# Patient Record
Sex: Female | Born: 1975 | Race: Black or African American | Hispanic: No | Smoking: Never smoker
Health system: Southern US, Community
[De-identification: ages and names within clinical notes are randomized; demographics above are authoritative.]

## PROBLEM LIST (undated history)

## (undated) DIAGNOSIS — R739 Hyperglycemia, unspecified: Secondary | ICD-10-CM

## (undated) DIAGNOSIS — E785 Hyperlipidemia, unspecified: Secondary | ICD-10-CM

## (undated) DIAGNOSIS — Z992 Dependence on renal dialysis: Secondary | ICD-10-CM

## (undated) DIAGNOSIS — R5383 Other fatigue: Secondary | ICD-10-CM

## (undated) DIAGNOSIS — E119 Type 2 diabetes mellitus without complications: Secondary | ICD-10-CM

## (undated) DIAGNOSIS — R609 Edema, unspecified: Secondary | ICD-10-CM

## (undated) DIAGNOSIS — I5032 Chronic diastolic (congestive) heart failure: Secondary | ICD-10-CM

## (undated) DIAGNOSIS — N186 End stage renal disease: Secondary | ICD-10-CM

## (undated) DIAGNOSIS — I10 Essential (primary) hypertension: Secondary | ICD-10-CM

## (undated) HISTORY — DX: Essential (primary) hypertension: I10

## (undated) HISTORY — DX: Hyperglycemia, unspecified: R73.9

## (undated) HISTORY — DX: Hyperlipidemia, unspecified: E78.5

## (undated) HISTORY — PX: INSERTION OF DIALYSIS CATHETER: SHX1324

## (undated) HISTORY — DX: Edema, unspecified: R60.9

## (undated) HISTORY — PX: OTHER SURGICAL HISTORY: SHX169

## (undated) HISTORY — DX: Other fatigue: R53.83

---

## 2004-07-05 ENCOUNTER — Ambulatory Visit (HOSPITAL_COMMUNITY): Admission: RE | Admit: 2004-07-05 | Discharge: 2004-07-05 | Payer: Self-pay | Admitting: Gynecology

## 2004-07-31 ENCOUNTER — Ambulatory Visit (HOSPITAL_COMMUNITY): Admission: RE | Admit: 2004-07-31 | Discharge: 2004-07-31 | Payer: Self-pay | Admitting: Gynecology

## 2004-10-03 ENCOUNTER — Ambulatory Visit (HOSPITAL_COMMUNITY): Admission: RE | Admit: 2004-10-03 | Discharge: 2004-10-03 | Payer: Self-pay | Admitting: Gynecology

## 2004-12-03 ENCOUNTER — Ambulatory Visit (HOSPITAL_COMMUNITY): Admission: RE | Admit: 2004-12-03 | Discharge: 2004-12-03 | Payer: Self-pay | Admitting: Gynecology

## 2005-01-20 ENCOUNTER — Ambulatory Visit (HOSPITAL_COMMUNITY): Admission: RE | Admit: 2005-01-20 | Discharge: 2005-01-20 | Payer: Self-pay | Admitting: Gynecology

## 2005-01-31 ENCOUNTER — Inpatient Hospital Stay (HOSPITAL_COMMUNITY): Admission: RE | Admit: 2005-01-31 | Discharge: 2005-02-03 | Payer: Self-pay | Admitting: Gynecology

## 2007-10-03 ENCOUNTER — Emergency Department: Payer: Self-pay | Admitting: Emergency Medicine

## 2008-12-16 ENCOUNTER — Inpatient Hospital Stay: Payer: Self-pay | Admitting: Internal Medicine

## 2010-12-28 ENCOUNTER — Encounter: Payer: Self-pay | Admitting: Gynecology

## 2011-10-17 ENCOUNTER — Ambulatory Visit: Payer: Self-pay | Admitting: Ophthalmology

## 2013-07-10 ENCOUNTER — Emergency Department: Payer: Self-pay | Admitting: Emergency Medicine

## 2013-07-10 LAB — URINALYSIS, COMPLETE
Bilirubin,UR: NEGATIVE
Glucose,UR: >=500 mg/dL (ref 0–75)
Leukocyte Esterase: NEGATIVE
Nitrite: NEGATIVE
Ph: 5 (ref 4.5–8.0)
Protein: 100
RBC,UR: 1 /HPF (ref 0–5)
Specific Gravity: 1.02 (ref 1.003–1.030)
Squamous Epithelial: 1
WBC UR: 2 /HPF (ref 0–5)

## 2013-07-10 LAB — COMPREHENSIVE METABOLIC PANEL
Albumin: 3.1 g/dL — ABNORMAL LOW (ref 3.4–5.0)
Alkaline Phosphatase: 92 U/L (ref 50–136)
Anion Gap: 6 — ABNORMAL LOW (ref 7–16)
BUN: 17 mg/dL (ref 7–18)
Bilirubin,Total: 0.4 mg/dL (ref 0.2–1.0)
Calcium, Total: 9.3 mg/dL (ref 8.5–10.1)
Chloride: 101 mmol/L (ref 98–107)
Co2: 27 mmol/L (ref 21–32)
Creatinine: 1.01 mg/dL (ref 0.60–1.30)
EGFR (African American): >60
EGFR (Non-African Amer.): >60
Glucose: 387 mg/dL — ABNORMAL HIGH (ref 65–99)
Osmolality: 286 (ref 275–301)
Potassium: 3.9 mmol/L (ref 3.5–5.1)
SGOT(AST): 26 U/L (ref 15–37)
SGPT (ALT): 28 U/L (ref 12–78)
Sodium: 134 mmol/L — ABNORMAL LOW (ref 136–145)
Total Protein: 7.3 g/dL (ref 6.4–8.2)

## 2013-07-10 LAB — CBC
HCT: 32.7 % — ABNORMAL LOW (ref 35.0–47.0)
HGB: 11.5 g/dL — ABNORMAL LOW (ref 12.0–16.0)
MCH: 28.9 pg (ref 26.0–34.0)
MCHC: 35.2 g/dL (ref 32.0–36.0)
MCV: 82 fL (ref 80–100)
Platelet: 253 10*3/uL (ref 150–440)
RBC: 3.98 10*6/uL (ref 3.80–5.20)
RDW: 13 % (ref 11.5–14.5)
WBC: 6.8 10*3/uL (ref 3.6–11.0)

## 2014-08-01 ENCOUNTER — Emergency Department: Payer: Self-pay | Admitting: Emergency Medicine

## 2014-08-10 ENCOUNTER — Ambulatory Visit: Payer: Self-pay | Admitting: Chiropractor

## 2014-08-13 ENCOUNTER — Emergency Department: Payer: Self-pay | Admitting: Emergency Medicine

## 2014-08-13 LAB — CBC WITH DIFFERENTIAL/PLATELET
Basophil #: 0.1 10*3/uL (ref 0.0–0.1)
Basophil %: 0.9 %
Eosinophil #: 0.1 10*3/uL (ref 0.0–0.7)
Eosinophil %: 1.1 %
HCT: 36.3 % (ref 35.0–47.0)
HGB: 11.8 g/dL — ABNORMAL LOW (ref 12.0–16.0)
Lymphocyte #: 2.3 10*3/uL (ref 1.0–3.6)
Lymphocyte %: 24.2 %
MCH: 27.2 pg (ref 26.0–34.0)
MCHC: 32.5 g/dL (ref 32.0–36.0)
MCV: 84 fL (ref 80–100)
Monocyte #: 0.6 x10 3/mm (ref 0.2–0.9)
Monocyte %: 6.3 %
Neutrophil #: 6.5 10*3/uL (ref 1.4–6.5)
Neutrophil %: 67.5 %
Platelet: 402 10*3/uL (ref 150–440)
RBC: 4.34 10*6/uL (ref 3.80–5.20)
RDW: 12.9 % (ref 11.5–14.5)
WBC: 9.6 10*3/uL (ref 3.6–11.0)

## 2014-08-13 LAB — BASIC METABOLIC PANEL
Anion Gap: 8 (ref 7–16)
BUN: 20 mg/dL — ABNORMAL HIGH (ref 7–18)
Calcium, Total: 9.3 mg/dL (ref 8.5–10.1)
Chloride: 100 mmol/L (ref 98–107)
Co2: 25 mmol/L (ref 21–32)
Creatinine: 1.22 mg/dL (ref 0.60–1.30)
EGFR (African American): >60
EGFR (Non-African Amer.): 56 — ABNORMAL LOW
Glucose: 365 mg/dL — ABNORMAL HIGH (ref 65–99)
Osmolality: 284 (ref 275–301)
Potassium: 3.7 mmol/L (ref 3.5–5.1)
Sodium: 133 mmol/L — ABNORMAL LOW (ref 136–145)

## 2014-08-18 LAB — CULTURE, BLOOD (SINGLE)

## 2014-08-24 ENCOUNTER — Encounter: Payer: Self-pay | Admitting: Surgery

## 2014-09-07 ENCOUNTER — Encounter: Payer: Self-pay | Admitting: Surgery

## 2014-09-08 ENCOUNTER — Encounter (HOSPITAL_BASED_OUTPATIENT_CLINIC_OR_DEPARTMENT_OTHER): Payer: Self-pay | Attending: General Surgery

## 2015-02-12 ENCOUNTER — Other Ambulatory Visit: Payer: Self-pay

## 2015-02-13 ENCOUNTER — Other Ambulatory Visit (INDEPENDENT_AMBULATORY_CARE_PROVIDER_SITE_OTHER): Payer: Medicaid Other

## 2015-02-13 ENCOUNTER — Other Ambulatory Visit: Payer: Self-pay

## 2015-02-13 ENCOUNTER — Other Ambulatory Visit: Payer: Self-pay | Admitting: Radiology

## 2015-02-13 DIAGNOSIS — R0602 Shortness of breath: Secondary | ICD-10-CM

## 2015-02-13 DIAGNOSIS — R609 Edema, unspecified: Secondary | ICD-10-CM

## 2015-03-08 ENCOUNTER — Ambulatory Visit (INDEPENDENT_AMBULATORY_CARE_PROVIDER_SITE_OTHER): Payer: Medicaid Other | Admitting: Cardiovascular Disease

## 2015-03-08 ENCOUNTER — Encounter: Payer: Self-pay | Admitting: *Deleted

## 2015-03-08 ENCOUNTER — Encounter: Payer: Self-pay | Admitting: Cardiovascular Disease

## 2015-03-08 VITALS — BP 184/98 | HR 100 | Ht 63.5 in | Wt 180.2 lb

## 2015-03-08 DIAGNOSIS — R9439 Abnormal result of other cardiovascular function study: Secondary | ICD-10-CM

## 2015-03-08 DIAGNOSIS — I10 Essential (primary) hypertension: Secondary | ICD-10-CM

## 2015-03-08 DIAGNOSIS — I5032 Chronic diastolic (congestive) heart failure: Secondary | ICD-10-CM | POA: Diagnosis not present

## 2015-03-08 MED ORDER — CARVEDILOL 6.25 MG PO TABS: 6.2500 mg | ORAL_TABLET | Freq: Two times a day (BID) | ORAL | Status: DC

## 2015-03-08 NOTE — Patient Instructions (Signed)
Your physician has recommended you make the following change in your medication: Start Coreg ( 6.25 mg ) twice a day.   Your physician has requested that you have a renal artery duplex. During this test, an ultrasound is used to evaluate blood flow to the kidneys. Allow one hour for this exam. Do not eat after midnight the day before and avoid carbonated beverages. Take your medications as you usually do.   Your physician recommends that you schedule a follow-up appointment in: 1 month after completion of test.

## 2015-03-12 DIAGNOSIS — I5032 Chronic diastolic (congestive) heart failure: Secondary | ICD-10-CM | POA: Insufficient documentation

## 2015-03-12 DIAGNOSIS — I10 Essential (primary) hypertension: Secondary | ICD-10-CM | POA: Insufficient documentation

## 2015-03-12 NOTE — Assessment & Plan Note (Signed)
Blood pressure is significantly elevated. I added carvedilol as outlined above. I requested renal artery duplex to evaluate for possible renal artery stenosis.

## 2015-03-12 NOTE — Assessment & Plan Note (Signed)
The patient has significant exertional dyspnea. I suspect that this is likely multifactorial with some element of chronic diastolic heart failure. However, there might be some other etiologies for this especially kidney disease given her abnormal creatinine and low albumin. Echocardiogram did show trivial pericardial effusion and bilateral pleural effusions. There is a possibility of significant proteinuria. She is going to see nephrology. Coronary artery disease has not been fully excluded as a reason for her exertional dyspnea given her abnormal EKG and suboptimal stress test. She has multiple risk factors for coronary artery disease. She might require cardiac catheterization in the near future. However, I would like for her blood pressure to be more controlled and also to have her evaluated by nephrology. I added carvedilol 6.25 mg twice daily to her medications.

## 2015-03-12 NOTE — Progress Notes (Signed)
Primary care physician: Dr. Dario Guardian  HPI  This is a 39 year old female who was referred for evaluation of exertional dyspnea and leg edema. She has known history of hypertension, diabetes since she was 39 years old, chronic kidney disease, hypothyroidism and hyper-lipidemia. She has family history of coronary artery disease. Her father died at the age of 36 of myocardial infarction. She is not a smoker and does not drink alcohol. She was noted to have a creatinine of 1.5 and she is going to see nephrology. She was also anemic with a hemoglobin of 10.7. Total protein was low at 5.8 with an albumin of 2.6. She had significant worsening of exertional dyspnea with leg edema. She underwent a treadmill nuclear stress test. It showed no clear evidence of ischemia with ejection fraction of 57%. However, the test was suboptimal due to low level of exercise, hypertensive response and underlying left bundle branch block. She had an echocardiogram last month in our office which showed an ejection fraction of 50-55%, grade 1 diastolic dysfunction, mildly dilated left atrium, trivial pericardial effusion with bilateral pleural effusions.  No Known Allergies   No current outpatient prescriptions on file prior to visit.   No current facility-administered medications on file prior to visit.     Past Medical History  Diagnosis Date  . Edema   . Hyperlipidemia   . Hyperglycemia   . Fatigue   . Irregular heart beat   . Hypertension      History reviewed. No pertinent past surgical history.   Family History  Problem Relation Age of Onset  . Hypertension Mother   . Diabetes Mother   . Hypertension Father   . CVA Father      History   Social History  . Marital Status: Divorced    Spouse Name: N/A  . Number of Children: N/A  . Years of Education: N/A   Occupational History  . Not on file.   Social History Main Topics  . Smoking status: Never Smoker   . Smokeless tobacco: Not on file  .  Alcohol Use: No  . Drug Use: No  . Sexual Activity: Not on file   Other Topics Concern  . Not on file   Social History Narrative     ROS A 10 point review of system was performed. It is negative other than that mentioned in the history of present illness.   PHYSICAL EXAM   BP 184/98 mmHg  Pulse 100  Ht 5' 3.5" (1.613 m)  Wt 180 lb 4 oz (81.761 kg)  BMI 31.43 kg/m2 Constitutional: She is oriented to person, place, and time. She appears well-developed and well-nourished. No distress.  HENT: No nasal discharge.  Head: Normocephalic and atraumatic.  Eyes: Pupils are equal and round. No discharge.  Neck: Normal range of motion. Neck supple. No JVD present. No thyromegaly present.  Cardiovascular: Normal rate, regular rhythm, normal heart sounds. Exam reveals no gallop and no friction rub. No murmur heard.  Pulmonary/Chest: Effort normal and breath sounds normal. No stridor. No respiratory distress. She has no wheezes. She has no rales. She exhibits no tenderness.  Abdominal: Soft. Bowel sounds are normal. She exhibits no distension. There is no tenderness. There is no rebound and no guarding.  Musculoskeletal: Normal range of motion. She exhibits +1 edema and no tenderness.  Neurological: She is alert and oriented to person, place, and time. Coordination normal.  Skin: Skin is warm and dry. No rash noted. She is not diaphoretic. No erythema. No  pallor.  Psychiatric: She has a normal mood and affect. Her behavior is normal. Judgment and thought content normal.     ZOX:WRUEAEKG:Sinus  Rhythm  Diffuse low voltage.   -Intraventricular conduction defect and left axis -possible anterior fascicular block   consider ventricular hypertrophy.   -Poor R-wave progression -may be secondary to pulmonary disease   consider old anterior infarct.    ASSESSMENT AND PLAN

## 2015-04-12 ENCOUNTER — Encounter: Payer: Self-pay | Admitting: *Deleted

## 2015-04-13 ENCOUNTER — Telehealth: Payer: Self-pay | Admitting: Cardiovascular Disease

## 2015-04-16 ENCOUNTER — Encounter: Payer: Self-pay | Admitting: *Deleted

## 2015-04-16 ENCOUNTER — Ambulatory Visit: Payer: Medicaid Other | Admitting: Cardiovascular Disease

## 2015-10-10 ENCOUNTER — Inpatient Hospital Stay
Admission: EM | Admit: 2015-10-10 | Discharge: 2015-11-02 | DRG: 291 | Disposition: A | Discharge status: Home / Self Care | Payer: Medicaid Other | Attending: Internal Medicine | Admitting: Internal Medicine

## 2015-10-10 ENCOUNTER — Emergency Department: Payer: Medicaid Other

## 2015-10-10 ENCOUNTER — Encounter: Payer: Self-pay | Admitting: Emergency Medicine

## 2015-10-10 ENCOUNTER — Inpatient Hospital Stay: Payer: Medicaid Other

## 2015-10-10 DIAGNOSIS — R188 Other ascites: Secondary | ICD-10-CM | POA: Diagnosis present

## 2015-10-10 DIAGNOSIS — Z823 Family history of stroke: Secondary | ICD-10-CM

## 2015-10-10 DIAGNOSIS — N179 Acute kidney failure, unspecified: Secondary | ICD-10-CM

## 2015-10-10 DIAGNOSIS — I132 Hypertensive heart and chronic kidney disease with heart failure and with stage 5 chronic kidney disease, or end stage renal disease: Principal | ICD-10-CM | POA: Diagnosis present

## 2015-10-10 DIAGNOSIS — E1059 Type 1 diabetes mellitus with other circulatory complications: Secondary | ICD-10-CM | POA: Diagnosis not present

## 2015-10-10 DIAGNOSIS — R531 Weakness: Secondary | ICD-10-CM | POA: Diagnosis present

## 2015-10-10 DIAGNOSIS — E1022 Type 1 diabetes mellitus with diabetic chronic kidney disease: Secondary | ICD-10-CM | POA: Diagnosis present

## 2015-10-10 DIAGNOSIS — J40 Bronchitis, not specified as acute or chronic: Secondary | ICD-10-CM | POA: Diagnosis present

## 2015-10-10 DIAGNOSIS — E1021 Type 1 diabetes mellitus with diabetic nephropathy: Secondary | ICD-10-CM | POA: Diagnosis present

## 2015-10-10 DIAGNOSIS — I472 Ventricular tachycardia: Secondary | ICD-10-CM | POA: Diagnosis present

## 2015-10-10 DIAGNOSIS — Z8249 Family history of ischemic heart disease and other diseases of the circulatory system: Secondary | ICD-10-CM | POA: Diagnosis not present

## 2015-10-10 DIAGNOSIS — I5023 Acute on chronic systolic (congestive) heart failure: Secondary | ICD-10-CM | POA: Diagnosis not present

## 2015-10-10 DIAGNOSIS — L89510 Pressure ulcer of right ankle, unstageable: Secondary | ICD-10-CM | POA: Diagnosis present

## 2015-10-10 DIAGNOSIS — J384 Edema of larynx: Secondary | ICD-10-CM | POA: Diagnosis present

## 2015-10-10 DIAGNOSIS — I340 Nonrheumatic mitral (valve) insufficiency: Secondary | ICD-10-CM | POA: Diagnosis not present

## 2015-10-10 DIAGNOSIS — E10622 Type 1 diabetes mellitus with other skin ulcer: Secondary | ICD-10-CM | POA: Diagnosis present

## 2015-10-10 DIAGNOSIS — R32 Unspecified urinary incontinence: Secondary | ICD-10-CM | POA: Diagnosis present

## 2015-10-10 DIAGNOSIS — Z833 Family history of diabetes mellitus: Secondary | ICD-10-CM

## 2015-10-10 DIAGNOSIS — I872 Venous insufficiency (chronic) (peripheral): Secondary | ICD-10-CM | POA: Diagnosis present

## 2015-10-10 DIAGNOSIS — E039 Hypothyroidism, unspecified: Secondary | ICD-10-CM | POA: Diagnosis present

## 2015-10-10 DIAGNOSIS — I469 Cardiac arrest, cause unspecified: Secondary | ICD-10-CM | POA: Diagnosis not present

## 2015-10-10 DIAGNOSIS — R601 Generalized edema: Secondary | ICD-10-CM | POA: Insufficient documentation

## 2015-10-10 DIAGNOSIS — D509 Iron deficiency anemia, unspecified: Secondary | ICD-10-CM | POA: Diagnosis present

## 2015-10-10 DIAGNOSIS — D631 Anemia in chronic kidney disease: Secondary | ICD-10-CM | POA: Diagnosis present

## 2015-10-10 DIAGNOSIS — L89329 Pressure ulcer of left buttock, unspecified stage: Secondary | ICD-10-CM | POA: Diagnosis present

## 2015-10-10 DIAGNOSIS — E1065 Type 1 diabetes mellitus with hyperglycemia: Secondary | ICD-10-CM | POA: Diagnosis present

## 2015-10-10 DIAGNOSIS — N186 End stage renal disease: Secondary | ICD-10-CM | POA: Diagnosis present

## 2015-10-10 DIAGNOSIS — I5031 Acute diastolic (congestive) heart failure: Secondary | ICD-10-CM | POA: Diagnosis not present

## 2015-10-10 DIAGNOSIS — R404 Transient alteration of awareness: Secondary | ICD-10-CM | POA: Diagnosis not present

## 2015-10-10 DIAGNOSIS — I5033 Acute on chronic diastolic (congestive) heart failure: Secondary | ICD-10-CM | POA: Insufficient documentation

## 2015-10-10 DIAGNOSIS — R4182 Altered mental status, unspecified: Secondary | ICD-10-CM

## 2015-10-10 DIAGNOSIS — E10649 Type 1 diabetes mellitus with hypoglycemia without coma: Secondary | ICD-10-CM | POA: Diagnosis present

## 2015-10-10 DIAGNOSIS — J9621 Acute and chronic respiratory failure with hypoxia: Secondary | ICD-10-CM | POA: Diagnosis present

## 2015-10-10 DIAGNOSIS — I4901 Ventricular fibrillation: Secondary | ICD-10-CM | POA: Diagnosis present

## 2015-10-10 DIAGNOSIS — I509 Heart failure, unspecified: Secondary | ICD-10-CM | POA: Diagnosis not present

## 2015-10-10 DIAGNOSIS — Z978 Presence of other specified devices: Secondary | ICD-10-CM

## 2015-10-10 DIAGNOSIS — Z794 Long term (current) use of insulin: Secondary | ICD-10-CM

## 2015-10-10 DIAGNOSIS — E785 Hyperlipidemia, unspecified: Secondary | ICD-10-CM | POA: Diagnosis present

## 2015-10-10 DIAGNOSIS — I5043 Acute on chronic combined systolic (congestive) and diastolic (congestive) heart failure: Secondary | ICD-10-CM

## 2015-10-10 DIAGNOSIS — N049 Nephrotic syndrome with unspecified morphologic changes: Secondary | ICD-10-CM | POA: Diagnosis present

## 2015-10-10 DIAGNOSIS — J9601 Acute respiratory failure with hypoxia: Secondary | ICD-10-CM

## 2015-10-10 DIAGNOSIS — I10 Essential (primary) hypertension: Secondary | ICD-10-CM | POA: Insufficient documentation

## 2015-10-10 DIAGNOSIS — J90 Pleural effusion, not elsewhere classified: Secondary | ICD-10-CM

## 2015-10-10 DIAGNOSIS — J9690 Respiratory failure, unspecified, unspecified whether with hypoxia or hypercapnia: Secondary | ICD-10-CM

## 2015-10-10 HISTORY — DX: Type 2 diabetes mellitus without complications: E11.9

## 2015-10-10 LAB — CREATININE, SERUM
Creatinine, Ser: 3.11 mg/dL — ABNORMAL HIGH (ref 0.44–1.00)
GFR calc non Af Amer: 17 mL/min — ABNORMAL LOW (ref >60)
GFR, EST AFRICAN AMERICAN: 20 mL/min — AB (ref >60)

## 2015-10-10 LAB — CBC
HEMATOCRIT: 31.1 % — AB (ref 35.0–47.0)
HEMOGLOBIN: 9.8 g/dL — AB (ref 12.0–16.0)
MCH: 27.9 pg (ref 26.0–34.0)
MCHC: 31.6 g/dL — ABNORMAL LOW (ref 32.0–36.0)
MCV: 88.1 fL (ref 80.0–100.0)
Platelets: 425 10*3/uL (ref 150–440)
RBC: 3.52 MIL/uL — AB (ref 3.80–5.20)
RDW: 16.6 % — ABNORMAL HIGH (ref 11.5–14.5)
WBC: 6.5 10*3/uL (ref 3.6–11.0)

## 2015-10-10 LAB — CBC WITH DIFFERENTIAL/PLATELET
Basophils Absolute: 0.1 10*3/uL (ref 0–0.1)
Basophils Relative: 1 %
Eosinophils Absolute: 0.1 10*3/uL (ref 0–0.7)
Eosinophils Relative: 2 %
HCT: 31 % — ABNORMAL LOW (ref 35.0–47.0)
Hemoglobin: 9.8 g/dL — ABNORMAL LOW (ref 12.0–16.0)
Lymphocytes Relative: 15 %
Lymphs Abs: 1 10*3/uL (ref 1.0–3.6)
MCH: 27.9 pg (ref 26.0–34.0)
MCHC: 31.7 g/dL — ABNORMAL LOW (ref 32.0–36.0)
MCV: 88.1 fL (ref 80.0–100.0)
Monocytes Absolute: 0.3 10*3/uL (ref 0.2–0.9)
Monocytes Relative: 5 %
Neutro Abs: 4.9 10*3/uL (ref 1.4–6.5)
Neutrophils Relative %: 77 %
Platelets: 410 10*3/uL (ref 150–440)
RBC: 3.52 MIL/uL — ABNORMAL LOW (ref 3.80–5.20)
RDW: 16.3 % — ABNORMAL HIGH (ref 11.5–14.5)
WBC: 6.4 10*3/uL (ref 3.6–11.0)

## 2015-10-10 LAB — HEPATIC FUNCTION PANEL
ALBUMIN: 2.2 g/dL — AB (ref 3.5–5.0)
ALK PHOS: 117 U/L (ref 38–126)
ALT: 37 U/L (ref 14–54)
AST: 30 U/L (ref 15–41)
Bilirubin, Direct: <0.1 mg/dL — ABNORMAL LOW (ref 0.1–0.5)
TOTAL PROTEIN: 6.2 g/dL — AB (ref 6.5–8.1)
Total Bilirubin: 0.5 mg/dL (ref 0.3–1.2)

## 2015-10-10 LAB — TSH: TSH: 4.743 u[IU]/mL — ABNORMAL HIGH (ref 0.350–4.500)

## 2015-10-10 LAB — BASIC METABOLIC PANEL
Anion gap: 8 (ref 5–15)
BUN: 36 mg/dL — ABNORMAL HIGH (ref 6–20)
CO2: 19 mmol/L — ABNORMAL LOW (ref 22–32)
Calcium: 8.6 mg/dL — ABNORMAL LOW (ref 8.9–10.3)
Chloride: 111 mmol/L (ref 101–111)
Creatinine, Ser: 3.16 mg/dL — ABNORMAL HIGH (ref 0.44–1.00)
GFR calc Af Amer: 20 mL/min — ABNORMAL LOW (ref >60)
GFR calc non Af Amer: 17 mL/min — ABNORMAL LOW (ref >60)
Glucose, Bld: 194 mg/dL — ABNORMAL HIGH (ref 65–99)
Potassium: 4.7 mmol/L (ref 3.5–5.1)
Sodium: 138 mmol/L (ref 135–145)

## 2015-10-10 LAB — TROPONIN I
Troponin I: <0.03 ng/mL (ref <0.031)
Troponin I: 0.03 ng/mL (ref <0.031)

## 2015-10-10 LAB — MAGNESIUM: Magnesium: 2.1 mg/dL (ref 1.7–2.4)

## 2015-10-10 LAB — CALCIUM: Calcium: 8.7 mg/dL — ABNORMAL LOW (ref 8.9–10.3)

## 2015-10-10 MED ORDER — ONDANSETRON HCL 4 MG PO TABS: 4.0000 mg | ORAL_TABLET | Freq: Four times a day (QID) | ORAL | Status: DC | PRN

## 2015-10-10 MED ORDER — HYDRALAZINE HCL 20 MG/ML IJ SOLN: 10.0000 mg | Freq: Four times a day (QID) | INTRAMUSCULAR | Status: DC | PRN

## 2015-10-10 MED ORDER — INSULIN GLARGINE 100 UNIT/ML ~~LOC~~ SOLN
12.0000 [IU] | Freq: Every day | SUBCUTANEOUS | Status: DC
  Administered 2015-10-11 – 2015-10-16 (×4): 12 [IU] via SUBCUTANEOUS
  Filled 2015-10-10 (×10): qty 0.12

## 2015-10-10 MED ORDER — SODIUM CHLORIDE 0.9 % IV SOLN: 250.0000 mL | INTRAVENOUS | Status: DC | PRN

## 2015-10-10 MED ORDER — ACETAMINOPHEN 325 MG PO TABS
650.0000 mg | ORAL_TABLET | Freq: Four times a day (QID) | ORAL | Status: DC | PRN
  Administered 2015-10-19: 650 mg via ORAL
  Filled 2015-10-10: qty 2

## 2015-10-10 MED ORDER — HEPARIN SODIUM (PORCINE) 5000 UNIT/ML IJ SOLN
5000.0000 [IU] | Freq: Three times a day (TID) | INTRAMUSCULAR | Status: DC
  Administered 2015-10-10 – 2015-10-11 (×2): 5000 [IU] via SUBCUTANEOUS
  Filled 2015-10-10 (×2): qty 1

## 2015-10-10 MED ORDER — SODIUM CHLORIDE 0.9 % IJ SOLN
3.0000 mL | Freq: Two times a day (BID) | INTRAMUSCULAR | Status: DC
  Administered 2015-10-10 – 2015-10-11 (×2): 3 mL via INTRAVENOUS

## 2015-10-10 MED ORDER — FUROSEMIDE 10 MG/ML IJ SOLN
40.0000 mg | Freq: Two times a day (BID) | INTRAMUSCULAR | Status: DC
  Administered 2015-10-11: 40 mg via INTRAVENOUS
  Filled 2015-10-10: qty 4

## 2015-10-10 MED ORDER — ACETAMINOPHEN 650 MG RE SUPP: 650.0000 mg | Freq: Four times a day (QID) | RECTAL | Status: DC | PRN

## 2015-10-10 MED ORDER — ONDANSETRON HCL 4 MG/2ML IJ SOLN: 4.0000 mg | Freq: Four times a day (QID) | INTRAMUSCULAR | Status: DC | PRN

## 2015-10-10 MED ORDER — NITROGLYCERIN 2 % TD OINT: 1.0000 [in_us] | TOPICAL_OINTMENT | Freq: Four times a day (QID) | TRANSDERMAL | Status: DC

## 2015-10-10 MED ORDER — CARVEDILOL 6.25 MG PO TABS
12.5000 mg | ORAL_TABLET | Freq: Two times a day (BID) | ORAL | Status: DC
  Administered 2015-10-11 – 2015-10-19 (×16): 12.5 mg via ORAL
  Filled 2015-10-10: qty 1
  Filled 2015-10-10: qty 2
  Filled 2015-10-10: qty 1
  Filled 2015-10-10: qty 2
  Filled 2015-10-10: qty 1
  Filled 2015-10-10: qty 2
  Filled 2015-10-10 (×7): qty 1
  Filled 2015-10-10: qty 2
  Filled 2015-10-10: qty 1
  Filled 2015-10-10 (×2): qty 2

## 2015-10-10 MED ORDER — FUROSEMIDE 10 MG/ML IJ SOLN
60.0000 mg | Freq: Once | INTRAMUSCULAR | Status: AC
  Administered 2015-10-10: 60 mg via INTRAVENOUS
  Filled 2015-10-10: qty 8

## 2015-10-10 MED ORDER — SODIUM CHLORIDE 0.9 % IJ SOLN
3.0000 mL | INTRAMUSCULAR | Status: DC | PRN
  Administered 2015-10-11 – 2015-10-31 (×2): 3 mL via INTRAVENOUS
  Filled 2015-10-10 (×2): qty 10

## 2015-10-10 MED ORDER — INSULIN ASPART 100 UNIT/ML ~~LOC~~ SOLN
0.0000 [IU] | Freq: Three times a day (TID) | SUBCUTANEOUS | Status: DC
Start: 2015-10-11 — End: 2015-10-17
  Administered 2015-10-11 – 2015-10-12 (×3): 1 [IU] via SUBCUTANEOUS
  Administered 2015-10-12 – 2015-10-13 (×3): 2 [IU] via SUBCUTANEOUS
  Administered 2015-10-14: 1 [IU] via SUBCUTANEOUS
  Administered 2015-10-14: 2 [IU] via SUBCUTANEOUS
  Administered 2015-10-14: 1 [IU] via SUBCUTANEOUS
  Administered 2015-10-15: 3 [IU] via SUBCUTANEOUS
  Administered 2015-10-15: 2 [IU] via SUBCUTANEOUS
  Administered 2015-10-16 (×2): 1 [IU] via SUBCUTANEOUS
  Filled 2015-10-10: qty 1
  Filled 2015-10-10: qty 5
  Filled 2015-10-10: qty 2
  Filled 2015-10-10: qty 1
  Filled 2015-10-10 (×2): qty 2
  Filled 2015-10-10: qty 1
  Filled 2015-10-10 (×2): qty 2
  Filled 2015-10-10 (×3): qty 1
  Filled 2015-10-10: qty 3

## 2015-10-10 MED ORDER — NITROGLYCERIN 2 % TD OINT
1.0000 [in_us] | TOPICAL_OINTMENT | Freq: Four times a day (QID) | TRANSDERMAL | Status: DC
  Administered 2015-10-10 – 2015-10-12 (×7): 1 [in_us] via TOPICAL
  Filled 2015-10-10 (×7): qty 1

## 2015-10-10 MED ORDER — SODIUM CHLORIDE 0.9 % IJ SOLN
3.0000 mL | Freq: Two times a day (BID) | INTRAMUSCULAR | Status: DC
Start: 2015-10-10 — End: 2015-10-14

## 2015-10-10 NOTE — ED Notes (Signed)
MD at bedside. 

## 2015-10-10 NOTE — ED Notes (Signed)
Resting at present. Friend at bedside

## 2015-10-10 NOTE — H&P (Addendum)
[facility] [place] [facility]   PATIENT NAME: Karen Rich    MR#:  [id number]  DATE OF BIRTH:  10-01-1976  DATE OF ADMISSION:  10/10/2015  PRIMARY CARE PHYSICIAN: Sherrie Mustache, MD   REQUESTING/REFERRING PHYSICIAN:   CHIEF COMPLAINT:   Chief Complaint  Patient presents with  . Leg Swelling  . Ascites    HISTORY OF PRESENT ILLNESS: Cydne Grahn  is a 39 y.o. female with a known history of  diabetes type 2, hyperlipidemia, hypertension, chronic kidney disease stage III who presents to the ED with complaint of swelling of her body diffusely. She reports that she stop taking all her medication including her insulin a few months ago. She also reports that she's been having shortness of breath ongoing for the past few months and is unable to lay flat and has to sleep in a chair. She denies any chest pain fevers or chills. Denies any nausea vomiting or diarrhea        PAST MEDICAL HISTORY:   Past Medical History  Diagnosis Date  . Edema   . Hyperlipidemia   . Hyperglycemia   . Fatigue   . Irregular heart beat   . Hypertension   . Diabetes mellitus without complication (HCC)   . Renal disorder   . Renal failure     stage 3    PAST SURGICAL HISTORY:  Past Surgical History  Procedure Laterality Date  . None      SOCIAL HISTORY:  Social History  Substance Use Topics  . Smoking status: Never Smoker   . Smokeless tobacco: Not on file  . Alcohol Use: No    FAMILY HISTORY:  Family History  Problem Relation Age of Onset  . Hypertension Mother   . Diabetes Mother   . Hypertension Father   . CVA Father     DRUG ALLERGIES: No Known Allergies  REVIEW OF SYSTEMS:   CONSTITUTIONAL: No fever, positive fatigue and weakness.  EYES: No blurred or double vision.  EARS, NOSE, AND THROAT: No tinnitus or ear pain.  RESPIRATORY: No cough, shortness of breath, wheezing or hemoptysis.  CARDIOVASCULAR: No chest pain, positive orthopnea and  edema.  GASTROINTESTINAL: No nausea, vomiting, diarrhea or abdominal pain.  GENITOURINARY: No dysuria, hematuria.  ENDOCRINE: Positive polyuria and nocturia,  HEMATOLOGY: No anemia, easy bruising or bleeding SKIN: No rash or lesion. MUSCULOSKELETAL: No joint pain or arthritis.   NEUROLOGIC: No tingling, numbness, weakness.  PSYCHIATRY: No anxiety or depression.   MEDICATIONS AT HOME: Patient not taking any medications for the past few months Prior to Admission medications   Not on File      PHYSICAL EXAMINATION:   VITAL SIGNS: Blood pressure 155/110, pulse 95, temperature 97.5 F (36.4 C), temperature source Oral, resp. rate 18, last menstrual period 09/08/2015, SpO2 96 %.  GENERAL:  39 y.o.-year-old patient lying in the bed with no acute distress.  EYES: Pupils equal, round, reactive to light and accommodation. No scleral icterus. Extraocular muscles intact.  HEENT: Head atraumatic, normocephalic. Oropharynx and nasopharynx clear.  NECK:  Supple, no jugular venous distention. No thyroid enlargement, no tenderness.  LUNGS: Diminished breath sounds without any rales rhonchi wheezing  CARDIOVASCULAR: S1, S2 normal. No murmurs, rubs, or gallops.  ABDOMEN: Soft, nontender, nondistended. Bowel sounds present. No organomegaly or mass.  EXTREMITIES: 2+ pedal edema, cyanosis, or clubbing.  NEUROLOGIC: Cranial nerves II through XII are intact. Muscle strength 5/5 in all extremities. Sensation intact. Gait not checked.  PSYCHIATRIC: The  patient is alert and oriented x 3.  SKIN: No obvious rash, lesion, or ulcer.   LABORATORY PANEL:   CBC  Recent Labs Lab 10/10/15 1446  WBC 6.4  HGB 9.8*  HCT 31.0*  PLT 410  MCV 88.1  MCH 27.9  MCHC 31.7*  RDW 16.3*  LYMPHSABS 1.0  MONOABS 0.3  EOSABS 0.1  BASOSABS 0.1   ------------------------------------------------------------------------------------------------------------------  Chemistries   Recent Labs Lab 10/10/15 1446  NA  138  K 4.7  CL 111  CO2 19*  GLUCOSE 194*  BUN 36*  CREATININE 3.16*  CALCIUM 8.6*   ------------------------------------------------------------------------------------------------------------------ CrCl cannot be calculated (Unknown ideal weight.). ------------------------------------------------------------------------------------------------------------------ No results for input(s): TSH, T4TOTAL, T3FREE, THYROIDAB in the last 72 hours.  Invalid input(s): FREET3   Coagulation profile No results for input(s): INR, PROTIME in the last 168 hours. ------------------------------------------------------------------------------------------------------------------- No results for input(s): DDIMER in the last 72 hours. -------------------------------------------------------------------------------------------------------------------  Cardiac Enzymes  Recent Labs Lab 10/10/15 1446  TROPONINI <0.03   ------------------------------------------------------------------------------------------------------------------ Invalid input(s): POCBNP  ---------------------------------------------------------------------------------------------------------------  Urinalysis    Component Value Date/Time   COLORURINE Red 07/10/2013 1057   APPEARANCEUR Hazy 07/10/2013 1057   LABSPEC 1.020 07/10/2013 1057   PHURINE 5.0 07/10/2013 1057   GLUCOSEU >=500 07/10/2013 1057   HGBUR 1+ 07/10/2013 1057   BILIRUBINUR Negative 07/10/2013 1057   KETONESUR 1+ 07/10/2013 1057   PROTEINUR 100 mg/dL 69/62/952808/02/2013 41321057   NITRITE Negative 07/10/2013 1057   LEUKOCYTESUR Negative 07/10/2013 1057     RADIOLOGY: Dg Chest 2 View  10/10/2015  CLINICAL DATA:  Short of breath and edema EXAM: CHEST  2 VIEW COMPARISON:  07/10/2013 FINDINGS: Lungs are severely under aerated. Small pleural effusions are noted on the lateral view. There is increased opacity towards the lung bases favored represent volume loss. Developing  airspace disease is not excluded. Pulmonary vasculature is within normal limits. IMPRESSION: Low lung volumes. Small bilateral pleural effusions Bibasilar volume loss. Electronically Signed   By: Jolaine ClickArthur  Hoss M.D.   On: 10/10/2015 15:46    EKG: Orders placed or performed during the hospital encounter of 10/10/15  . ED EKG  . ED EKG    IMPRESSION AND PLAN: Patient is a 39 year old African-American female with diabetes type 2 chronic kidney disease presents with anasarca 1. Generalized anasarca could be related to severe hypoalbuminemia albumin level is currently pending, also could be due to acute CHF type unknown, at this time will treat her with IV Lasix. Nephrology evaluation echocardiogram of the heart.  2. Diabetes type 2 not on any medications: Start on sliding scale insulin low-dose Lantus check a hemoglobin A1c  3. Accelerated hypertension: Start patient on Coreg, hydralazine when necessary Nitropatch.  4. Acute renal failure on chronic kidney disease stage III: We'll obtain a renal ultrasound, suspect due to poorly controlled diabetes and progressive renal disease nephrology consult  5. Hypothyroidism check a TSH  6. Lipidemia check a fasting lipid panel in the a.m.  7. Miscellaneous heparin per DVT prophylaxis      All the records are reviewed and case discussed with ED provider. Management plans discussed with the patient, family and they are in agreement.  CODE STATUS: Code full    TOTAL TIME TAKING CARE OF THIS PATIENT: 55 minutes.    Auburn BilberryPATEL, Tannon Peerson M.D on 10/10/2015 at 6:04 PM  Between 7am to 6pm - Pager - [phone]  After 6pm go to [email or web address] - password [email or web address]  [facility]  Office  [phone]  CC: Primary care physician; Sherrie MustacheFayegh Jadali,  MD     

## 2015-10-10 NOTE — ED Provider Notes (Signed)
[facility] Emergency Department Provider Note   ____________________________________________  Time seen: 2:15 PM I have reviewed the triage vital signs and the triage nursing note.  HISTORY  Chief Complaint Leg Swelling and Ascites   Historian Patient  HPI Karen Rich is a 39 y.o. female who reports a history of problems with her kidneys, and last saw her primary care physician in March or April, who is here with a complaint of shortness of breath and lower sure he swelling which seems to have moved up into her abdomen. Patient states she used to be on how to chlorothiazide, however is not on that currently. Over the past 3 weeks she's had increased lower extremity edema and now abdominal edema. Denies chest pain. No cough. Symptoms not made worse or better by anything.    Past Medical History  Diagnosis Date  . Edema   . Hyperlipidemia   . Hyperglycemia   . Fatigue   . Irregular heart beat   . Hypertension   . Diabetes mellitus without complication (HCC)   . Renal disorder   . Renal failure     stage 3    Patient Active Problem List   Diagnosis Date Noted  . Essential hypertension 03/12/2015  . Chronic diastolic heart failure (HCC) 03/12/2015    History reviewed. No pertinent past surgical history.  No current outpatient prescriptions on file.  Allergies Review of patient's allergies indicates no known allergies.  Family History  Problem Relation Age of Onset  . Hypertension Mother   . Diabetes Mother   . Hypertension Father   . CVA Father     Social History Social History  Substance Use Topics  . Smoking status: Never Smoker   . Smokeless tobacco: None  . Alcohol Use: No    Review of Systems  Constitutional: Negative for fever. Eyes: Negative for visual changes. ENT: Negative for sore throat. Cardiovascular: Negative for chest pain. Respiratory: Positive for shortness of breath. Gastrointestinal: Negative for  abdominal pain, vomiting and diarrhea. Genitourinary: Negative for dysuria. Musculoskeletal: Negative for back pain. Skin: Negative for rash. Neurological: Negative for headache. 10 point Review of Systems otherwise negative ____________________________________________   PHYSICAL EXAM:  VITAL SIGNS: ED Triage Vitals  Enc Vitals Group     BP 10/10/15 1411 194/144 mmHg     Pulse Rate 10/10/15 1411 98     Resp --      Temp 10/10/15 1411 97.5 F (36.4 C)     Temp Source 10/10/15 1411 Oral     SpO2 10/10/15 1411 95 %     Weight --      Height --      Head Cir --      Peak Flow --      Pain Score --      Pain Loc --      Pain Edu? --      Excl. in GC? --      Constitutional: Alert and oriented. Well appearing and in no distress. Eyes: Conjunctivae are normal. PERRL. Normal extraocular movements. ENT   Head: Normocephalic and atraumatic.   Nose: No congestion/rhinnorhea.   Mouth/Throat: Mucous membranes are moist.   Neck: No stridor. Cardiovascular/Chest: Normal rate, regular rhythm.  No murmurs, rubs, or gallops. Respiratory: Tachypnea, no wheeze, ronchi.  Moderate decreased air movement throughout all fields Gastrointestinal: Soft. Somewhat distended. Nontender abdomen with palpation.  No guarding, no rebound.  Genitourinary/rectal:Deferred Musculoskeletal: Nontender with normal range of motion in all extremities. No joint  effusions.  No lower extremity tender 4+ lower extremity pitting edema bilateral lower extremities although it up and to the abdomen. Neuro:  Normal speech and language. No gross or focal neurologic deficits are appreciated. Skin:  Skin is warm, dry and intact. No rash noted. Psychiatric: Mood and affect are normal. Speech and behavior are normal. Patient exhibits appropriate insight and judgment.  ____________________________________________   EKG I, Governor Rooksebecca Angeliah Wisdom, MD, the attending physician have personally viewed and interpreted all  ECGs.  96 bpm. Sinus rhythm. Left bundle branch block. ____________________________________________  LABS (pertinent positives/negatives)  Basic metabolic panel significant for BUN 36 and creatinine 3.16 neck sign troponin less than 0.03 CBC significant for hemoglobin 9.8  ____________________________________________  RADIOLOGY All Xrays were viewed by me. Imaging interpreted by Radiologist.   Chest x-ray two-view: IMPRESSION: Low lung volumes.  Small bilateral pleural effusions  Bibasilar volume loss. __________________________________________  PROCEDURES  Procedure(s) performed: None  Critical Care performed: None  ____________________________________________   ED COURSE / ASSESSMENT AND PLAN  CONSULTATIONS: Face to face with hospitalist for admission  Pertinent labs & imaging results that were available during my care of the patient were reviewed by me and considered in my medical decision making (see chart for details).   This patient clinically looks like she is in volume overload consistent with congestive heart failure, including lower shotty edema as well as elevated blood pressure. Patient was given nitroglycerin paste as well as Lasix based on her clinical impression.  Her laboratory studies show she is in acute renal failure. Patient will be admitted for treatment of acute renal failure as well as volume overload state.  Patient / Family / Caregiver informed of clinical course, medical decision-making process, and agree with plan.   ___________________________________________   FINAL CLINICAL IMPRESSION(S) / ED DIAGNOSES   Final diagnoses:  Acute on chronic congestive heart failure, unspecified congestive heart failure type (HCC)  Acute renal failure, unspecified acute renal failure type (HCC)       [name], MD 10/10/15 1730

## 2015-10-10 NOTE — ED Notes (Signed)
Pt arrived by EMS with C/O edema in her legs and stomach. Pt states she is not compliant with her medications other than occasional use of her metformin. She has hx of HTN, Diabetes and stage 3 renal failure for which she was diagnosed in April.

## 2015-10-11 ENCOUNTER — Inpatient Hospital Stay (HOSPITAL_COMMUNITY)
Admit: 2015-10-11 | Discharge: 2015-10-11 | Disposition: A | Discharge status: Home / Self Care | Payer: Medicaid Other | Attending: Internal Medicine | Admitting: Internal Medicine

## 2015-10-11 DIAGNOSIS — I340 Nonrheumatic mitral (valve) insufficiency: Secondary | ICD-10-CM

## 2015-10-11 LAB — GLUCOSE, CAPILLARY
GLUCOSE-CAPILLARY: 144 mg/dL — AB (ref 65–99)
Glucose-Capillary: 145 mg/dL — ABNORMAL HIGH (ref 65–99)
Glucose-Capillary: 125 mg/dL — ABNORMAL HIGH (ref 65–99)

## 2015-10-11 LAB — PROTEIN / CREATININE RATIO, URINE
Creatinine, Urine: 84 mg/dL
Protein Creatinine Ratio: 4.96 mg/mg{Cre} — ABNORMAL HIGH (ref 0.00–0.15)
Total Protein, Urine: 417 mg/dL

## 2015-10-11 LAB — HEMOGLOBIN A1C: Hgb A1c MFr Bld: 6.6 % — ABNORMAL HIGH (ref 4.0–6.0)

## 2015-10-11 LAB — C DIFFICILE QUICK SCREEN W PCR REFLEX
C DIFFICILE (CDIFF) INTERP: NEGATIVE
C DIFFICILE (CDIFF) TOXIN: NEGATIVE
C DIFFICLE (CDIFF) ANTIGEN: NEGATIVE

## 2015-10-11 LAB — T4, FREE: FREE T4: 1.03 ng/dL (ref 0.61–1.12)

## 2015-10-11 LAB — TROPONIN I: Troponin I: <0.03 ng/mL (ref <0.031)

## 2015-10-11 MED ORDER — FUROSEMIDE 10 MG/ML IJ SOLN
10.0000 mg/h | INTRAVENOUS | Status: DC
  Administered 2015-10-11 – 2015-10-14 (×3): 10 mg/h via INTRAVENOUS
  Filled 2015-10-11 (×4): qty 25

## 2015-10-11 MED ORDER — INSULIN ASPART 100 UNIT/ML ~~LOC~~ SOLN
0.0000 [IU] | Freq: Every day | SUBCUTANEOUS | Status: DC
  Administered 2015-10-15: 2 [IU] via SUBCUTANEOUS
  Filled 2015-10-11 (×2): qty 1

## 2015-10-11 MED ORDER — INSULIN ASPART 100 UNIT/ML ~~LOC~~ SOLN: 0.0000 [IU] | Freq: Three times a day (TID) | SUBCUTANEOUS | Status: DC

## 2015-10-11 MED ORDER — COLLAGENASE 250 UNIT/GM EX OINT
TOPICAL_OINTMENT | Freq: Every day | CUTANEOUS | Status: DC
Start: 2015-10-11 — End: 2015-10-31
  Administered 2015-10-11 – 2015-10-31 (×18): via TOPICAL
  Filled 2015-10-11 (×3): qty 30

## 2015-10-11 MED ORDER — ENOXAPARIN SODIUM 30 MG/0.3ML ~~LOC~~ SOLN
30.0000 mg | SUBCUTANEOUS | Status: DC
  Administered 2015-10-11 – 2015-10-15 (×5): 30 mg via SUBCUTANEOUS
  Filled 2015-10-11 (×5): qty 0.3

## 2015-10-11 NOTE — Progress Notes (Signed)
Pt is alert and oriented x 4, pt denies pain, discomfort experienced with movement due to anasarca, continues on 2 L oxygen, vital signs stable, incontinent of urine at times, loose throughout shift, denies chest pain, echo performed throughout shift, IV lasix infusing, wound care consulted with patient, wound care provided, creatinine level remains elevated, [facility] by assist, uneventful shift.

## 2015-10-11 NOTE — Progress Notes (Signed)
*  PRELIMINARY RESULTS* Echocardiogram 2D Echocardiogram has been performed.  Karen Rich 10/11/2015, 4:39 PM

## 2015-10-11 NOTE — Progress Notes (Signed)
[facility] [place] [facility]   PATIENT NAME: Karen SailsCamille Rich    MR#:  [id number]  DATE OF BIRTH:  November 19, 1976  SUBJECTIVE:  Patient reports over 30 pound weight gain  REVIEW OF SYSTEMS:    Review of Systems  Constitutional: Negative for fever, chills and malaise/fatigue.  HENT: Negative for sore throat.   Eyes: Negative for blurred vision.  Respiratory: Positive for shortness of breath. Negative for cough, hemoptysis and wheezing.   Cardiovascular: Positive for leg swelling and PND. Negative for chest pain and palpitations.  Gastrointestinal: Negative for nausea, vomiting, abdominal pain, diarrhea and blood in stool.  Genitourinary: Negative for dysuria.  Musculoskeletal: Negative for back pain.  Neurological: Negative for dizziness, tremors and headaches.  Endo/Heme/Allergies: Does not bruise/bleed easily.    Tolerating Diet:yes      DRUG ALLERGIES:  No Known Allergies  VITALS:  Blood pressure 143/87, pulse 95, temperature 98.5 F (36.9 C), temperature source Oral, resp. rate 22, height 5' 3.5" (1.613 m), weight 112.492 kg (248 lb), last menstrual period 09/08/2015, SpO2 100 %.  PHYSICAL EXAMINATION:   Physical Exam    LABORATORY PANEL:   CBC  Recent Labs Lab 10/10/15 2016  WBC 6.5  HGB 9.8*  HCT 31.1*  PLT 425   ------------------------------------------------------------------------------------------------------------------  Chemistries   Recent Labs Lab 10/10/15 1446 10/10/15 2016  NA 138  --   K 4.7  --   CL 111  --   CO2 19*  --   GLUCOSE 194*  --   BUN 36*  --   CREATININE 3.16* 3.11*  CALCIUM 8.6* 8.7*  MG  --  2.1  AST  --  30  ALT  --  37  ALKPHOS  --  117  BILITOT  --  0.5   ------------------------------------------------------------------------------------------------------------------  Cardiac Enzymes  Recent Labs Lab 10/10/15 2016 10/11/15 0150 10/11/15 0725  TROPONINI 0.03 <0.03 <0.03    ------------------------------------------------------------------------------------------------------------------  RADIOLOGY:  Dg Chest 2 View  10/10/2015  CLINICAL DATA:  Short of breath and edema EXAM: CHEST  2 VIEW COMPARISON:  07/10/2013 FINDINGS: Lungs are severely under aerated. Small pleural effusions are noted on the lateral view. There is increased opacity towards the lung bases favored represent volume loss. Developing airspace disease is not excluded. Pulmonary vasculature is within normal limits. IMPRESSION: Low lung volumes. Small bilateral pleural effusions Bibasilar volume loss. Electronically Signed   By: Jolaine ClickArthur  Hoss M.D.   On: 10/10/2015 15:46   [place] Renal  10/10/2015  CLINICAL DATA:  Acute renal failure. EXAM: RENAL / URINARY TRACT ULTRASOUND COMPLETE COMPARISON:  07/10/2013 chest CT. FINDINGS: Right Kidney: Length: 9.3 cm. Echogenic right kidney with no right hydronephrosis and no right renal mass. No right renal cortical atrophy. Left Kidney: Length: 10.7 cm. Echogenic left kidney with no left hydronephrosis and no left renal mass. No left renal cortical atrophy. Incidentally noted is a small left pleural effusion. Bladder: Appears normal for degree of bladder distention. IMPRESSION: 1. No hydronephrosis. 2. Echogenic normal size kidneys, indicating nonspecific renal parenchymal disease of uncertain chronicity. 3. Incidental small left pleural effusion. 4. Normal bladder. Electronically Signed   By: Delbert PhenixJason A Poff M.D.   On: 10/10/2015 18:59     ASSESSMENT AND PLAN:   39 year old female with a history of type 2 diabetes, hyperlipidemia and chronic kidney disease stage III presents to the ER with ascites.   1. Generalized anasarca: This is likely secondary to renal disease. Patient will be started on Lasix drip and input  and output will be monitored. Appreciate nephrology consultation. Echocardiogram is pending to evaluate for possible congestive heart failure. Patient did have  an echocardiogram not too long ago and apparently this was normal.  2. Type 2 diabetes: Continue sliding scale insulin and check hemoglobin A1c. Continue Lantus 12 units at bedtime.  3. Abnormal TSH: Check T3 and T4.  4. Essential hypertension Blood pressure still elevated on Coreg. I will try Lasix gtt and see if this helps to decrease her blood pressure. If not I will  Need to add hydralazine.   Management plans discussed with the patient and she is in agreement.  CODE STATUS: FULL  TOTAL TIME TAKING CARE OF THIS PATIENT: 30 minutes.     POSSIBLE D/C 304 days, DEPENDING ON CLINICAL CONDITION.   Cyler Kappes M.D on 10/11/2015 at 12:45 PM  Between 7am to 6pm - Pager - [phone] After 6pm go to [email or web address] - password [email or web address]  [facility]  Office  [phone]  CC: Primary care physician; Sherrie Mustache, MD  Note: This dictation was prepared with Dragon dictation along with smaller phrase technology. Any transcriptional errors that result from this process are unintentional.

## 2015-10-11 NOTE — Consult Note (Signed)
Date: 10/11/2015                  Patient Name:  Karen Rich  MRN: [id number]  DOB: 04-Oct-1976  Age / Sex: 39 y.o., female         PCP: Sherrie Mustache, MD                 Service Requesting Consult: Internal medicine                 Reason for Consult: Anasarca, acute renal failure            History of Present Illness: Patient is a 39 y.o. female with medical problems of Diabetes type II (since  Age 35, poorly controlled) with severe complications including retinopathy and kidney disease, hypertension, obesity who was admitted to [facility] on 10/10/2015 for evaluation of acute renal failure and anasarca. Patient states that she started to have generalized swelling earlier this year.  She was prescribed diuretics but she decided to discontinue them on her own.  She has known chronic kidney disease which is likely diabetes related.  Patient admits that her diabetes has been poorly controlled over the years. Her baseline creatinine appears to be 1.2, GFR of >60 from September 2015.  No records are available in between. Presenting creatinine is 3.16, today's creatinine is 3.11 corresponding to GFR of 20 Her albumin is 2.2 She has gained almost close to 30 pounds over the year   Medications: Outpatient medications: No prescriptions prior to admission    Current medications: Current Facility-Administered Medications  Medication Dose Route Frequency Provider Last Rate Last Dose  . 0.9 %  sodium chloride infusion  250 mL Intravenous PRN Auburn Bilberry, MD      . acetaminophen (TYLENOL) tablet 650 mg  650 mg Oral Q6H PRN Auburn Bilberry, MD       Or  . acetaminophen (TYLENOL) suppository 650 mg  650 mg Rectal Q6H PRN Auburn Bilberry, MD      . carvedilol (COREG) tablet 12.5 mg  12.5 mg Oral BID WC Auburn Bilberry, MD   12.5 mg at 10/11/15 0908  . collagenase (SANTYL) ointment   Topical Daily Sital Mody, MD      . enoxaparin (LOVENOX) injection 30 mg  30 mg Subcutaneous Q24H Sital Mody, MD       . furosemide (LASIX) 250 mg in dextrose 5 % 250 mL (1 mg/mL) infusion  10 mg/hr Intravenous Continuous Adrian Saran, MD 10 mL/hr at 10/11/15 1333 10 mg/hr at 10/11/15 1333  . hydrALAZINE (APRESOLINE) injection 10 mg  10 mg Intravenous Q6H PRN Auburn Bilberry, MD      . insulin aspart (novoLOG) injection 0-20 Units  0-20 Units Subcutaneous TID WC Sital Mody, MD      . insulin aspart (novoLOG) injection 0-5 Units  0-5 Units Subcutaneous QHS Sital Mody, MD      . insulin aspart (novoLOG) injection 0-9 Units  0-9 Units Subcutaneous TID WC Auburn Bilberry, MD   1 Units at 10/11/15 1152  . insulin glargine (LANTUS) injection 12 Units  12 Units Subcutaneous Daily Auburn Bilberry, MD   12 Units at 10/11/15 0929  . nitroGLYCERIN (NITROGLYN) 2 % ointment 1 inch  1 inch Topical 4 times per day Governor Rooks, MD   1 inch at 10/11/15 1152  . ondansetron (ZOFRAN) tablet 4 mg  4 mg Oral Q6H PRN Auburn Bilberry, MD       Or  . ondansetron [facility]) injection 4  mg  4 mg Intravenous Q6H PRN Auburn BilberryShreyang Patel, MD      . sodium chloride 0.9 % injection 3 mL  3 mL Intravenous Q12H Auburn BilberryShreyang Patel, MD   3 mL at 10/11/15 0011  . sodium chloride 0.9 % injection 3 mL  3 mL Intravenous Q12H Auburn BilberryShreyang Patel, MD   3 mL at 10/11/15 0909  . sodium chloride 0.9 % injection 3 mL  3 mL Intravenous PRN Auburn BilberryShreyang Patel, MD   3 mL at 10/11/15 [id number]      Allergies: No Known Allergies    Past Medical History: Past Medical History  Diagnosis Date  . Edema   . Hyperlipidemia   . Hyperglycemia   . Fatigue   . Irregular heart beat   . Hypertension   . Diabetes mellitus without complication (HCC)   . Renal disorder   . Renal failure     stage 3     Past Surgical History: Past Surgical History  Procedure Laterality Date  . None       Family History: Family History  Problem Relation Age of Onset  . Hypertension Mother   . Diabetes Mother   . Hypertension Father   . CVA Father      Social History: Social History    Social History  . Marital Status: Divorced    Spouse Name: N/A  . Number of Children: N/A  . Years of Education: N/A   Occupational History  . Not on file.   Social History Main Topics  . Smoking status: Never Smoker   . Smokeless tobacco: Not on file  . Alcohol Use: No  . Drug Use: No  . Sexual Activity: Not on file   Other Topics Concern  . Not on file   Social History Narrative     Review of Systems: Gen: no fevers, chills, significant weight gain HEENT: diabetic retinopathy CV: no chest pain has significant, lower extremity edema Resp: reports dyspnea on exertion, no cough or sputum XB:[id number]:reports loose stools, no blood in the urine GU : no problems with voiding MS: no recent reports of joint pains or swelling Derm:  Patient reports multiple excoriations over arms and legs Psych: no complaints Heme: no complaints Neuro: generalized weakness Endocrine.  Diabetes poorly controlled  Vital Signs: Blood pressure 143/86, pulse 89, temperature 98.5 F (36.9 C), temperature source Oral, resp. rate 22, height 5' 3.5" (1.613 m), weight 112.492 kg (248 lb), last menstrual period 09/08/2015, SpO2 98 %.   Intake/Output Summary (Last 24 hours) at 10/11/15 1536 Last data filed at 10/11/15 0830  Gross per 24 hour  Intake      0 ml  Output      0 ml  Net      0 ml    Weight trends: [employer]ights   10/10/15 1939  Weight: 112.492 kg (248 lb)    Physical Exam: General:  alert, oriented, obese  HEENT Moist oral mucous membranes, anicteric sclera  Neck:  supple, no masses  Lungs: Mild rhonchi otherwise clear to auscultation  Heart::  Animas gallops, soft systolic murmur, no rub  Abdomen: Soft, distended, nontender, ascites  Extremities:  3+ generalized edema arms and legs  Neurologic: Alert, oriented  Skin: Excoriations over her arms and legs, previous weeping ulcers healed  Access:   Foley:        Lab results: Basic Metabolic Panel:  Recent Labs Lab  10/10/15 1446 10/10/15 2016  NA 138  --   K 4.7  --  CL 111  --   CO2 19*  --   GLUCOSE 194*  --   BUN 36*  --   CREATININE 3.16* 3.11*  CALCIUM 8.6* 8.7*  MG  --  2.1    Liver Function Tests:  Recent Labs Lab 10/10/15 2016  AST 30  ALT 37  ALKPHOS 117  BILITOT 0.5  PROT 6.2*  ALBUMIN 2.2*   No results for input(s): LIPASE, AMYLASE in the last 168 hours. No results for input(s): AMMONIA in the last 168 hours.  CBC:  Recent Labs Lab 10/10/15 1446 10/10/15 2016  WBC 6.4 6.5  NEUTROABS 4.9  --   HGB 9.8* 9.8*  HCT 31.0* 31.1*  MCV 88.1 88.1  PLT 410 425    Cardiac Enzymes:  Recent Labs Lab 10/11/15 0725  TROPONINI <0.03    BNP: Invalid input(s): POCBNP  CBG:  Recent Labs Lab 10/11/15 1122  GLUCAP 145*    Microbiology: Recent Results (from the past 720 hour(s))  C difficile quick scan w PCR reflex     Status: None   Collection Time: 10/10/15 10:21 PM  Result Value Ref Range Status   C Diff antigen NEGATIVE NEGATIVE Final   C Diff toxin NEGATIVE NEGATIVE Final   C Diff interpretation Negative for C. difficile  Final     Coagulation Studies: No results for input(s): LABPROT, INR in the last 72 hours.  Urinalysis: No results for input(s): COLORURINE, LABSPEC, PHURINE, GLUCOSEU, HGBUR, BILIRUBINUR, KETONESUR, PROTEINUR, UROBILINOGEN, NITRITE, LEUKOCYTESUR in the last 72 hours.  Invalid input(s): APPERANCEUR    Imaging: Dg Chest 2 View  10/10/2015  CLINICAL DATA:  Short of breath and edema EXAM: CHEST  2 VIEW COMPARISON:  07/10/2013 FINDINGS: Lungs are severely under aerated. Small pleural effusions are noted on the lateral view. There is increased opacity towards the lung bases favored represent volume loss. Developing airspace disease is not excluded. Pulmonary vasculature is within normal limits. IMPRESSION: Low lung volumes. Small bilateral pleural effusions Bibasilar volume loss. Electronically Signed   By: Jolaine Click M.D.   On:  10/10/2015 15:46   US Renal  10/10/2015  CLINICAL DATA:  Acute renal failure. EXAM: RENAL / URINARY TRACT ULTRASOUND COMPLETE COMPARISON:  07/10/2013 chest CT. FINDINGS: Right Kidney: Length: 9.3 cm. Echogenic right kidney with no right hydronephrosis and no right renal mass. No right renal cortical atrophy. Left Kidney: Length: 10.7 cm. Echogenic left kidney with no left hydronephrosis and no left renal mass. No left renal cortical atrophy. Incidentally noted is a small left pleural effusion. Bladder: Appears normal for degree of bladder distention. IMPRESSION: 1. No hydronephrosis. 2. Echogenic normal size kidneys, indicating nonspecific renal parenchymal disease of uncertain chronicity. 3. Incidental small left pleural effusion. 4. Normal bladder. Electronically Signed   By: Delbert Phenix M.D.   On: 10/10/2015 18:59      Assessment & Plan: Pt is a 39 y.o. yo female with medical problems of Diabetes type II (since  Age 36, poorly controlled) with severe complications including retinopathy and kidney disease, hypertension, obesity who was admitted to [facility] on 10/10/2015 for evaluation of acute renal failure and anasarca.  1.  Acute renal failure.  Unclear cause. 2.  Chronic kidney disease stage II/III.  Baseline creatinine 1.2/GFR greater than 60 September 2015 3.  Generalized edema and anasarca 4.  Diabetes type 2 with chronic kidney disease  Plan: IV Lasix infusion with albumin support for diuresis Continue to monitor BMP for electrolytes and renal function Hemoglobin A1c pending Avoid  ACE inhibitor at this time

## 2015-10-11 NOTE — Progress Notes (Addendum)
Patient admitted to unit with generalized edema and shortness of breath, denies any pain or discomfort, VSS, [facility], pt on IV lasix, high fall risk. Skin assessment completed with Helmut MusterAlicia, RN.

## 2015-10-11 NOTE — Progress Notes (Signed)
fsbs 138 per NA Boneta LucksJenny

## 2015-10-11 NOTE — Consult Note (Signed)
WOC wound consult note Reason for Consult: Multiple areas of nonintact skin.  Moisture Associated Skin Damage (MASD) to buttocks and perineal area.  Full thickness skin loss to right lateral calf and right lateral malleolus.  Generalized edema. CHF exacerbation, acute.  Wound type:MASD, venous insufficiency to lower extremities. Trauma wound to right malleolus.  Patient states this area opened after walking in a shoe.  Pressure Ulcer POA: Yes right malleolus, unstageable pressure injury Measurement:Right lateral malleolus 1.2 cm x 1 cm unable to visualize wound bed due to slough Right lateral calf 4 cm x 3.2 cm x 0.1 cm wound bed 25% slough MASD to right buttocks 1 cm x 1 cm x 0.1 cm scattered nonintact lesions to perineal area.  Patient reports she has been having loose stools and incontinence.  Hard, nonpitting edema to upper thighs and abdomen present. No skin breakdown under abdominal pannus or under breasts.    Drainage (amount, consistency, odor) Minimal serosanguinous. No odor from wounds.  Periwound:Edema, generalized Dressing procedure/placement/frequency:For now, would like to begin debridement with Santyl ointment.  Once diuretics have removed some excess fluid, will consider compression to lower extremities.  Suggested wound care center consult to patient and she is considering this. Financial and transportation are concerns for her.  Cleanse wounds to right calf and right ankle with NS and pat gently dry.  Apply Santyl to wound bed, 1/8 inch thickness (opaque).  COver with NS moist gauze.  COver with 4x4 gauze, kerlix and tape.  Change daily. Once edema to abdomen has improved, would like to consider Unnas boots.  WOC team will follow Maple HudsonKaren Shmuel Girgis RN BSN [facility] Pager [phone]

## 2015-10-11 NOTE — Care Management (Signed)
Patient presents to ED from home with complaint of shortness of breath.  She has history of diabetes, hypertension, and chronic kidney issues.  She admits to "just stopping all of her medication."  Reason she gives is- she was being referred out to 'so many" different doctors and everyone was changing her medicines that had just been ordered by another doctor.  Says it became too much to deal with and she stopped them all.  Verbalizes understanding that this was not a good decision.  She is currently on 02 and this is acute.  Will need to assess for need of home 02 but hopefully she can be weaned.  She is very agreeable to having a home health nurse come and visit to help her with med administration and deal with MD visit schedule.  [place] and confirms her address and contact information.  Is current with her PCP- Dr Dario GuardianJadali.  She denies having problems accessing medical care or obtaining her medications

## 2015-10-12 LAB — BASIC METABOLIC PANEL
ANION GAP: 5 (ref 5–15)
BUN: 38 mg/dL — ABNORMAL HIGH (ref 6–20)
CHLORIDE: 113 mmol/L — AB (ref 101–111)
CO2: 20 mmol/L — AB (ref 22–32)
CREATININE: 3.44 mg/dL — AB (ref 0.44–1.00)
Calcium: 8.4 mg/dL — ABNORMAL LOW (ref 8.9–10.3)
GFR calc non Af Amer: 15 mL/min — ABNORMAL LOW (ref >60)
GFR, EST AFRICAN AMERICAN: 18 mL/min — AB (ref >60)
GLUCOSE: 107 mg/dL — AB (ref 65–99)
Potassium: 4.4 mmol/L (ref 3.5–5.1)
Sodium: 138 mmol/L (ref 135–145)

## 2015-10-12 LAB — GLUCOSE, CAPILLARY
GLUCOSE-CAPILLARY: 89 mg/dL (ref 65–99)
Glucose-Capillary: 126 mg/dL — ABNORMAL HIGH (ref 65–99)
Glucose-Capillary: 186 mg/dL — ABNORMAL HIGH (ref 65–99)
Glucose-Capillary: 193 mg/dL — ABNORMAL HIGH (ref 65–99)

## 2015-10-12 LAB — C DIFFICILE QUICK SCREEN W PCR REFLEX
C DIFFICILE (CDIFF) INTERP: NEGATIVE
C DIFFICILE (CDIFF) TOXIN: NEGATIVE
C DIFFICLE (CDIFF) ANTIGEN: NEGATIVE

## 2015-10-12 LAB — T3: T3, Total: 88 ng/dL (ref 71–180)

## 2015-10-12 NOTE — Progress Notes (Signed)
Initial Nutrition Assessment    INTERVENTION:   Meals and Snacks: Cater to patient preferences Education: provided brief verbal education on diet, specifically low sodium portion of diet. Questions answered, receptive to education. Declined written material at this time. Will provide further education as needed.   NUTRITION DIAGNOSIS:   Food and nutrition related knowledge deficit related to chronic illness as evidenced by  (pt with questions regarding diet).  GOAL:   Patient will meet greater than or equal to 90% of their needs  MONITOR:    (Energy Intake, Anthropometrics, Electrolyte/Renal Profile)  REASON FOR ASSESSMENT:   Diagnosis    ASSESSMENT:    Pt admitted with generalized anasarca with acute CHF, acute on CKD; ECHO with EF 40-45%; on IV lasix drip. Pt with multiple wounds/ulcerations  Past Medical History  Diagnosis Date  . Edema   . Hyperlipidemia   . Hyperglycemia   . Fatigue   . Irregular heart beat   . Hypertension   . Diabetes mellitus without complication (HCC)   . Renal disorder   . Renal failure     stage 3    Diet Order:  Diet heart healthy/carb modified Room service appropriate?: Yes; Fluid consistency:: Thin   Energy Intake: recorded po intake 100% of meals  Food and Nutrition Related History: pt reports good appetite prior to admission  Electrolyte and Renal Profile:  Recent Labs Lab 10/10/15 1446 10/10/15 2016 10/12/15 0602  BUN 36*  --  38*  CREATININE 3.16* 3.11* 3.44*  NA 138  --  138  K 4.7  --  4.4  MG  --  2.1  --    Glucose Profile:   Recent Labs  10/11/15 2109 10/12/15 0729 10/12/15 1103  GLUCAP 144* 89 126*   Meds: lasix drip, ss novolog, lantus  Height:   Ht Readings from Last 1 Encounters:  10/10/15 5' 3.5" (1.613 m)    Weight: noted wt gain  Wt Readings from Last 1 Encounters:  10/10/15 248 lb (112.492 kg)    Wt Readings from Last 10 Encounters:  10/10/15 248 lb (112.492 kg)  03/08/15 180 lb 4 oz  (81.761 kg)    BMI:  Body mass index is 43.24 kg/(m^2).  EDUCATION NEEDS:   Education needs addressed  LOW Care Level  Romelle StarcherCate Fountain Derusha MS, [place]RD, LDN [phone] Pager

## 2015-10-12 NOTE — Care Management (Addendum)
Patient now on lasix drip.  Discussed need to assess for need of home 02 during progression.  Patient continues to be in agreement with home health nursing.  No agency preference.  Patient will need to be seen within 24 hours of discharge.  She is at [facility]

## 2015-10-12 NOTE — Plan of Care (Signed)
Problem: Safety: Goal: Ability to remain free from injury will improve Outcome: Completed/Met Date Met:  10/12/15 Fall risk level assessed. Pt determined to be high fall risk. Educated about the importance of calling instead of falling. Pt signed fall contract. Bed alarm on. Room free of clutter. Walkways are clear.

## 2015-10-12 NOTE — Progress Notes (Signed)
Subjective:  Patient was started on Lasix drip yesterday  She appears to have had good response She feels like her abdomen is less swollen Appetite is good Serum creatinine slightly worseat 3.44  Objective:  Vital signs in last 24 hours:  Temp:  [97.4 F (36.3 C)-98 F (36.7 C)] 98 F (36.7 C) (11/04 1224) Pulse Rate:  [76-89] 76 (11/04 1224) Resp:  [21-23] 21 (11/04 1224) BP: (139-151)/(76-91) 148/91 mmHg (11/04 1224) SpO2:  [98 %-100 %] 98 % (11/04 1224)  Weight change:  Filed Weights   10/10/15 1939  Weight: 112.492 kg (248 lb)    Intake/Output:    Intake/Output Summary (Last 24 hours) at 10/12/15 1235 Last data filed at 10/12/15 1215  Gross per 24 hour  Intake  893.5 ml  Output   2175 ml  Net -1281.5 ml     Physical Exam: General: Sitting up, no acute distress, anasarca  HEENT Moist mucous membranes, anicteric  Neck supple  Pulm/lungs Bilateral crackles  CVS/Heart Regular rhythm, no rub or gallop  Abdomen:  Soft, nontender, distended  Extremities: 2-3+ pitting edema  Neurologic: Alert, oriented, ambulatory  Skin: excoriations  Access:        Basic Metabolic Panel:   Recent Labs Lab 10/10/15 1446 10/10/15 2016 10/12/15 0602  NA 138  --  138  K 4.7  --  4.4  CL 111  --  113*  CO2 19*  --  20*  GLUCOSE 194*  --  107*  BUN 36*  --  38*  CREATININE 3.16* 3.11* 3.44*  CALCIUM 8.6* 8.7* 8.4*  MG  --  2.1  --      CBC:  Recent Labs Lab 10/10/15 1446 10/10/15 2016  WBC 6.4 6.5  NEUTROABS 4.9  --   HGB 9.8* 9.8*  HCT 31.0* 31.1*  MCV 88.1 88.1  PLT 410 425      Microbiology:  Recent Results (from the past 720 hour(s))  C difficile quick scan w PCR reflex     Status: None   Collection Time: 10/10/15 10:21 PM  Result Value Ref Range Status   C Diff antigen NEGATIVE NEGATIVE Final   C Diff toxin NEGATIVE NEGATIVE Final   C Diff interpretation Negative for C. difficile  Final  C difficile quick scan w PCR reflex     Status: None    Collection Time: 10/12/15 10:35 AM  Result Value Ref Range Status   C Diff antigen NEGATIVE NEGATIVE Final   C Diff toxin NEGATIVE NEGATIVE Final   C Diff interpretation Negative for C. difficile  Final    Coagulation Studies: No results for input(s): LABPROT, INR in the last 72 hours.  Urinalysis: No results for input(s): COLORURINE, LABSPEC, PHURINE, GLUCOSEU, HGBUR, BILIRUBINUR, KETONESUR, PROTEINUR, UROBILINOGEN, NITRITE, LEUKOCYTESUR in the last 72 hours.  Invalid input(s): APPERANCEUR    Imaging: Dg Chest 2 View  10/10/2015  CLINICAL DATA:  Short of breath and edema EXAM: CHEST  2 VIEW COMPARISON:  07/10/2013 FINDINGS: Lungs are severely under aerated. Small pleural effusions are noted on the lateral view. There is increased opacity towards the lung bases favored represent volume loss. Developing airspace disease is not excluded. Pulmonary vasculature is within normal limits. IMPRESSION: Low lung volumes. Small bilateral pleural effusions Bibasilar volume loss. Electronically Signed   By: Jolaine ClickArthur  Hoss M.D.   On: 10/10/2015 15:46   [place] Renal  10/10/2015  CLINICAL DATA:  Acute renal failure. EXAM: RENAL / URINARY TRACT ULTRASOUND COMPLETE COMPARISON:  07/10/2013 chest CT.  FINDINGS: Right Kidney: Length: 9.3 cm. Echogenic right kidney with no right hydronephrosis and no right renal mass. No right renal cortical atrophy. Left Kidney: Length: 10.7 cm. Echogenic left kidney with no left hydronephrosis and no left renal mass. No left renal cortical atrophy. Incidentally noted is a small left pleural effusion. Bladder: Appears normal for degree of bladder distention. IMPRESSION: 1. No hydronephrosis. 2. Echogenic normal size kidneys, indicating nonspecific renal parenchymal disease of uncertain chronicity. 3. Incidental small left pleural effusion. 4. Normal bladder. Electronically Signed   By: Delbert Phenix M.D.   On: 10/10/2015 18:59     Medications:   . furosemide (LASIX) infusion 10  mg/hr (10/11/15 1333)   . carvedilol  12.5 mg Oral BID WC  . collagenase   Topical Daily  . enoxaparin (LOVENOX) injection  30 mg Subcutaneous Q24H  . insulin aspart  0-5 Units Subcutaneous QHS  . insulin aspart  0-9 Units Subcutaneous TID WC  . insulin glargine  12 Units Subcutaneous Daily  . sodium chloride  3 mL Intravenous Q12H  . sodium chloride  3 mL Intravenous Q12H   sodium chloride, acetaminophen **OR** acetaminophen, hydrALAZINE, ondansetron **OR** ondansetron (ZOFRAN) IV, sodium chloride  Assessment/ Plan:  39 y.o. female with medical problems of Diabetes type II (since Age 62, poorly controlled) with severe complications including retinopathy and kidney disease, hypertension, obesity who was admitted to [facility] on 10/10/2015 for evaluation of acute renal failure and anasarca.  1. Acute renal failure. Unclear cause. Differential includes diabetic nephropathy, hypertensive nephrosclerosis, FSGS 2. Chronic kidney disease stage II/III. Baseline creatinine 1.2/GFR greater than 60 September 2015 3. Generalized edema and anasarca 4. Diabetes type 2 with chronic kidney disease 5. Proteinuria. Urine protein/creatinine ratio 5 g  Plan: IV Lasix infusion with albumin support for diuresis Continue to monitor BMP for electrolytes and renal function Hemoglobin A1c 6.6% Avoid ACE inhibitor at this time Will order screening ANA   LOS: 2 Nelson Noone 11/4/201612:35 PM

## 2015-10-12 NOTE — Progress Notes (Signed)
[facility] [place] at [facility]   PATIENT NAME: Karen Rich    MR#:  [id number]  DATE OF BIRTH:  December 13, 1975  SUBJECTIVE:  Patient reports that IV Lasix is helping with diuresis.  REVIEW OF SYSTEMS:    Review of Systems  Constitutional: Negative for fever, chills and malaise/fatigue.  HENT: Negative for sore throat.   Eyes: Negative for blurred vision.  Respiratory: Positive for shortness of breath. Negative for cough, hemoptysis and wheezing.   Cardiovascular: Positive for leg swelling. Negative for chest pain, palpitations and PND.  Gastrointestinal: Negative for nausea, vomiting, abdominal pain, diarrhea and blood in stool.  Genitourinary: Negative for dysuria.  Musculoskeletal: Negative for back pain.  Neurological: Negative for dizziness, tremors and headaches.  Endo/Heme/Allergies: Does not bruise/bleed easily.    Tolerating Diet:yes      DRUG ALLERGIES:  No Known Allergies  VITALS:  Blood pressure 148/91, pulse 76, temperature 98 F (36.7 C), temperature source Oral, resp. rate 21, height 5' 3.5" (1.613 m), weight 112.492 kg (248 lb), last menstrual period 09/08/2015, SpO2 98 %.  PHYSICAL EXAMINATION:   Physical Exam  Constitutional: She is oriented to person, place, and time and well-developed, well-nourished, and in no distress. No distress.  HENT:  Head: Normocephalic.  Eyes: No scleral icterus.  Neck: Normal range of motion. Neck supple. No JVD present. No tracheal deviation present.  Cardiovascular: Normal rate, regular rhythm and normal heart sounds.  Exam reveals no gallop and no friction rub.   No murmur heard. Pulmonary/Chest: Effort normal and breath sounds normal. No respiratory distress. She has no wheezes. She has no rales. She exhibits no tenderness.  Abdominal: Soft. Bowel sounds are normal. She exhibits no distension and no mass. There is no tenderness. There is no rebound and no guarding.  Musculoskeletal: Normal  range of motion. She exhibits edema.  Neurological: She is alert and oriented to person, place, and time.  Skin: Skin is warm. No rash noted. No erythema.  Psychiatric: Affect and judgment normal.      LABORATORY PANEL:   CBC  Recent Labs Lab 10/10/15 2016  WBC 6.5  HGB 9.8*  HCT 31.1*  PLT 425   ------------------------------------------------------------------------------------------------------------------  Chemistries   Recent Labs Lab 10/10/15 2016 10/12/15 0602  NA  --  138  K  --  4.4  CL  --  113*  CO2  --  20*  GLUCOSE  --  107*  BUN  --  38*  CREATININE 3.11* 3.44*  CALCIUM 8.7* 8.4*  MG 2.1  --   AST 30  --   ALT 37  --   ALKPHOS 117  --   BILITOT 0.5  --    ------------------------------------------------------------------------------------------------------------------  Cardiac Enzymes  Recent Labs Lab 10/10/15 2016 10/11/15 0150 10/11/15 0725  TROPONINI 0.03 <0.03 <0.03   ------------------------------------------------------------------------------------------------------------------  RADIOLOGY:  Dg Chest 2 View  10/10/2015  CLINICAL DATA:  Short of breath and edema EXAM: CHEST  2 VIEW COMPARISON:  07/10/2013 FINDINGS: Lungs are severely under aerated. Small pleural effusions are noted on the lateral view. There is increased opacity towards the lung bases favored represent volume loss. Developing airspace disease is not excluded. Pulmonary vasculature is within normal limits. IMPRESSION: Low lung volumes. Small bilateral pleural effusions Bibasilar volume loss. Electronically Signed   By: Jolaine Click M.D.   On: 10/10/2015 15:46   US Renal  10/10/2015  CLINICAL DATA:  Acute renal failure. EXAM: RENAL / URINARY TRACT ULTRASOUND COMPLETE COMPARISON:  07/10/2013  chest CT. FINDINGS: Right Kidney: Length: 9.3 cm. Echogenic right kidney with no right hydronephrosis and no right renal mass. No right renal cortical atrophy. Left Kidney: Length: 10.7  cm. Echogenic left kidney with no left hydronephrosis and no left renal mass. No left renal cortical atrophy. Incidentally noted is a small left pleural effusion. Bladder: Appears normal for degree of bladder distention. IMPRESSION: 1. No hydronephrosis. 2. Echogenic normal size kidneys, indicating nonspecific renal parenchymal disease of uncertain chronicity. 3. Incidental small left pleural effusion. 4. Normal bladder. Electronically Signed   By: Delbert PhenixJason A Poff M.D.   On: 10/10/2015 18:59     ASSESSMENT AND PLAN:   39 year old female with a history of type 2 diabetes, hyperlipidemia and chronic kidney disease stage III presents to the ER with ascites.   1. Generalized anasarca: This is likely secondary to renal disease and acute systolic heart failure. Patient will continue on Lasix drip and albumin infusion. Input and output will be monitored. Appreciate nephrology consultation. Echocardiogram shows EF 40-45%.  2. Type 2 diabetes: Continue sliding scale insulin. Continue Lantus 12 units at bedtime. A1c is 6.6  3. Abnormal TSH:  T3 and T4 are within normal limits.  4. Essential hypertension Blood pressure improved with Lasix drip. Continue Coreg  5. Pressure ulcer present on admission right malleolus with skin breakdown to buttocks: Continue wound care as per consultation. Cleanse wounds to right calf and right ankle with NS and pat gently dry. Apply Santyl to wound bed, 1/8 inch thickness (opaque). COver with NS moist gauze. COver with 4x4 gauze, kerlix and tape. Change daily. Once edema to abdomen has improved, would like to consider Unnas boots.   6. Acute renal failure on chronic kidney disease stage II/3: Face and cranial 1.2. Unclear etiology at this time. Continue Lasix drip with albumin for diuresis as per pathology consultation.   Management plans discussed with the patient and she is in agreement.  CODE STATUS: FULL  TOTAL TIME TAKING CARE OF THIS PATIENT: 25 minutes.      POSSIBLE D/C 3-4 days, DEPENDING ON CLINICAL CONDITION.   Vera Wishart M.D on 10/12/2015 at 12:57 PM  Between 7am to 6pm - Pager - [phone] After 6pm go to [email or web address] - password [email or web address]  [facility]  Office  [phone]  CC: Primary care physician; Sherrie MustacheFayegh Jadali, MD  Note: This dictation was prepared with Dragon dictation along with smaller phrase technology. Any transcriptional errors that result from this process are unintentional.

## 2015-10-12 NOTE — Progress Notes (Signed)
Initial appointment made at the [facility] on November 06, 2015 at 10:00am. Thank you.

## 2015-10-13 LAB — BASIC METABOLIC PANEL WITH GFR
Anion gap: 5 (ref 5–15)
BUN: 42 mg/dL — ABNORMAL HIGH (ref 6–20)
CO2: 20 mmol/L — ABNORMAL LOW (ref 22–32)
Calcium: 8.3 mg/dL — ABNORMAL LOW (ref 8.9–10.3)
Chloride: 112 mmol/L — ABNORMAL HIGH (ref 101–111)
Creatinine, Ser: 3.58 mg/dL — ABNORMAL HIGH (ref 0.44–1.00)
GFR calc Af Amer: 17 mL/min — ABNORMAL LOW
GFR calc non Af Amer: 15 mL/min — ABNORMAL LOW
Glucose, Bld: 235 mg/dL — ABNORMAL HIGH (ref 65–99)
Potassium: 4.7 mmol/L (ref 3.5–5.1)
Sodium: 137 mmol/L (ref 135–145)

## 2015-10-13 LAB — GLUCOSE, CAPILLARY
GLUCOSE-CAPILLARY: 177 mg/dL — AB (ref 65–99)
Glucose-Capillary: 174 mg/dL — ABNORMAL HIGH (ref 65–99)
Glucose-Capillary: 106 mg/dL — ABNORMAL HIGH (ref 65–99)
Glucose-Capillary: 98 mg/dL (ref 65–99)

## 2015-10-13 MED ORDER — HYDRALAZINE HCL 25 MG PO TABS
37.5000 mg | ORAL_TABLET | Freq: Three times a day (TID) | ORAL | Status: DC
  Administered 2015-10-13 – 2015-10-22 (×20): 37.5 mg via ORAL
  Filled 2015-10-13 (×11): qty 2
  Filled 2015-10-13: qty 1
  Filled 2015-10-13: qty 2
  Filled 2015-10-13: qty 1
  Filled 2015-10-13 (×8): qty 2

## 2015-10-13 MED ORDER — HYDRALAZINE HCL 25 MG PO TABS
25.0000 mg | ORAL_TABLET | Freq: Three times a day (TID) | ORAL | Status: DC
  Administered 2015-10-13: 25 mg via ORAL
  Filled 2015-10-13: qty 1

## 2015-10-13 MED ORDER — ISOSORBIDE DINITRATE 10 MG PO TABS
10.0000 mg | ORAL_TABLET | Freq: Three times a day (TID) | ORAL | Status: DC
  Administered 2015-10-13 – 2015-10-21 (×22): 10 mg via ORAL
  Filled 2015-10-13 (×8): qty 1
  Filled 2015-10-13: qty 2
  Filled 2015-10-13 (×8): qty 1
  Filled 2015-10-13: qty 2
  Filled 2015-10-13 (×2): qty 1
  Filled 2015-10-13: qty 2

## 2015-10-13 NOTE — Progress Notes (Signed)
Subjective:  Patient was started on Lasix drip 11/3  She appears to have a fair response She feels like her abdomen is less swollen Appetite is good Serum creatinine slightly worseat 3.44->3.58  Objective:  Vital signs in last 24 hours:  Temp:  [97.8 F (36.6 C)-98 F (36.7 C)] 97.8 F (36.6 C) (11/05 16100632) Pulse Rate:  [76-81] 78 (11/05 0800) Resp:  [18-21] 20 (11/05 0800) BP: (148-170)/(87-96) 150/92 mmHg (11/05 0800) SpO2:  [98 %-100 %] 100 % (11/05 0800)  Weight change:  Filed Weights   10/10/15 1939  Weight: 112.492 kg (248 lb)    Intake/Output:    Intake/Output Summary (Last 24 hours) at 10/13/15 0909 Last data filed at 10/13/15 [id number]  Gross per 24 hour  Intake 1307.17 ml  Output   2050 ml  Net -742.83 ml     Physical Exam: General: Sitting up, no acute distress, anasarca  HEENT Moist mucous membranes, anicteric  Neck supple  Pulm/lungs Bilateral crackles  CVS/Heart Regular rhythm, no rub or gallop  Abdomen:  Soft, nontender, distended  Extremities: 2-3+ pitting edema  Neurologic: Alert, oriented, ambulatory  Skin: excoriations  Access:        Basic Metabolic Panel:   Recent Labs Lab 10/10/15 1446 10/10/15 2016 10/12/15 0602 10/13/15 0325  NA 138  --  138 137  K 4.7  --  4.4 4.7  CL 111  --  113* 112*  CO2 19*  --  20* 20*  GLUCOSE 194*  --  107* 235*  BUN 36*  --  38* 42*  CREATININE 3.16* 3.11* 3.44* 3.58*  CALCIUM 8.6* 8.7* 8.4* 8.3*  MG  --  2.1  --   --      CBC:  Recent Labs Lab 10/10/15 1446 10/10/15 2016  WBC 6.4 6.5  NEUTROABS 4.9  --   HGB 9.8* 9.8*  HCT 31.0* 31.1*  MCV 88.1 88.1  PLT 410 425      Microbiology:  Recent Results (from the past 720 hour(s))  C difficile quick scan w PCR reflex     Status: None   Collection Time: 10/10/15 10:21 PM  Result Value Ref Range Status   C Diff antigen NEGATIVE NEGATIVE Final   C Diff toxin NEGATIVE NEGATIVE Final   C Diff interpretation Negative for C. difficile   Final  C difficile quick scan w PCR reflex     Status: None   Collection Time: 10/12/15 10:35 AM  Result Value Ref Range Status   C Diff antigen NEGATIVE NEGATIVE Final   C Diff toxin NEGATIVE NEGATIVE Final   C Diff interpretation Negative for C. difficile  Final    Coagulation Studies: No results for input(s): LABPROT, INR in the last 72 hours.  Urinalysis: No results for input(s): COLORURINE, LABSPEC, PHURINE, GLUCOSEU, HGBUR, BILIRUBINUR, KETONESUR, PROTEINUR, UROBILINOGEN, NITRITE, LEUKOCYTESUR in the last 72 hours.  Invalid input(s): APPERANCEUR    Imaging: No results found.   Medications:   . furosemide (LASIX) infusion 10 mg/hr (10/12/15 1435)   . carvedilol  12.5 mg Oral BID WC  . collagenase   Topical Daily  . enoxaparin (LOVENOX) injection  30 mg Subcutaneous Q24H  . hydrALAZINE  25 mg Oral 3 times per day  . insulin aspart  0-5 Units Subcutaneous QHS  . insulin aspart  0-9 Units Subcutaneous TID WC  . insulin glargine  12 Units Subcutaneous Daily  . sodium chloride  3 mL Intravenous Q12H  . sodium chloride  3 mL Intravenous Q12H  sodium chloride, acetaminophen **OR** acetaminophen, hydrALAZINE, ondansetron **OR** ondansetron (ZOFRAN) IV, sodium chloride  Assessment/ Plan:  39 y.o. female with medical problems of Diabetes type II (since Age 64, poorly controlled) with severe complications including retinopathy and kidney disease, hypertension, obesity who was admitted to [facility] on 10/10/2015 for evaluation of acute renal failure and anasarca.  1. Acute renal failure. Unclear cause. Differential includes diabetic nephropathy, hypertensive nephrosclerosis, FSGS 2. Chronic kidney disease stage II/III. Baseline creatinine 1.2/GFR greater than 60 September 2015 3. Generalized edema and anasarca 4. Diabetes type 2 with chronic kidney disease 5. Proteinuria. Urine protein/creatinine ratio 5 g  Plan: IV Lasix infusion with albumin support for diuresis Continue  to monitor BMP for electrolytes and renal function Hemoglobin A1c 6.6% Avoid ACE inhibitor at this time  screening ANA pending   LOS: 3 Danna Sewell 11/5/20169:09 AM

## 2015-10-13 NOTE — Consult Note (Signed)
CARDIOLOGY CONSULT NOTE  Patient ID: Karen Rich, MRN: [id number], DOB/AGE: 1976-03-03 39 y.o. Admit date: 10/10/2015 Date of Consult: 10/13/2015  Primary Physician: Sherrie MustacheFayegh Jadali, MD Primary Cardiologist: Dr Kirke CorinArida Referring Physician: Dr Juliene PinaMody  Chief Complaint: Shortness of breath, swelling  Reason for Consultation: LV dysfunction/CHF  HPI: This is a 39 year old woman who presented with swelling and shortness of breath. The patient has long-standing diabetes since age 39. She became frustrated with her medical care earlier this year as she felt she was receiving mixed messages from multiple providers. She stopped taking all of her medications about 4 months ago. Since that time she has had progressive weakness, shortness of breath, and swelling. She hasn't been out of her house in the past month. She's had to sleep in a chair because of orthopnea and PND. She was hospitalized with acute renal failure. An echocardiogram has been performed and this demonstrated mild global LV systolic dysfunction, new from a previous echo done earlier this year. We are asked to see her to evaluate for heart failure and cardiomyopathy.  The patient has not had chest pain. She does describe some pressure in the abdomen and relates this to fluid retention. She's had a cough, shortness of breath, leg, abdominal, and arm swelling. She states that fluid has been seeping out of her skin.  The patient has been started on an IV furosemide drip at 10 mg per hour. She reports modest clinical improvement since hospital admission.  Medical History:  Past Medical History  Diagnosis Date  . Edema   . Hyperlipidemia   . Hyperglycemia   . Fatigue   . Irregular heart beat   . Hypertension   . Diabetes mellitus without complication (HCC)   . Renal disorder   . Renal failure     stage 3      Surgical History:  Past Surgical History  Procedure Laterality Date  . None       Home Meds: Prior to Admission  medications   Not on File    Inpatient Medications:  . carvedilol  12.5 mg Oral BID WC  . collagenase   Topical Daily  . enoxaparin (LOVENOX) injection  30 mg Subcutaneous Q24H  . hydrALAZINE  25 mg Oral 3 times per day  . insulin aspart  0-5 Units Subcutaneous QHS  . insulin aspart  0-9 Units Subcutaneous TID WC  . insulin glargine  12 Units Subcutaneous Daily  . sodium chloride  3 mL Intravenous Q12H  . sodium chloride  3 mL Intravenous Q12H   . furosemide (LASIX) infusion 10 mg/hr (10/12/15 1435)    Allergies: No Known Allergies  Social History   Social History  . Marital Status: Divorced    Spouse Name: N/A  . Number of Children: N/A  . Years of Education: N/A   Occupational History  . Not on file.   Social History Main Topics  . Smoking status: Never Smoker   . Smokeless tobacco: Not on file  . Alcohol Use: No  . Drug Use: No  . Sexual Activity: Not on file   Other Topics Concern  . Not on file   Social History Narrative     Family History  Problem Relation Age of Onset  . Hypertension Mother   . Diabetes Mother   . Hypertension Father   . CVA Father      Review of Systems: General: negative for chills, fever, night sweats. Positive weight gain. ENT: negative for rhinorrhea or epistaxis Cardiovascular:  see HPI. No palpitations or syncope.  Dermatological: negative for rash Respiratory: Positive for cough and shortness of breath GI: negative for nausea, vomiting, diarrhea, bright red blood per rectum, melena, or hematemesis GU: no hematuria, urgency, or frequency Neurologic: Positive for vision problems, negative for syncope, headache, or dizziness Heme: no easy bruising or bleeding Endo: negative for excessive thirst, thyroid disorder, or flushing Musculoskeletal: negative for joint pain or swelling, negative for myalgias  All other systems reviewed and are otherwise negative except as noted above.  Physical Exam: Blood pressure 150/92, pulse  78, temperature 97.8 F (36.6 C), temperature source Oral, resp. rate 20, height 5' 3.5" (1.613 m), weight 245 lb 9.5 oz (111.4 kg), last menstrual period 09/08/2015, SpO2 100 %. Pt is alert and oriented, pleasant woman in mild respiratory distress with talking, but comfortable at rest HEENT: normal Neck: JVP elevated to the angle of the jaw. Carotid upstrokes normal without bruits. No thyromegaly. Lungs: Diminished in the bases, otherwise clear CV: Apex is nonpalpable, RRR with distant heart sounds without murmur or gallop Abd: There is tense edema present throughout the abdominal wall, no tenderness Back: no CVA tenderness Ext: There is diffuse 3+ edema of the legs up to the thighs also involving both arms Neuro: CNII-XII intact             Strength intact = bilaterally    Labs:  Recent Labs  10/10/15 1446 10/10/15 2016 10/11/15 0150 10/11/15 0725  TROPONINI <0.03 0.03 <0.03 <0.03   Lab Results  Component Value Date   WBC 6.5 10/10/2015   HGB 9.8* 10/10/2015   HCT 31.1* 10/10/2015   MCV 88.1 10/10/2015   PLT 425 10/10/2015    Recent Labs Lab 10/10/15 2016  10/13/15 0325  NA  --   < > 137  K  --   < > 4.7  CL  --   < > 112*  CO2  --   < > 20*  BUN  --   < > 42*  CREATININE 3.11*  < > 3.58*  CALCIUM 8.7*  < > 8.3*  PROT 6.2*  --   --   BILITOT 0.5  --   --   ALKPHOS 117  --   --   ALT 37  --   --   AST 30  --   --   GLUCOSE  --   < > 235*  < > = values in this interval not displayed. No results found for: CHOL, HDL, LDLCALC, TRIG No results found for: DDIMER  Radiology/Studies:  Dg Chest 2 View  10/10/2015  CLINICAL DATA:  Short of breath and edema EXAM: CHEST  2 VIEW COMPARISON:  07/10/2013 FINDINGS: Lungs are severely under aerated. Small pleural effusions are noted on the lateral view. There is increased opacity towards the lung bases favored represent volume loss. Developing airspace disease is not excluded. Pulmonary vasculature is within normal limits.  IMPRESSION: Low lung volumes. Small bilateral pleural effusions Bibasilar volume loss. Electronically Signed   By: Jolaine Click M.D.   On: 10/10/2015 15:46   US Renal  10/10/2015  CLINICAL DATA:  Acute renal failure. EXAM: RENAL / URINARY TRACT ULTRASOUND COMPLETE COMPARISON:  07/10/2013 chest CT. FINDINGS: Right Kidney: Length: 9.3 cm. Echogenic right kidney with no right hydronephrosis and no right renal mass. No right renal cortical atrophy. Left Kidney: Length: 10.7 cm. Echogenic left kidney with no left hydronephrosis and no left renal mass. No left renal cortical atrophy. Incidentally noted is  a small left pleural effusion. Bladder: Appears normal for degree of bladder distention. IMPRESSION: 1. No hydronephrosis. 2. Echogenic normal size kidneys, indicating nonspecific renal parenchymal disease of uncertain chronicity. 3. Incidental small left pleural effusion. 4. Normal bladder. Electronically Signed   By: Delbert Phenix M.D.   On: 10/10/2015 18:59    EKG: Poor quality tracing with baseline artifact. Unable to interpret  Cardiac Studies: 2D Echo: Study Conclusions  - Left ventricle: The cavity size was normal. There was moderate concentric hypertrophy. Systolic function was mildly to moderately reduced. The estimated ejection fraction was in the range of 40% to 45%. Diffuse hypokinesis. - Mitral valve: There was mild regurgitation. - Pulmonary arteries: Systolic pressure was mildly increased. PA peak pressure: 39 mm Hg (S). - Pericardium, extracardiac: A small pericardial effusion was identified circumferential to the heart. There was no evidence of hemodynamic compromise. There was a left pleural effusion.  Impressions:  - The EF is worse than seen on most recent echo.  ASSESSMENT AND PLAN:  1. Acute systolic and diastolic heart failure, New York Heart Association functional class IV 2. Acute on chronic renal failure 3. Newly diagnosed cardiomyopathy, LVEF estimated  at 45% 4. Type 2 diabetes, uncontrolled 5. Hypertension associated with renal failure and heart failure, uncontrolled  The patient's history, laboratory data, and radiographic data is carefully reviewed. She has had some clinical improvement and diuresis with IV furosemide drip. I think it is important to accurately measure her urine output and she has so much excess volume, I would estimate at least an extra 50-75 pounds of fluid on board. A Foley catheter will be placed. I discussed her case with Dr. Thedore Mins of nephrology. Will continue to monitor her output closely with further considerations of increasing the dosage of her Lasix drip and/or adding metolazone to her regimen. She is obviously at high risk of worsening renal function and need for hemodialysis. She remains hypertensive and will increase hydralazine slowly, also will add isosorbide to her regimen.  I do not think she is a candidate for evaluation of coronary artery disease at this point. She would be at exceedingly high risk of renal failure with contrast administration which would be required for cardiac catheterization. I suspect her LV dysfunction is related to nonischemic cardiomyopathy in the setting of uncontrolled hypertension over the last several months.   Will follow with you - thanks  Signed, Tonny Bollman MD, [facility] 10/13/2015, 2:13 PM

## 2015-10-13 NOTE — Progress Notes (Signed)
[facility] [place] at [facility]   PATIENT NAME: Karen SailsCamille Rich    MR#:  [id number]  DATE OF BIRTH:  September 26, 1976  SUBJECTIVE:  No acute events. Creatinine slightly elevated than yesterday. Patient reports decreased edema and shortness of breath. REVIEW OF SYSTEMS:    Review of Systems  Constitutional: Negative for fever, chills and malaise/fatigue.  HENT: Negative for sore throat.   Eyes: Negative for blurred vision.  Respiratory: Positive for shortness of breath. Negative for cough, hemoptysis and wheezing.   Cardiovascular: Positive for leg swelling. Negative for chest pain, palpitations and PND.  Gastrointestinal: Negative for nausea, vomiting, abdominal pain, diarrhea and blood in stool.  Genitourinary: Negative for dysuria.  Musculoskeletal: Negative for back pain.  Neurological: Negative for dizziness, tremors and headaches.  Endo/Heme/Allergies: Does not bruise/bleed easily.    Tolerating Diet:yes      DRUG ALLERGIES:  No Known Allergies  VITALS:  Blood pressure 150/92, pulse 78, temperature 97.8 F (36.6 C), temperature source Oral, resp. rate 20, height 5' 3.5" (1.613 m), weight 111.4 kg (245 lb 9.5 oz), last menstrual period 09/08/2015, SpO2 100 %.  PHYSICAL EXAMINATION:   Physical Exam  Constitutional: She is oriented to person, place, and time and well-developed, well-nourished, and in no distress. No distress.  HENT:  Head: Normocephalic.  Eyes: No scleral icterus.  Neck: Normal range of motion. No JVD present. No tracheal deviation present.  Cardiovascular: Exam reveals no gallop and no friction rub.   No murmur heard. Pulmonary/Chest: Effort normal and breath sounds normal.  Abdominal: There is no tenderness.  Musculoskeletal: Normal range of motion. She exhibits edema.  Neurological: She is alert and oriented to person, place, and time.  Skin: Skin is warm. No rash noted. No erythema.  Psychiatric: Affect and judgment  normal.  Nursing note and vitals reviewed.     LABORATORY PANEL:   CBC  Recent Labs Lab 10/10/15 2016  WBC 6.5  HGB 9.8*  HCT 31.1*  PLT 425   ------------------------------------------------------------------------------------------------------------------  Chemistries   Recent Labs Lab 10/10/15 2016  10/13/15 0325  NA  --   < > 137  K  --   < > 4.7  CL  --   < > 112*  CO2  --   < > 20*  GLUCOSE  --   < > 235*  BUN  --   < > 42*  CREATININE 3.11*  < > 3.58*  CALCIUM 8.7*  < > 8.3*  MG 2.1  --   --   AST 30  --   --   ALT 37  --   --   ALKPHOS 117  --   --   BILITOT 0.5  --   --   < > = values in this interval not displayed. ------------------------------------------------------------------------------------------------------------------  Cardiac Enzymes  Recent Labs Lab 10/10/15 2016 10/11/15 0150 10/11/15 0725  TROPONINI 0.03 <0.03 <0.03   ------------------------------------------------------------------------------------------------------------------  RADIOLOGY:  No results found.   ASSESSMENT AND PLAN:   39 year old female with a history of type 2 diabetes, hyperlipidemia and chronic kidney disease stage III presents to the ER with ascites.   1. Generalized anasarca: This is likely secondary to renal disease and acute systolic heart failure. Patient will continue on Lasix drip and albumin infusion. Input and output will be monitored. Appreciate nephrology consultation. Echocardiogram shows EF 40-45%.  2. Type 2 diabetes: Continue sliding scale insulin. Continue Lantus 12 units at bedtime. A1c is 6.6  3. Abnormal TSH:  T3 and T4 are within normal limits.  4. Essential hypertension Blood pressure improved with Lasix drip. Continue Coreg  5. Pressure ulcer present on admission right malleolus with skin breakdown to buttocks: Continue wound care as per consultation. Cleanse wounds to right calf and right ankle with NS and pat gently dry. Apply  Santyl to wound bed, 1/8 inch thickness (opaque). COver with NS moist gauze. COver with 4x4 gauze, kerlix and tape. Change daily. Once edema to abdomen has improved, would like to consider Unnas boots.   6. Acute renal failure on chronic kidney disease stage II/3: Baseline creatinine  1.2. Unclear etiology at this time. Continue Lasix drip with albumin for diuresis as per nephrology consultation.  7. Cardiomyopathy with EF 40-45%: Continue Coreg. No ACE inhibitor/ARB due to acute renal failure. Cardiology consultation will be obtained. Continue Lasix drip.   Management plans discussed with the patient and she is in agreement.  CODE STATUS: FULL  TOTAL TIME TAKING CARE OF THIS PATIENT: 20 minutes.     POSSIBLE D/C 3-4 days, DEPENDING ON CLINICAL CONDITION.   Zakaree Mcclenahan M.D on 10/13/2015 at 11:52 AM  Between 7am to 6pm - Pager - [phone] After 6pm go to [email or web address] - password [email or web address]  [facility]  Office  [phone]  CC: Primary care physician; Sherrie Mustache, MD  Note: This dictation was prepared with Dragon dictation along with smaller phrase technology. Any transcriptional errors that result from this process are unintentional.

## 2015-10-14 DIAGNOSIS — I5041 Acute combined systolic (congestive) and diastolic (congestive) heart failure: Secondary | ICD-10-CM

## 2015-10-14 LAB — CBC
HEMATOCRIT: 26.8 % — AB (ref 35.0–47.0)
Hemoglobin: 8.5 g/dL — ABNORMAL LOW (ref 12.0–16.0)
MCH: 28 pg (ref 26.0–34.0)
MCHC: 31.8 g/dL — AB (ref 32.0–36.0)
MCV: 88.2 fL (ref 80.0–100.0)
Platelets: 322 10*3/uL (ref 150–440)
RBC: 3.03 MIL/uL — ABNORMAL LOW (ref 3.80–5.20)
RDW: 16.7 % — AB (ref 11.5–14.5)
WBC: 5.8 10*3/uL (ref 3.6–11.0)

## 2015-10-14 LAB — BASIC METABOLIC PANEL
ANION GAP: 7 (ref 5–15)
BUN: 47 mg/dL — ABNORMAL HIGH (ref 6–20)
CHLORIDE: 112 mmol/L — AB (ref 101–111)
CO2: 20 mmol/L — AB (ref 22–32)
Calcium: 8.4 mg/dL — ABNORMAL LOW (ref 8.9–10.3)
Creatinine, Ser: 3.55 mg/dL — ABNORMAL HIGH (ref 0.44–1.00)
GFR calc Af Amer: 17 mL/min — ABNORMAL LOW (ref >60)
GFR, EST NON AFRICAN AMERICAN: 15 mL/min — AB (ref >60)
GLUCOSE: 151 mg/dL — AB (ref 65–99)
POTASSIUM: 4.5 mmol/L (ref 3.5–5.1)
Sodium: 139 mmol/L (ref 135–145)

## 2015-10-14 LAB — GLUCOSE, CAPILLARY
Glucose-Capillary: 123 mg/dL — ABNORMAL HIGH (ref 65–99)
Glucose-Capillary: 188 mg/dL — ABNORMAL HIGH (ref 65–99)
Glucose-Capillary: 129 mg/dL — ABNORMAL HIGH (ref 65–99)

## 2015-10-14 LAB — ANA W/REFLEX IF POSITIVE: Anti Nuclear Antibody(ANA): NEGATIVE

## 2015-10-14 MED ORDER — METOLAZONE 2.5 MG PO TABS
5.0000 mg | ORAL_TABLET | Freq: Every day | ORAL | Status: AC
  Administered 2015-10-14 – 2015-10-16 (×3): 5 mg via ORAL
  Filled 2015-10-14 (×3): qty 2

## 2015-10-14 MED ORDER — INFLUENZA VAC SPLIT QUAD 0.5 ML IM SUSY
0.5000 mL | PREFILLED_SYRINGE | INTRAMUSCULAR | Status: AC
  Administered 2015-10-15: 0.5 mL via INTRAMUSCULAR
  Filled 2015-10-14: qty 0.5

## 2015-10-14 NOTE — Progress Notes (Signed)
Subjective:  Patient was started on Lasix drip 11/3 . Foley placed 11/5 for accurate UOP measurement She appears to have a good response. Foley bag has 500 cc since this AM (close to 200 cc/hr) She feels like her abdomen and legs are less swollen Appetite is good Serum creatinine slightly about the same at 3.44->3.58->>3.55  Objective:  Vital signs in last 24 hours:  Temp:  [97.7 F (36.5 C)-98.3 F (36.8 C)] 97.7 F (36.5 C) (11/06 0359) Pulse Rate:  [78-80] 78 (11/06 0800) Resp:  [20] 20 (11/06 0800) BP: (147-165)/(76-99) 160/87 mmHg (11/06 0800) SpO2:  [99 %-100 %] 99 % (11/06 0800) Weight:  [110.814 kg (244 lb 4.8 oz)-111.4 kg (245 lb 9.5 oz)] 110.814 kg (244 lb 4.8 oz) (11/06 0359)  Weight change:  Filed Weights   10/10/15 1939 10/13/15 1038 10/14/15 0359  Weight: 112.492 kg (248 lb) 111.4 kg (245 lb 9.5 oz) 110.814 kg (244 lb 4.8 oz)    Intake/Output:    Intake/Output Summary (Last 24 hours) at 10/14/15 1024 Last data filed at 10/14/15 0549  Gross per 24 hour  Intake    600 ml  Output   1775 ml  Net  -1175 ml     Physical Exam: General: Sitting up, no acute distress, anasarca  HEENT Moist mucous membranes, anicteric  Neck supple  Pulm/lungs Bilateral crackles  CVS/Heart Regular rhythm, no rub or gallop  Abdomen:  Soft, nontender, distended  Extremities: 2-3+ pitting edema  Neurologic: Alert, oriented, ambulatory  Skin: excoriations  Access:        Basic Metabolic Panel:   Recent Labs Lab 10/10/15 1446 10/10/15 2016 10/12/15 0602 10/13/15 0325 10/14/15 0258  NA 138  --  138 137 139  K 4.7  --  4.4 4.7 4.5  CL 111  --  113* 112* 112*  CO2 19*  --  20* 20* 20*  GLUCOSE 194*  --  107* 235* 151*  BUN 36*  --  38* 42* 47*  CREATININE 3.16* 3.11* 3.44* 3.58* 3.55*  CALCIUM 8.6* 8.7* 8.4* 8.3* 8.4*  MG  --  2.1  --   --   --      CBC:  Recent Labs Lab 10/10/15 1446 10/10/15 2016 10/14/15 0258  WBC 6.4 6.5 5.8  NEUTROABS 4.9  --   --    HGB 9.8* 9.8* 8.5*  HCT 31.0* 31.1* 26.8*  MCV 88.1 88.1 88.2  PLT 410 425 322      Microbiology:  Recent Results (from the past 720 hour(s))  C difficile quick scan w PCR reflex     Status: None   Collection Time: 10/10/15 10:21 PM  Result Value Ref Range Status   C Diff antigen NEGATIVE NEGATIVE Final   C Diff toxin NEGATIVE NEGATIVE Final   C Diff interpretation Negative for C. difficile  Final  C difficile quick scan w PCR reflex     Status: None   Collection Time: 10/12/15 10:35 AM  Result Value Ref Range Status   C Diff antigen NEGATIVE NEGATIVE Final   C Diff toxin NEGATIVE NEGATIVE Final   C Diff interpretation Negative for C. difficile  Final    Coagulation Studies: No results for input(s): LABPROT, INR in the last 72 hours.  Urinalysis: No results for input(s): COLORURINE, LABSPEC, PHURINE, GLUCOSEU, HGBUR, BILIRUBINUR, KETONESUR, PROTEINUR, UROBILINOGEN, NITRITE, LEUKOCYTESUR in the last 72 hours.  Invalid input(s): APPERANCEUR    Imaging: No results found.   Medications:   . furosemide (LASIX) infusion  10 mg/hr (10/12/15 1435)   . carvedilol  12.5 mg Oral BID WC  . collagenase   Topical Daily  . enoxaparin (LOVENOX) injection  30 mg Subcutaneous Q24H  . hydrALAZINE  37.5 mg Oral 3 times per day  . insulin aspart  0-5 Units Subcutaneous QHS  . insulin aspart  0-9 Units Subcutaneous TID WC  . insulin glargine  12 Units Subcutaneous Daily  . isosorbide dinitrate  10 mg Oral TID  . metolazone  5 mg Oral Daily  . sodium chloride  3 mL Intravenous Q12H  . sodium chloride  3 mL Intravenous Q12H   sodium chloride, acetaminophen **OR** acetaminophen, hydrALAZINE, ondansetron **OR** ondansetron (ZOFRAN) IV, sodium chloride  Assessment/ Plan:  39 y.o. female with medical problems of Diabetes type II (since Age 51, poorly controlled) with severe complications including retinopathy and kidney disease, hypertension, obesity who was admitted to [facility] on  10/10/2015 for evaluation of acute renal failure and anasarca.  1. Acute renal failure.(Unclear cause) vs progression of diabetic nephropathy to stage 4. Differential includes diabetic nephropathy, hypertensive nephrosclerosis, FSGS 2. Chronic kidney disease stage II/III. Baseline creatinine 1.2/GFR greater than 60 in September 2015 3. Generalized edema and anasarca 4. Diabetes type 2 with chronic kidney disease 5.  Proteinuria. Urine protein/creatinine ratio 5 g  Plan: IV Lasix infusion with albumin support for diuresis Continue to monitor BMP for electrolytes and renal function Hemoglobin A1c 6.6% Avoid ACE inhibitor at this time  screening ANA pending Case discussed with Dr. Excell Seltzer of cardiology. Currently patient appears to have satisfactory response to IV Lasix therefore continue drip at current rate. She was given metolazone 5 mg by mouth 1 today Goal rate of diuresis is between 3-4 L per day. If not achieved, can consider increasing the dose of Lasix drip and/or add metolazone on a daily basis.   LOS: 4 Kyanne Rials 11/6/201610:24 AM

## 2015-10-14 NOTE — Progress Notes (Signed)
Pt on bedside commode. Family in room. Pt feeling ok. Reviewed notes of Dr Thedore MinsSingh and Dr Juliene PinaMody. Plans as outlined with continued IV lasix drip, albumin, metolazone given this am, and close follow-up of renal function. Not much to add from cardiac perspective at this point. Pt has a long way to go with fluid removal and hopefully will tolerate diuresis without requiring hemodialysis. Would continue current cardiac medications.   Karen Rich, Karen Rich 10/14/2015 1:23 PM

## 2015-10-14 NOTE — Progress Notes (Signed)
[facility] [place] [facility]   PATIENT NAME: Karen Rich    MR#:  [id number]  DATE OF BIRTH:  [birth year]  SUBJECTIVE:  Patient is doing fairly well. Patient had a Foley placed for accurate urine output measurement. Patient feels less edematous. REVIEW OF SYSTEMS:    Review of Systems  Constitutional: Negative for fever, chills and malaise/fatigue.  HENT: Negative for sore throat.   Eyes: Negative for blurred vision.  Respiratory: Positive for shortness of breath. Negative for cough, hemoptysis and wheezing.   Cardiovascular: Positive for leg swelling. Negative for chest pain, palpitations and PND.  Gastrointestinal: Negative for nausea, vomiting, abdominal pain, diarrhea and blood in stool.  Genitourinary: Negative for dysuria.  Musculoskeletal: Negative for back pain.  Neurological: Negative for dizziness, tremors and headaches.  Endo/Heme/Allergies: Does not bruise/bleed easily.    Tolerating Diet:yes      DRUG ALLERGIES:  No Known Allergies  VITALS:  Blood pressure 121/72, pulse 77, temperature 97.7 F (36.5 C), temperature source Oral, resp. rate 20, height 5' 3.5" (1.613 m), weight 110.814 kg (244 lb 4.8 oz), last menstrual period 09/08/2015, SpO2 100 %.  PHYSICAL EXAMINATION:   Physical Exam  Constitutional: She is oriented to person, place, and time and well-developed, well-nourished, and in no distress. No distress.  HENT:  Head: Normocephalic.  Eyes: No scleral icterus.  Neck: Normal range of motion. No JVD present. No tracheal deviation present.  Cardiovascular: Exam reveals no gallop and no friction rub.   No murmur heard. Pulmonary/Chest: Effort normal and breath sounds normal.  Abdominal: There is no tenderness.  Musculoskeletal: Normal range of motion. She exhibits edema.  Neurological: She is alert and oriented to person, place, and time.  Skin: Skin is warm. No rash noted. No erythema.  Psychiatric: Affect and  judgment normal.  Nursing note and vitals reviewed.     LABORATORY PANEL:   CBC  Recent Labs Lab 10/14/15 0258  WBC 5.8  HGB 8.5*  HCT 26.8*  PLT 322   ------------------------------------------------------------------------------------------------------------------  Chemistries   Recent Labs Lab 10/10/15 2016  10/14/15 0258  NA  --   < > 139  K  --   < > 4.5  CL  --   < > 112*  CO2  --   < > 20*  GLUCOSE  --   < > 151*  BUN  --   < > 47*  CREATININE 3.11*  < > 3.55*  CALCIUM 8.7*  < > 8.4*  MG 2.1  --   --   AST 30  --   --   ALT 37  --   --   ALKPHOS 117  --   --   BILITOT 0.5  --   --   < > = values in this interval not displayed. ------------------------------------------------------------------------------------------------------------------  Cardiac Enzymes  Recent Labs Lab 10/10/15 2016 10/11/15 0150 10/11/15 0725  TROPONINI 0.03 <0.03 <0.03   ------------------------------------------------------------------------------------------------------------------  RADIOLOGY:  No results found.   ASSESSMENT AND PLAN:   39 year old female with a history of type 2 diabetes, hyperlipidemia and chronic kidney disease stage III presents to the ER with ascites and acute on chronic renal failure.   1. 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 Patient will continue on Lasix drip and albumin infusion. She was given metolazone this morning. Goal rate of diuresis is between 3-4 L a day. As per nephrology if this is not achieved  we may have to increase to dose of Lasix drip and/or add metolazone on a daily basis. Input and output will be monitored. Appreciate nephrology consultation.   2. Type 2 diabetes: Continue sliding scale insulin. Continue Lantus 12 units at bedtime. A1c is 6.6  3. Abnormal TSH:  T3 and T4 are within normal limits.  4. Essential hypertension Blood pressure  improved with Lasix drip. Continue Coreg  5. Pressure ulcer present on admission right malleolus with skin breakdown to buttocks: Continue wound care as per consultation. Cleanse wounds to right calf and right ankle with NS and pat gently dry. Apply Santyl to wound bed, 1/8 inch thickness (opaque). COver with NS moist gauze. COver with 4x4 gauze, kerlix and tape. Change daily. Once edema to abdomen has improved, would like to consider Unnas boots.   6. Acute renal failure on chronic kidney disease stage II/3: Baseline creatinine  1.2. Unclear etiology at this time, but likely due to underlying blood pressure and diabetes.. Continue Lasix drip with albumin for diuresis as per nephrology consultation.  7. Cardiomyopathy with EF 40-45%: Appreciate cardiology consultation. Continue to monitor input and output. She is not a candidate for evaluation of coronary artery disease at this point due to her renal failure. It is suspected that her LV dysfunction is most likely nonischemic in nature. Continue Coreg and hydralazine. No ACE inhibitor/ARB due to acute renal failure.    Management plans discussed with the patient and she is in agreement.    CODE STATUS: FULL  TOTAL TIME TAKING CARE OF THIS PATIENT: 20 minutes.     POSSIBLE D/C 3-4 days, DEPENDING ON CLINICAL CONDITION.   Renate Danh M.D on 10/14/2015 at 11:48 AM  Between 7am to 6pm - Pager - [phone] After 6pm go to [email or web address] - password [email or web address]  [facility]  Office  [phone]  CC: Primary care physician; Sherrie Mustache, MD  Note: This dictation was prepared with Dragon dictation along with smaller phrase technology. Any transcriptional errors that result from this process are unintentional.

## 2015-10-15 ENCOUNTER — Inpatient Hospital Stay: Payer: Medicaid Other

## 2015-10-15 DIAGNOSIS — I5023 Acute on chronic systolic (congestive) heart failure: Secondary | ICD-10-CM

## 2015-10-15 LAB — BLOOD GAS, ARTERIAL
ALLENS TEST (PASS/FAIL): POSITIVE — AB
Acid-base deficit: 5.1 mmol/L — ABNORMAL HIGH (ref 0.0–2.0)
Bicarbonate: 21.2 mEq/L (ref 21.0–28.0)
FIO2: 0.4
MECHANICAL RATE: 14
O2 SAT: 95.3 %
PCO2 ART: 43 mmHg (ref 32.0–48.0)
PEEP/CPAP: 5 cmH2O
Patient temperature: 37
VT: 500 mL
pH, Arterial: 7.3 — ABNORMAL LOW (ref 7.350–7.450)
pO2, Arterial: 85 mmHg (ref 83.0–108.0)

## 2015-10-15 LAB — CBC
HCT: 25.4 % — ABNORMAL LOW (ref 35.0–47.0)
HEMOGLOBIN: 8.6 g/dL — AB (ref 12.0–16.0)
MCH: 29.8 pg (ref 26.0–34.0)
MCHC: 33.6 g/dL (ref 32.0–36.0)
MCV: 88.6 fL (ref 80.0–100.0)
PLATELETS: 287 10*3/uL (ref 150–440)
RBC: 2.87 MIL/uL — AB (ref 3.80–5.20)
RDW: 16.8 % — ABNORMAL HIGH (ref 11.5–14.5)
WBC: 7 10*3/uL (ref 3.6–11.0)

## 2015-10-15 LAB — GLUCOSE, CAPILLARY
GLUCOSE-CAPILLARY: 112 mg/dL — AB (ref 65–99)
GLUCOSE-CAPILLARY: 238 mg/dL — AB (ref 65–99)
GLUCOSE-CAPILLARY: 214 mg/dL — AB (ref 65–99)
GLUCOSE-CAPILLARY: 207 mg/dL — AB (ref 65–99)
Glucose-Capillary: 151 mg/dL — ABNORMAL HIGH (ref 65–99)

## 2015-10-15 LAB — BASIC METABOLIC PANEL
Anion gap: 4 — ABNORMAL LOW (ref 5–15)
BUN: 50 mg/dL — AB (ref 6–20)
CHLORIDE: 115 mmol/L — AB (ref 101–111)
CO2: 22 mmol/L (ref 22–32)
CREATININE: 3.76 mg/dL — AB (ref 0.44–1.00)
Calcium: 8.6 mg/dL — ABNORMAL LOW (ref 8.9–10.3)
GFR, EST AFRICAN AMERICAN: 16 mL/min — AB (ref >60)
GFR, EST NON AFRICAN AMERICAN: 14 mL/min — AB (ref >60)
Glucose, Bld: 98 mg/dL (ref 65–99)
POTASSIUM: 4.3 mmol/L (ref 3.5–5.1)
SODIUM: 141 mmol/L (ref 135–145)

## 2015-10-15 LAB — MAGNESIUM: Magnesium: 2.1 mg/dL (ref 1.7–2.4)

## 2015-10-15 LAB — COMPREHENSIVE METABOLIC PANEL
ALBUMIN: 2 g/dL — AB (ref 3.5–5.0)
ALK PHOS: 102 U/L (ref 38–126)
ALT: 31 U/L (ref 14–54)
AST: 31 U/L (ref 15–41)
Anion gap: 7 (ref 5–15)
BUN: 50 mg/dL — ABNORMAL HIGH (ref 6–20)
CALCIUM: 8.8 mg/dL — AB (ref 8.9–10.3)
CHLORIDE: 106 mmol/L (ref 101–111)
CO2: 22 mmol/L (ref 22–32)
CREATININE: 3.63 mg/dL — AB (ref 0.44–1.00)
GFR calc Af Amer: 17 mL/min — ABNORMAL LOW (ref >60)
GFR calc non Af Amer: 15 mL/min — ABNORMAL LOW (ref >60)
GLUCOSE: 248 mg/dL — AB (ref 65–99)
Potassium: 4.5 mmol/L (ref 3.5–5.1)
SODIUM: 135 mmol/L (ref 135–145)
Total Bilirubin: 0.4 mg/dL (ref 0.3–1.2)
Total Protein: 5.7 g/dL — ABNORMAL LOW (ref 6.5–8.1)

## 2015-10-15 LAB — TRIGLYCERIDES: Triglycerides: 896 mg/dL — ABNORMAL HIGH (ref <150)

## 2015-10-15 MED ORDER — SODIUM CHLORIDE 0.9 % IJ SOLN
10.0000 mL | Freq: Two times a day (BID) | INTRAMUSCULAR | Status: DC
  Administered 2015-10-15: 20 mL
  Administered 2015-10-15 – 2015-10-18 (×6): 10 mL
  Administered 2015-10-19: 20 mL
  Administered 2015-10-19 – 2015-10-20 (×3): 10 mL
  Administered 2015-10-21: 20 mL
  Administered 2015-10-21: 10 mL
  Administered 2015-10-22: 20 mL
  Administered 2015-10-22 – 2015-10-24 (×4): 10 mL
  Administered 2015-10-24: 20 mL
  Administered 2015-10-25 – 2015-10-27 (×6): 10 mL
  Administered 2015-10-28 – 2015-10-29 (×3): 20 mL
  Administered 2015-10-30: 30 mL
  Administered 2015-10-30 – 2015-11-01 (×4): 10 mL

## 2015-10-15 MED ORDER — FUROSEMIDE 10 MG/ML IJ SOLN
10.0000 mg/h | INTRAVENOUS | Status: AC
  Administered 2015-10-15: 10 mg/h via INTRAVENOUS
  Filled 2015-10-15: qty 25

## 2015-10-15 MED ORDER — PROPOFOL 1000 MG/100ML IV EMUL
5.0000 ug/kg/min | INTRAVENOUS | Status: DC
  Administered 2015-10-15: 5 ug/kg/min via INTRAVENOUS
  Filled 2015-10-15: qty 100

## 2015-10-15 MED ORDER — LOPERAMIDE HCL 2 MG PO CAPS
2.0000 mg | ORAL_CAPSULE | Freq: Four times a day (QID) | ORAL | Status: DC | PRN
  Administered 2015-10-15 – 2015-10-19 (×3): 2 mg via ORAL
  Filled 2015-10-15 (×3): qty 1

## 2015-10-15 MED ORDER — LOPERAMIDE HCL 2 MG PO CAPS: 2.0000 mg | ORAL_CAPSULE | ORAL | Status: DC | PRN

## 2015-10-15 MED ORDER — FENTANYL CITRATE (PF) 100 MCG/2ML IJ SOLN: 100.0000 ug | INTRAMUSCULAR | Status: DC | PRN

## 2015-10-15 MED ORDER — PROPOFOL 1000 MG/100ML IV EMUL
0.0000 ug/kg/min | INTRAVENOUS | Status: DC
  Administered 2015-10-15: 40 ug/kg/min via INTRAVENOUS
  Administered 2015-10-16: 34.956 ug/kg/min via INTRAVENOUS
  Administered 2015-10-16: 30 ug/kg/min via INTRAVENOUS
  Administered 2015-10-16: 35 ug/kg/min via INTRAVENOUS
  Administered 2015-10-17: 25 ug/kg/min via INTRAVENOUS
  Administered 2015-10-17: 24.969 ug/kg/min via INTRAVENOUS
  Administered 2015-10-18: 15 ug/kg/min via INTRAVENOUS
  Filled 2015-10-15 (×8): qty 100

## 2015-10-15 MED ORDER — SODIUM CHLORIDE 0.9 % IJ SOLN
10.0000 mL | INTRAMUSCULAR | Status: DC | PRN
  Administered 2015-10-21 – 2015-10-25 (×3): 10 mL
  Filled 2015-10-15 (×3): qty 40

## 2015-10-15 MED ORDER — FUROSEMIDE 10 MG/ML IJ SOLN
80.0000 mg | Freq: Three times a day (TID) | INTRAMUSCULAR | Status: DC
  Administered 2015-10-15: 80 mg via INTRAVENOUS
  Filled 2015-10-15: qty 8

## 2015-10-15 MED ORDER — FAMOTIDINE IN NACL 20-0.9 MG/50ML-% IV SOLN
20.0000 mg | Freq: Two times a day (BID) | INTRAVENOUS | Status: DC
  Administered 2015-10-15 – 2015-10-18 (×6): 20 mg via INTRAVENOUS
  Filled 2015-10-15 (×8): qty 50

## 2015-10-15 MED ORDER — ALBUTEROL SULFATE (2.5 MG/3ML) 0.083% IN NEBU: 2.5000 mg | INHALATION_SOLUTION | RESPIRATORY_TRACT | Status: DC | PRN

## 2015-10-15 NOTE — Progress Notes (Signed)
Spoke with dr. Juliene PinaMody to make aware patient no longer has an iv assess and unable to start iv. Also made md aware patient continue to have loose stools and has nothing ordered. Per md order picc line and imodium prn.

## 2015-10-15 NOTE — Progress Notes (Signed)
Subjective:   Patient lost her IV access this morning. Was on furosemide gtt at /hr. Good response: UOP of last 24 hours and net negative 4.3 litres.  Creatinine of 3.76 (3.55)  Objective:  Vital signs in last 24 hours:  Temp:  [98.1 F (36.7 C)-98.2 F (36.8 C)] 98.1 F (36.7 C) (11/07 0445) Pulse Rate:  [77-82] 80 (11/07 0445) Resp:  [20] 20 (11/07 0445) BP: (121-158)/(72-82) 149/74 mmHg (11/07 0445) SpO2:  [98 %-100 %] 100 % (11/07 0445) Weight:  [106.777 kg (235 lb 6.4 oz)] 106.777 kg (235 lb 6.4 oz) (11/07 0653)  Weight change: -4.623 kg (-10 lb 3.1 oz) Filed Weights   10/13/15 1038 10/14/15 0359 10/15/15 0653  Weight: 111.4 kg (245 lb 9.5 oz) 110.814 kg (244 lb 4.8 oz) 106.777 kg (235 lb 6.4 oz)    Intake/Output:    Intake/Output Summary (Last 24 hours) at 10/15/15 1005 Last data filed at 10/15/15 0915  Gross per 24 hour  Intake 1116.33 ml  Output   2325 ml  Net -1208.67 ml     Physical Exam: General: no acute distress, anasarca  HEENT Moist mucous membranes, anicteric  Neck supple  Pulm/lungs Bilateral crackles basilar  CVS/Heart Regular rhythm, no rub or gallop  Abdomen:  Nontender,+distended  Extremities: 3+ pitting edema all four extremities  Neurologic: Alert, oriented, ambulatory  Skin: excoriations          Basic Metabolic Panel:   Recent Labs Lab 10/10/15 1446 10/10/15 2016 10/12/15 0602 10/13/15 0325 10/14/15 0258 10/15/15 0315  NA 138  --  138 137 139 141  K 4.7  --  4.4 4.7 4.5 4.3  CL 111  --  113* 112* 112* 115*  CO2 19*  --  20* 20* 20* 22  GLUCOSE 194*  --  107* 235* 151* 98  BUN 36*  --  38* 42* 47* 50*  CREATININE 3.16* 3.11* 3.44* 3.58* 3.55* 3.76*  CALCIUM 8.6* 8.7* 8.4* 8.3* 8.4* 8.6*  MG  --  2.1  --   --   --   --      CBC:  Recent Labs Lab 10/10/15 1446 10/10/15 2016 10/14/15 0258  WBC 6.4 6.5 5.8  NEUTROABS 4.9  --   --   HGB 9.8* 9.8* 8.5*  HCT 31.0* 31.1* 26.8*  MCV 88.1 88.1 88.2  PLT 410  425 322      Microbiology:  Recent Results (from the past 720 hour(s))  C difficile quick scan w PCR reflex     Status: None   Collection Time: 10/10/15 10:21 PM  Result Value Ref Range Status   C Diff antigen NEGATIVE NEGATIVE Final   C Diff toxin NEGATIVE NEGATIVE Final   C Diff interpretation Negative for C. difficile  Final  C difficile quick scan w PCR reflex     Status: None   Collection Time: 10/12/15 10:35 AM  Result Value Ref Range Status   C Diff antigen NEGATIVE NEGATIVE Final   C Diff toxin NEGATIVE NEGATIVE Final   C Diff interpretation Negative for C. difficile  Final    Coagulation Studies: No results for input(s): LABPROT, INR in the last 72 hours.  Urinalysis: No results for input(s): COLORURINE, LABSPEC, PHURINE, GLUCOSEU, HGBUR, BILIRUBINUR, KETONESUR, PROTEINUR, UROBILINOGEN, NITRITE, LEUKOCYTESUR in the last 72 hours.  Invalid input(s): APPERANCEUR    Imaging: No results found.   Medications:     . carvedilol  12.5 mg Oral BID WC  . collagenase   Topical  Daily  . enoxaparin (LOVENOX) injection  30 mg Subcutaneous Q24H  . furosemide  80 mg Intravenous Q8H  . hydrALAZINE  37.5 mg Oral 3 times per day  . Influenza vac split quadrivalent PF  0.5 mL Intramuscular Tomorrow-1000  . insulin aspart  0-5 Units Subcutaneous QHS  . insulin aspart  0-9 Units Subcutaneous TID WC  . insulin glargine  12 Units Subcutaneous Daily  . isosorbide dinitrate  10 mg Oral TID  . metolazone  5 mg Oral Daily   sodium chloride, acetaminophen **OR** acetaminophen, hydrALAZINE, loperamide, ondansetron **OR** ondansetron (ZOFRAN) IV, sodium chloride  Assessment/ Plan:  39 y.o. female with medical problems of Diabetes type II (since Age 39, poorly controlled) with severe complications including retinopathy and kidney disease, hypertension, obesity who [facility] for evaluation of acute renal failure and anasarca.  1. 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 Acute renal failure from acute cardiorenal syndrome from systolic acute congestive heart failure. Echocardiogram from 11/3 with EF of 40-45% Chronic kidney disease with proteinuria (nephrotic range: 4.9 grams) secondary to diabetic nephropathy. 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 Baseline creatinine of 1.2 in 08/2014.  - monitor serum electrolytes. No acute indication for dialysis.  - Renally dose all medication - not a candidate for ACE-I/ARB at this time.  - Screening ANA pending.   2. Acute exacerbation of systolic congestive heart failure with anasarca: currently with more than 6 litres of fluid on.  Tolerating furosemide gtt well but now without IV access.  - Will have central line placed - change IV furosemide 80mg  q 8  - continue metolazone.  - monitor urine output, volume status  3. 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  - Continue insulin regimen and glucose control.   4. Hypertension: volume driven. Blood pressure better controlled.  - Current regimen of carvedilol, isosorbide dinitrate, hydralazine and diureteics.  - low salt diet.    LOS: 5 Karen Rich 11/7/201610:05 AM

## 2015-10-15 NOTE — Progress Notes (Signed)
Spoke with dr. Wynelle Linkkolluru per [facility] vascular request to clarify picc line. Per dr. Wynelle Linkkolluru discontinue ordered for picc line and discontinue lasix drip. He will come to the floor to assess patient md aware patient has no access at this time [employer] Monica Montelongo

## 2015-10-15 NOTE — Progress Notes (Signed)
   10/15/15 1845  Clinical Encounter Type  Visited With Patient  Visit Type Code  Referral From Nurse  Consult/Referral To Chaplain  Paged to Pt's room. Active code, Pt transferred to ICU. Konrad PentaChap Rick Alicha Raspberry [phone]

## 2015-10-15 NOTE — Progress Notes (Signed)
[facility] - [place] at [facility]   PATIENT NAME: Karen Rich    MR#:  [id number]  DATE OF BIRTH:  07/13/1976  SUBJECTIVE:  Patient lost IV access and therefore will need central access. Patient continues to have edema however she reports it is improved.  REVIEW OF SYSTEMS:    Review of Systems  Constitutional: Negative for fever, chills and malaise/fatigue.  HENT: Negative for sore throat.   Eyes: Negative for blurred vision.  Respiratory: Positive for shortness of breath. Negative for cough, hemoptysis and wheezing.   Cardiovascular: Positive for leg swelling. Negative for chest pain, palpitations and PND.  Gastrointestinal: Negative for nausea, vomiting, abdominal pain, diarrhea and blood in stool.  Genitourinary: Negative for dysuria.  Musculoskeletal: Negative for back pain.  Neurological: Negative for dizziness, tremors and headaches.  Endo/Heme/Allergies: Does not bruise/bleed easily.    Tolerating Diet:yes      DRUG ALLERGIES:  No Known Allergies  VITALS:  Blood pressure 137/70, pulse 77, temperature 97.7 F (36.5 C), temperature source Oral, resp. rate 21, height 5' 3.5" (1.613 m), weight 106.777 kg (235 lb 6.4 oz), last menstrual period 09/08/2015, SpO2 95 %.  PHYSICAL EXAMINATION:   Physical Exam  Constitutional: She is oriented to person, place, and time and well-developed, well-nourished, and in no distress. No distress.  HENT:  Head: Normocephalic.  Eyes: No scleral icterus.  Neck: Normal range of motion. No JVD present. No tracheal deviation present.  Cardiovascular: Exam reveals no gallop and no friction rub.   No murmur heard. Pulmonary/Chest: Effort normal and breath sounds normal.  Abdominal: There is no tenderness.  Musculoskeletal: Normal range of motion. She exhibits edema.  Neurological: She is alert and oriented to person, place, and time.  Skin: Skin is warm. No rash noted. No erythema.  Psychiatric: Affect and  judgment normal.  Nursing note and vitals reviewed.     LABORATORY PANEL:   CBC  Recent Labs Lab 10/14/15 0258  WBC 5.8  HGB 8.5*  HCT 26.8*  PLT 322   ------------------------------------------------------------------------------------------------------------------  Chemistries   Recent Labs Lab 10/10/15 2016  10/15/15 0315  NA  --   < > 141  K  --   < > 4.3  CL  --   < > 115*  CO2  --   < > 22  GLUCOSE  --   < > 98  BUN  --   < > 50*  CREATININE 3.11*  < > 3.76*  CALCIUM 8.7*  < > 8.6*  MG 2.1  --   --   AST 30  --   --   ALT 37  --   --   ALKPHOS 117  --   --   BILITOT 0.5  --   --   < > = values in this interval not displayed. ------------------------------------------------------------------------------------------------------------------  Cardiac Enzymes  Recent Labs Lab 10/10/15 2016 10/11/15 0150 10/11/15 0725  TROPONINI 0.03 <0.03 <0.03   ------------------------------------------------------------------------------------------------------------------  RADIOLOGY:  No results found.   ASSESSMENT AND PLAN:   39 year old female with a history of type 2 diabetes, hyperlipidemia and chronic kidney disease stage III presents to the ER with ascites and acute on chronic renal failure.   1. 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 Patient was on on Lasix drip and albumin infusion as well as metolazone. Lasix drip was discontinued this morning and now patient is on Lasix 80 IV every 8 hours  and metolazone.  Goal rate of diuresis is between 3-4 L a day. Continue to monitor Input and output. Appreciate nephrology consultation.   2. Type 2 diabetes: Continue sliding scale insulin. Continue Lantus 12 units at bedtime. A1c is 6.6  3. Abnormal TSH:  T3 and T4 are within normal limits.  4. Essential hypertension Blood pressure improved with Lasix drip. Continue Coreg and  Imdur  5. Pressure ulcer present on admission right malleolus with skin breakdown to buttocks: Continue wound care as per consultation. Cleanse wounds to right calf and right ankle with NS and pat gently dry. Apply Santyl to wound bed, 1/8 inch thickness (opaque). COver with NS moist gauze. COver with 4x4 gauze, kerlix and tape. Change daily. Once edema to abdomen has improved, would like to consider Unnas boots.   6. Acute renal failure on chronic kidney disease stage II/3: Baseline creatinine  1.2. Unclear etiology at this time, but likely due to underlying blood pressure and diabetes.. Continue Lasix and metolazone for diuresis as per nephrology consultation.  7. Cardiomyopathy with EF 40-45%: Appreciate cardiology consultation. Continue to monitor input and output. She is not a candidate for evaluation of coronary artery disease at this point due to her renal failure. It is suspected that her LV dysfunction is most likely nonischemic in nature. Continue Coreg and hydralazine. No ACE inhibitor/ARB due to acute renal failure.    Management plans discussed with the patient and she is in agreement.    CODE STATUS: FULL  TOTAL TIME TAKING CARE OF THIS PATIENT: 20 minutes.   Discussed with nephrology.  POSSIBLE D/C 5-7 days, DEPENDING ON CLINICAL CONDITION.   Jsiah Menta M.D on 10/15/2015 at 12:18 PM  Between 7am to 6pm - Pager - [phone] After 6pm go to [email or web address] - password [email or web address]  [facility]  Office  [phone]  CC: Primary care physician; Sherrie MustacheFayegh Jadali, MD  Note: This dictation was prepared with Dragon dictation along with smaller phrase technology. Any transcriptional errors that result from this process are unintentional.

## 2015-10-15 NOTE — ED Provider Notes (Signed)
[facility]  Department of Emergency Medicine   Code Blue CONSULT NOTE  Chief Complaint: Cardiac arrest/unresponsive   Level V Caveat: Unresponsive  History of present illness: I was contacted by the hospital for a CODE BLUE cardiac arrest upstairs and presented to the patient's bedside.  The patient, Karen Rich, is a 39 year old female who is admitted for congestive heart failure. She also has chronic renal disease. She called out from her room saying that she couldn't get comfortable. The nurses then saw her "brady down." The patient was found then to be unresponsive and without pulses.  ROS: Unable to obtain, Level V caveat  Scheduled Meds: . carvedilol  12.5 mg Oral BID WC  . collagenase   Topical Daily  . enoxaparin (LOVENOX) injection  30 mg Subcutaneous Q24H  . furosemide  80 mg Intravenous Q8H  . hydrALAZINE  37.5 mg Oral 3 times per day  . insulin aspart  0-5 Units Subcutaneous QHS  . insulin aspart  0-9 Units Subcutaneous TID WC  . insulin glargine  12 Units Subcutaneous Daily  . isosorbide dinitrate  10 mg Oral TID  . metolazone  5 mg Oral Daily  . sodium chloride  10-40 mL Intracatheter Q12H   Continuous Infusions:  PRN Meds:.sodium chloride, acetaminophen **OR** acetaminophen, hydrALAZINE, loperamide, ondansetron **OR** ondansetron (ZOFRAN) IV, sodium chloride, sodium chloride Past Medical History  Diagnosis Date  . Edema   . Hyperlipidemia   . Hyperglycemia   . Fatigue   . Irregular heart beat   . Hypertension   . Diabetes mellitus without complication (HCC)   . Renal disorder   . Renal failure     stage 3   Past Surgical History  Procedure Laterality Date  . None     Social History   Social History  . Marital Status: Divorced    Spouse Name: N/A  . Number of Children: N/A  . Years of Education: N/A   Occupational History  . Not on file.   Social History Main Topics  . Smoking status: Never Smoker   . Smokeless tobacco: Not on file   . Alcohol Use: No  . Drug Use: No  . Sexual Activity: Not on file   Other Topics Concern  . Not on file   Social History Narrative   No Known Allergies  Last set of Vital Signs (not current) Filed Vitals:   10/15/15 1900  BP: 196/96  Pulse: 88  Temp:   Resp:       Physical Exam  Gen: unresponsive Cardiovascular: pulseless  Resp: apneic. Breath sounds equal bilaterally with bagging  Abd: nondistended  Neuro: GCS 3, unresponsive to pain  HEENT: No blood in posterior pharynx, gag reflex absent  Neck: No crepitus  Musculoskeletal: No deformity  Skin: warm  Procedures  INTUBATION Performed by: [name],  [name] Required items: required blood products, implants, devices, and special equipment available Patient identity confirmed: provided demographic data and hospital-assigned identification number Time out: Immediately prior to procedure a "time out" was called to verify the correct patient, procedure, equipment, support staff and site/side marked as required. Indications: CODE BLUE  Intubation method: Direct laryngoscopy with a Mac 4 blade. Preoxygenation: BVM Sedatives: No sedatives needed because the patient was flaccid and unresponsive  Paralytic: No sedatives or paralytics needed because the patient was flaccid and unresponsive.  Tube Size: 7.5 cuffed Post-procedure assessment: chest rise and ETCO2 monitor Breath sounds: equal and absent over the epigastrium Tube secured by Respiratory Therapy Patient tolerated  the procedure well with no immediate complications.  CRITICAL CARE Performed by: [name] Total critical care time: 20 minutes Critical care time was exclusive of separately billable procedures and treating other patients. Critical care was necessary to treat or prevent imminent or life-threatening deterioration. Critical care was time spent personally by me on the following activities: development of treatment plan with patient and/or  surrogate as well as nursing, discussions with consultants, evaluation of patient's response to treatment, examination of patient, obtaining history from patient or surrogate, ordering and performing treatments and interventions, ordering and review of laboratory studies, ordering and review of radiographic studies, pulse oximetry and re-evaluation of patient's condition.  Cardiopulmonary Resuscitation (CPR) Procedure Note The patient was intubated, given 2 rounds of epinephrine, bicarbonate as well as calcium. Please see the code sheet for specific details in times. The patient was originally PA but then regained a pulse as well as a blood pressure of the 200s over 100s. She also had several agonal breaths after regaining pulses. Directed/Performed by: [name] I personally directed ancillary staff and/or performed CPR in an effort to regain return of spontaneous circulation and to maintain cardiac, neuro and systemic perfusion.     Medical Decision making  After pulses returned, Dr. Amado Coe, of the medicine service was present in the room. I discussed case Dr. Hoyle Sauer and transition care. The staff was in the process of getting an EKG as well as an Accu-Chek. The staff was also able to print out a telemetry monitor strip just before the patient coded and appears the patient went into V. fib. I discussed cooling with Dr. Amado Coe.  The patient will be transported to the ICU.   Assessment and Plan  Patient be transferred to the ICU.  V. fib arrest.    Myrna Blazer, MD 10/15/15 [phone]

## 2015-10-15 NOTE — Progress Notes (Signed)
[facility] - [place] at [facility]   PATIENT NAME: Karen Rich    MR#:  [id number]  DATE OF BIRTH:  1976/05/27  SUBJECTIVE:  Code blue was called as pt was c/o chest pain , gasping and became unresponsive . Patient was found to be bradycardic and eventually asystole-ACLS protocol was implemented for PEA by the ED physician. Once patient's rhythm was back she was intubated and moved to intensive care unit. Her rhythm strips have revealed ventricular fibrillation regarding which I  thought of initiating code ICE. But during my examination patient was able to move her eyes also moving her RUE to verbal commands. REVIEW OF SYSTEMS:    Review of Systems  Unable to perform ROS         DRUG ALLERGIES:  No Known Allergies  VITALS:  Blood pressure 196/96, pulse 88, temperature 97.7 F (36.5 C), temperature source Oral, resp. rate 21, height 5' 3.5" (1.613 m), weight 106.777 kg (235 lb 6.4 oz), last menstrual period 09/08/2015, SpO2 100 %.  PHYSICAL EXAMINATION:   Physical Exam  Constitutional: She is well-developed, well-nourished, and in no distress. No distress.  HENT:  Head: Normocephalic.  Eyes: No scleral icterus.  Neck: No JVD present. No tracheal deviation present.  Cardiovascular: Exam reveals no gallop and no friction rub.   No murmur heard. Pulmonary/Chest: She has rales.  Abdominal: There is no tenderness.  Musculoskeletal: She exhibits edema.  Patient has anasarca  Neurological:  Tracking me with her eyes and moving her right upper extremity with verbal commands   Skin: Skin is warm. No rash noted. No erythema.  Nursing note and vitals reviewed.     LABORATORY PANEL:   CBC  Recent Labs Lab 10/14/15 0258  WBC 5.8  HGB 8.5*  HCT 26.8*  PLT 322   ------------------------------------------------------------------------------------------------------------------  Chemistries   Recent Labs Lab 10/10/15 2016  10/15/15 0315   NA  --   < > 141  K  --   < > 4.3  CL  --   < > 115*  CO2  --   < > 22  GLUCOSE  --   < > 98  BUN  --   < > 50*  CREATININE 3.11*  < > 3.76*  CALCIUM 8.7*  < > 8.6*  MG 2.1  --   --   AST 30  --   --   ALT 37  --   --   ALKPHOS 117  --   --   BILITOT 0.5  --   --   < > = values in this interval not displayed. ------------------------------------------------------------------------------------------------------------------  Cardiac Enzymes  Recent Labs Lab 10/10/15 2016 10/11/15 0150 10/11/15 0725  TROPONINI 0.03 <0.03 <0.03   ------------------------------------------------------------------------------------------------------------------  RADIOLOGY:  Dg Chest Port 1 View  10/15/2015  CLINICAL DATA:  Status post intubation. EXAM: PORTABLE CHEST 1 VIEW COMPARISON:  Earlier film, same date. FINDINGS: External pacer paddles are noted. The right IJ center venous catheter tip is in the right atrium and could be retracted 2 cm. The endotracheal tube is right at the carina. Very low lung volumes with vascular crowding atelectasis. Stable cardiac enlargement and pulmonary edema. IMPRESSION: 1. The endotracheal tube is right at the carina and should be retracted approximately 2-3 cm. 2. Right IJ central venous catheter tip is in the right atrium. This could be retracted 2-3 cm. 3. Low lung volumes with vascular crowding, atelectasis and persistent mild interstitial edema. Electronically Signed   By: Demetrius Charity.  Gallerani M.D.   On: 10/15/2015 19:26   Dg Chest Port 1 View  10/15/2015  CLINICAL DATA:  Central line placement.  Readjustment. EXAM: PORTABLE CHEST 1 VIEW COMPARISON:  10/15/2015 FINDINGS: Right central line tip is near the cavoatrial junction. Decreasing lung volumes with low lung volumes increasing bilateral lower lobe opacities. Cardiomegaly. No pneumothorax. Mild vascular congestion. IMPRESSION: Retraction of the right central line with the tip now near the cavoatrial junction.  Cardiomegaly, vascular congestion. Decreasing lung volumes with bibasilar atelectasis or infiltrates. Electronically Signed   By: Charlett NoseKevin  Dover M.D.   On: 10/15/2015 14:46   Dg Chest Port 1 View  10/15/2015  CLINICAL DATA:  Central line placement EXAM: PORTABLE CHEST 1 VIEW COMPARISON:  10/10/2015 FINDINGS: Enlarged cardiac silhouette. Hazy attenuation in the perihilar region bilaterally suggesting the possibility of pulmonary edema. Right internal jugular central line identified with tip extending about 3 cm into the right atrium. No pneumothorax. IMPRESSION: No pneumothorax.  Central line extends into right atrium. Electronically Signed   By: Esperanza Heiraymond  Rubner M.D.   On: 10/15/2015 13:51     ASSESSMENT AND PLAN:   39 year old female with a history of type 2 diabetes, hyperlipidemia and chronic kidney disease stage III presents to the ER with ascites and acute on chronic renal failure. More her to intensive care unit after CODE BLUE with asystole and got intubated  1. Bradycardia leading to PEA ACLS protocol was intubated by the ED physician Patient is intubated Vent  management per ICU Stat call is placed to on-call pulmonologist and discussed with intensivist. Stat labs are ordered  2.? V. Fib Code ice was not called as the patient was spontaneously responding prior to sedation after intubation Patient was video monitored by the on-call pulmonologist Rhythm strip was sent over and the on-call cardiology Dr. Johney FrameAllred, he  is not convinced that it is V. fib and he thinks it is an artifact and did not have any further recommendation  3.  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  We will start the patient on on Lasix drip Call placed to on-call nephrologist   4 Type 2 diabetes: Continue sliding scale insulin. Continue Lantus 12 units at bedtime. A1c is 6.6    5. Acute renal failure on chronic kidney disease stage  II/3: Baseline creatinine  1.2.  6.. Cardiomyopathy with EF 40-45%: Appreciate cardiology consultation. Continue to monitor input and output. S      CODE STATUS: FULL  TOTAL CRITICAL CARE TIME TAKING CARE OF THIS PATIENT: 60 minutes.   Voicemail left to Harden MoStephanie Ent to call back regarding the patient's situation  POSSIBLE D/C 5-7 days, DEPENDING ON CLINICAL CONDITION.   Ramonita LabGouru, Juma Oxley M.D on 10/15/2015 at 7:37 PM  Between 7am to 6pm - Pager - [phone] After 6pm go to [email or web address] - password [email or web address]  [facility]  Office  [phone]  CC: Primary care physician; Sherrie MustacheFayegh Jadali, MD  Note: This dictation was prepared with Dragon dictation along with smaller phrase technology. Any transcriptional errors that result from this process are unintentional.

## 2015-10-15 NOTE — Progress Notes (Signed)
Patient yelled out staff in room to see what patient was yelling about. Patient stated i cant get comfortable and pointing at her neck. Telemetry checked hr was showing nsr 60-70. Patient then become less responsive fell back in bed, telemetry checked again heart brady down to 40's. Patient then become unresponsive, no pulse felt. CPR was started and code blue was called. Patient showing asystole on monitor. See code blue recording sheet for further information, only medications pushed no shocks administered. Patient was intubated during code, rhythm and pulse was regained. EKG was done in room and stat CXR was ordered. Patient then transferred to CCU room 18.

## 2015-10-15 NOTE — Care Management (Signed)
There are no room air 02 sats documented as requested during progression last week.  Lasix drip rate flow increased today.    Patient is in agreement with home health services at discharge and no agency choice.    [facility].  There is concern that this will be a prolonged hospitalization.  Attending asks about LTACH.    LTACH is not  an accepted level of care/ service payable by medicaid in .

## 2015-10-15 NOTE — Progress Notes (Signed)
eLink Physician-Brief Progress Note Patient Name: Karen Rich DOB: [birth year] MRN: [id number]   Date of Service  10/15/2015  HPI/Events of Note  Cardiac arrest from floor By report, Vfib, 7 minutes of CPR Now intubated, following commands Hemodynamically stable  eICU Interventions  Primary service to discuss with cardiology who is following Will write vent, sedation orders As she is following commands (I witnessed "raise your hand" per nursing) will not start induced hypothermia protocol     Intervention Category Evaluation Type: New Patient Evaluation  Karen Rich 10/15/2015, 7:31 PM

## 2015-10-15 NOTE — Progress Notes (Signed)
Patient: Karen Rich / Admit Date: 10/10/2015 / Date of Encounter: 10/15/2015, 11:35 AM   Subjective: Lost her IV today. She is for central line placement.   Review of Systems: ROS  Objective: Telemetry: NSR, 70's Physical Exam: Blood pressure 137/70, pulse 77, temperature 97.7 F (36.5 C), temperature source Oral, resp. rate 21, height 5' 3.5" (1.613 m), weight 235 lb 6.4 oz (106.777 kg), last menstrual period 09/08/2015, SpO2 95 %. Body mass index is 41.04 kg/(m^2). General: Well developed, well nourished, in no acute distress. Head: Normocephalic, atraumatic, sclera non-icteric, no xanthomas, nares are without discharge. Neck: Negative for carotid bruits. JVP not elevated. Lungs: Bilateral crackles. Breathing is unlabored. Heart: RRR S1 S2 without murmurs, rubs, or gallops.  Abdomen: Soft, non-tender, non-distended with normoactive bowel sounds. No rebound/guarding. Extremities: No clubbing or cyanosis. No edema. Distal pedal pulses are 2+ and equal bilaterally. Neuro: Alert and oriented X 3. Moves all extremities spontaneously. Psych:  Responds to questions appropriately with a normal affect.   Intake/Output Summary (Last 24 hours) at 10/15/15 1135 Last data filed at 10/15/15 1015  Gross per 24 hour  Intake 996.33 ml  Output   1500 ml  Net -503.67 ml    Inpatient Medications:  . carvedilol  12.5 mg Oral BID WC  . collagenase   Topical Daily  . enoxaparin (LOVENOX) injection  30 mg Subcutaneous Q24H  . furosemide  80 mg Intravenous Q8H  . hydrALAZINE  37.5 mg Oral 3 times per day  . insulin aspart  0-5 Units Subcutaneous QHS  . insulin aspart  0-9 Units Subcutaneous TID WC  . insulin glargine  12 Units Subcutaneous Daily  . isosorbide dinitrate  10 mg Oral TID  . metolazone  5 mg Oral Daily   Infusions:    Labs:  Recent Labs  10/14/15 0258 10/15/15 0315  NA 139 141  K 4.5 4.3  CL 112* 115*  CO2 20* 22  GLUCOSE 151* 98  BUN 47* 50*  CREATININE 3.55*  3.76*  CALCIUM 8.4* 8.6*   No results for input(s): AST, ALT, ALKPHOS, BILITOT, PROT, ALBUMIN in the last 72 hours.  Recent Labs  10/14/15 0258  WBC 5.8  HGB 8.5*  HCT 26.8*  MCV 88.2  PLT 322   No results for input(s): CKTOTAL, CKMB, TROPONINI in the last 72 hours. Invalid input(s): POCBNP No results for input(s): HGBA1C in the last 72 hours.   Weights: Filed Weights   10/13/15 1038 10/14/15 0359 10/15/15 0653  Weight: 245 lb 9.5 oz (111.4 kg) 244 lb 4.8 oz (110.814 kg) 235 lb 6.4 oz (106.777 kg)     Radiology/Studies:  Dg Chest 2 View  10/10/2015  CLINICAL DATA:  Short of breath and edema EXAM: CHEST  2 VIEW COMPARISON:  07/10/2013 FINDINGS: Lungs are severely under aerated. Small pleural effusions are noted on the lateral view. There is increased opacity towards the lung bases favored represent volume loss. Developing airspace disease is not excluded. Pulmonary vasculature is within normal limits. IMPRESSION: Low lung volumes. Small bilateral pleural effusions Bibasilar volume loss. Electronically Signed   By: Jolaine Click M.D.   On: 10/10/2015 15:46   US Renal  10/10/2015  CLINICAL DATA:  Acute renal failure. EXAM: RENAL / URINARY TRACT ULTRASOUND COMPLETE COMPARISON:  07/10/2013 chest CT. FINDINGS: Right Kidney: Length: 9.3 cm. Echogenic right kidney with no right hydronephrosis and no right renal mass. No right renal cortical atrophy. Left Kidney: Length: 10.7 cm. Echogenic left kidney with no  left hydronephrosis and no left renal mass. No left renal cortical atrophy. Incidentally noted is a small left pleural effusion. Bladder: Appears normal for degree of bladder distention. IMPRESSION: 1. No hydronephrosis. 2. Echogenic normal size kidneys, indicating nonspecific renal parenchymal disease of uncertain chronicity. 3. Incidental small left pleural effusion. 4. Normal bladder. Electronically Signed   By: Delbert PhenixJason A Poff M.D.   On: 10/10/2015 18:59     Assessment and Plan   1.  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 Acute on CKD stage IV: -No indication for dialysis at this time per Renal -With proteinuria (nephrotic range 4.9 grams), felt to be 2/2 DM, possibly in the setting of poorly controlled CHF and HTN as well -Renally dose medications -Avoid nephrotoxins  3. Uncontrolled diabetes, type 2: -Per IM  4. HTN with associated renal failure and heart failure, uncontrolled: -Continue Coreg, isosorbide dinitrate, hydralazine, and diuretics     Elinor DodgeSigned, Lealand Elting, PA-C Pager: [phone] 10/15/2015, 11:35 AM

## 2015-10-16 ENCOUNTER — Inpatient Hospital Stay: Payer: Medicaid Other

## 2015-10-16 DIAGNOSIS — I5043 Acute on chronic combined systolic (congestive) and diastolic (congestive) heart failure: Secondary | ICD-10-CM

## 2015-10-16 DIAGNOSIS — J9601 Acute respiratory failure with hypoxia: Secondary | ICD-10-CM

## 2015-10-16 DIAGNOSIS — N179 Acute kidney failure, unspecified: Secondary | ICD-10-CM

## 2015-10-16 DIAGNOSIS — I469 Cardiac arrest, cause unspecified: Secondary | ICD-10-CM

## 2015-10-16 DIAGNOSIS — J9690 Respiratory failure, unspecified, unspecified whether with hypoxia or hypercapnia: Secondary | ICD-10-CM

## 2015-10-16 DIAGNOSIS — N189 Chronic kidney disease, unspecified: Secondary | ICD-10-CM

## 2015-10-16 DIAGNOSIS — E1059 Type 1 diabetes mellitus with other circulatory complications: Secondary | ICD-10-CM

## 2015-10-16 DIAGNOSIS — I509 Heart failure, unspecified: Secondary | ICD-10-CM | POA: Insufficient documentation

## 2015-10-16 LAB — BASIC METABOLIC PANEL
Anion gap: 6 (ref 5–15)
BUN: 52 mg/dL — AB (ref 6–20)
CO2: 23 mmol/L (ref 22–32)
CREATININE: 3.8 mg/dL — AB (ref 0.44–1.00)
Calcium: 8.9 mg/dL (ref 8.9–10.3)
Chloride: 110 mmol/L (ref 101–111)
GFR calc Af Amer: 16 mL/min — ABNORMAL LOW (ref >60)
GFR, EST NON AFRICAN AMERICAN: 14 mL/min — AB (ref >60)
GLUCOSE: 134 mg/dL — AB (ref 65–99)
POTASSIUM: 4.1 mmol/L (ref 3.5–5.1)
SODIUM: 139 mmol/L (ref 135–145)

## 2015-10-16 LAB — BLOOD GAS, ARTERIAL
ALLENS TEST (PASS/FAIL): POSITIVE — AB
Acid-base deficit: 3.4 mmol/L — ABNORMAL HIGH (ref 0.0–2.0)
Bicarbonate: 21.2 mEq/L (ref 21.0–28.0)
FIO2: 40
MECHVT: 500 mL
O2 Saturation: 99.3 %
PATIENT TEMPERATURE: 37
PEEP: 5 cmH2O
PO2 ART: 152 mmHg — AB (ref 83.0–108.0)
RATE: 14 resp/min
pCO2 arterial: 35 mmHg (ref 32.0–48.0)
pH, Arterial: 7.39 (ref 7.350–7.450)

## 2015-10-16 LAB — RENAL FUNCTION PANEL
ALBUMIN: 1.9 g/dL — AB (ref 3.5–5.0)
Anion gap: 6 (ref 5–15)
BUN: 52 mg/dL — AB (ref 6–20)
CO2: 24 mmol/L (ref 22–32)
CREATININE: 3.82 mg/dL — AB (ref 0.44–1.00)
Calcium: 8.8 mg/dL — ABNORMAL LOW (ref 8.9–10.3)
Chloride: 110 mmol/L (ref 101–111)
GFR calc Af Amer: 16 mL/min — ABNORMAL LOW (ref >60)
GFR, EST NON AFRICAN AMERICAN: 14 mL/min — AB (ref >60)
GLUCOSE: 132 mg/dL — AB (ref 65–99)
PHOSPHORUS: 5.2 mg/dL — AB (ref 2.5–4.6)
POTASSIUM: 4.1 mmol/L (ref 3.5–5.1)
SODIUM: 140 mmol/L (ref 135–145)

## 2015-10-16 LAB — GLUCOSE, CAPILLARY
GLUCOSE-CAPILLARY: 131 mg/dL — AB (ref 65–99)
GLUCOSE-CAPILLARY: 128 mg/dL — AB (ref 65–99)
GLUCOSE-CAPILLARY: 66 mg/dL (ref 65–99)
GLUCOSE-CAPILLARY: 72 mg/dL (ref 65–99)
Glucose-Capillary: 121 mg/dL — ABNORMAL HIGH (ref 65–99)
Glucose-Capillary: 103 mg/dL — ABNORMAL HIGH (ref 65–99)

## 2015-10-16 LAB — CBC
HEMATOCRIT: 25.2 % — AB (ref 35.0–47.0)
Hemoglobin: 7.9 g/dL — ABNORMAL LOW (ref 12.0–16.0)
MCH: 27.4 pg (ref 26.0–34.0)
MCHC: 31.2 g/dL — AB (ref 32.0–36.0)
MCV: 87.7 fL (ref 80.0–100.0)
Platelets: 298 10*3/uL (ref 150–440)
RBC: 2.88 MIL/uL — ABNORMAL LOW (ref 3.80–5.20)
RDW: 16.4 % — AB (ref 11.5–14.5)
WBC: 4.7 10*3/uL (ref 3.6–11.0)

## 2015-10-16 MED ORDER — CHLORHEXIDINE GLUCONATE 0.12% ORAL RINSE (MEDLINE KIT)
15.0000 mL | Freq: Two times a day (BID) | OROMUCOSAL | Status: DC
  Administered 2015-10-16 – 2015-10-18 (×6): 15 mL via OROMUCOSAL
  Filled 2015-10-16 (×9): qty 15

## 2015-10-16 MED ORDER — ANTISEPTIC ORAL RINSE SOLUTION (CORINZ)
7.0000 mL | Freq: Four times a day (QID) | OROMUCOSAL | Status: DC
  Administered 2015-10-16 – 2015-10-19 (×13): 7 mL via OROMUCOSAL
  Filled 2015-10-16 (×17): qty 7

## 2015-10-16 MED ORDER — FUROSEMIDE 10 MG/ML IJ SOLN
80.0000 mg | Freq: Four times a day (QID) | INTRAMUSCULAR | Status: DC
  Administered 2015-10-16 – 2015-10-19 (×12): 80 mg via INTRAVENOUS
  Filled 2015-10-16 (×12): qty 8

## 2015-10-16 MED ORDER — DEXTROSE 50 % IV SOLN
INTRAVENOUS | Status: AC
  Administered 2015-10-16: 22:00:00
  Filled 2015-10-16: qty 50

## 2015-10-16 MED ORDER — DEXTROSE 50 % IV SOLN
25.0000 mL | INTRAVENOUS | Status: AC
  Administered 2015-10-16: 25 mL via INTRAVENOUS

## 2015-10-16 MED ORDER — ENOXAPARIN SODIUM 40 MG/0.4ML ~~LOC~~ SOLN
40.0000 mg | SUBCUTANEOUS | Status: DC
  Administered 2015-10-16 – 2015-10-19 (×4): 40 mg via SUBCUTANEOUS
  Filled 2015-10-16 (×4): qty 0.4

## 2015-10-16 NOTE — Progress Notes (Signed)
Called by treatment team after cardiac arrest.  By report sounds to have been initially a PEA arrest for which CPR and intubation were required.   There was concern that the patient may have had "VF" on monitor.  The patient did not require defibrillation per report.  I have reviewed strips sent to my phone of the rhythm.  The patient had sinus bradycardia/ junctional rhythm (Likely in setting of PEA) with subsequent lead ARTIFACT.  I do NOT see any evidence of VF on tracings that I have reviewed.  No additional EP workup recommended at this time.  General cardiology team to follow-up in AM.  Hillis RangeJames Eleonore Shippee MD, [facility] 10/15/15  9pm

## 2015-10-16 NOTE — Progress Notes (Signed)
ANTICOAGULATION CONSULT NOTE   Pharmacy Consult for automatically consulted for enoxaparin dosing Indication: VTE prophylaxis  No Known Allergies  Patient Measurements: Height: 5' 3.5" (161.3 cm) Weight: 295 lb 10.2 oz (134.1 kg) IBW/kg (Calculated) : 53.55   Vital Signs: Temp: 98 F (36.7 C) (11/08 1100) Temp Source: Oral (11/08 1100) BP: 128/60 mmHg (11/08 1100) Pulse Rate: 72 (11/08 1100)  Labs:  Recent Labs  10/14/15 0258 10/15/15 0315 10/15/15 2002 10/16/15 0948  HGB 8.5*  --  8.6* 7.9*  HCT 26.8*  --  25.4* 25.2*  PLT 322  --  287 298  CREATININE 3.55* 3.76* 3.63* 3.82*  3.80*    Estimated Creatinine Clearance: 26.3 mL/min (by C-G formula based on Cr of 3.82).  Assessment:  39 yo female needing DVT prophylaxis with enoxaparin. Patients CrCl 26.4 ml/min and  Scr 3.8  BMI:50   Plan:  Due to patients CrCl <30 but BMI >40 will dose enoxaparin  40mg  every 24 hours per [facility] policy.   Cher NakaiSheema Maragret Vanacker, PharmD Pharmacy Resident

## 2015-10-16 NOTE — Progress Notes (Signed)
Inpatient Diabetes Program Recommendations  AACE/ADA: New Consensus Statement on Inpatient Glycemic Control (2015)  Target Ranges:  Prepandial:   less than 140 mg/dL      Peak postprandial:   less than 180 mg/dL (1-2 hours)      Critically ill patients:  140 - 180 mg/dL    Results for Karen Rich, Karen Rich (MRN [id number]) as of 10/16/2015 09:40  Ref. Range 10/15/2015 07:32 10/15/2015 11:12 10/15/2015 16:04 10/15/2015 19:12 10/15/2015 21:55  Glucose-Capillary Latest Ref Range: 65-99 mg/dL 102112 (H) 725151 (H) 366238 (H) 214 (H) 207 (H)    Admit with: Anasarca  History: DM, HTN, CKD  Home DM Meds: None (stopped insulin months ago per H&P note)  Current Insulin Orders: Lantus 12 units daily      Novolog Sensitive SSI (0-9 units) TID AC + HS    -Note patient suffered PEA cardiac arrest last PM.  Was Intubated and transferred to ICU.  -Currently NPO.    MD- Please change Novolog Sensitive SSI (0-9 units) to Q4 hour coverage while patient is Intubated and NPO (currently ordered as TID AC + HS)     --Will follow patient during hospitalization--  Ambrose FinlandJeannine Johnston Feliciano Wynter RN, MSN, CDE Diabetes Coordinator Inpatient Glycemic Control Team Team Pager: [phone] (8a-5p)

## 2015-10-16 NOTE — Progress Notes (Addendum)
Pt arrived to the floor accompanied by the team that ran the code. Pt was intubated upon arrival, able to make eye contact and lift her right arm on command. Pt became restless and fighting vent, sedation ordered. Propofol inititated, pt has copious secretions, suctioned mouth. Central line dressing is wet and completely off of the IJ site upon arrival.  Called Dr. Kendrick FriesMcQuaid because CXR stated the IJ needed to be pulled back 2 cm. He reviewed the CXR and stated it was OK to use the IV as is and no need to get [facility] to adjust placement.

## 2015-10-16 NOTE — Consult Note (Signed)
PULMONARY / CRITICAL CARE MEDICINE   Name: Karen Rich MRN: [id number] DOB: 14-Apr-1976    ADMISSION DATE:  10/10/2015 CONSULTATION DATE:  10/16/15  REFERRING MD : Dr. Amado Coe   CHIEF COMPLAINT:     Cardiac arrest, respiratory failure   HISTORY OF PRESENT ILLNESS - per chart review   Patient admitted on 10/10/15 with the below history: 39 y.o. female with a known history of diabetes type 2, hyperlipidemia, hypertension, chronic kidney disease stage III who presents to the ED with complaint of swelling of her body diffusely. She reports that she stop taking all her medication including her insulin a few months ago. She also reports that she's been having shortness of breath ongoing for the past few months and is unable to lay flat and has to sleep in a chair. She denies any chest pain fevers or chills. Denies any nausea vomiting or diarrhea  On 10/15/15 the following events occurred Code blue was called as pt was c/o chest pain , gasping and became unresponsive . Patient was found to be bradycardic and eventually asystole-ACLS protocol was implemented for PEA by the ED physician. Once patient's rhythm was back she was intubated and moved to intensive care unit. Her rhythm strips were suggestive of ventricular fibrillation, however patient was able to move her eyes also moving her RUE to verbal commands, and hypothermia protocol was not initiated.    SIGNIFICANT EVENTS    11/7>> CODE BLUE, V. Tach> bradycardia> asystole, 2 rounds of ACLS and intubated 11/7>> RIJ CVL   PAST MEDICAL HISTORY    :  Past Medical History  Diagnosis Date  . Edema   . Hyperlipidemia   . Hyperglycemia   . Fatigue   . Irregular heart beat   . Hypertension   . Diabetes mellitus without complication (HCC)   . Renal disorder   . Renal failure     stage 3   Past Surgical History  Procedure Laterality Date  . None     Prior to Admission medications   Not on File   No Known Allergies   FAMILY  HISTORY   Family History  Problem Relation Age of Onset  . Hypertension Mother   . Diabetes Mother   . Hypertension Father   . CVA Father       SOCIAL HISTORY    reports that she has never smoked. She does not have any smokeless tobacco history on file. She reports that she does not drink alcohol or use illicit drugs.  Review of Systems  Unable to perform ROS: intubated      VITAL SIGNS    Temp:  [97.7 F (36.5 C)-98.5 F (36.9 C)] 97.8 F (36.6 C) (11/08 0737) Pulse Rate:  [69-95] 71 (11/08 1000) Resp:  [0-21] 4 (11/08 1000) BP: (106-241)/(55-105) 106/55 mmHg (11/08 1000) SpO2:  [95 %-100 %] 100 % (11/08 1000) FiO2 (%):  [40 %] 40 % (11/08 0818) Weight:  [295 lb 10.2 oz (134.1 kg)] 295 lb 10.2 oz (134.1 kg) (11/08 0425) HEMODYNAMICS:   VENTILATOR SETTINGS: Vent Mode:  [-] PRVC FiO2 (%):  [40 %] 40 % Set Rate:  [14 bmp] 14 bmp Vt Set:  [500 mL] 500 mL PEEP:  [5 cmH20] 5 cmH20 INTAKE / OUTPUT:  Intake/Output Summary (Last 24 hours) at 10/16/15 1028 Last data filed at 10/16/15 1024  Gross per 24 hour  Intake 804.18 ml  Output   2230 ml  Net -1425.82 ml       PHYSICAL EXAM  Physical Exam  Constitutional: She appears well-developed.  HENT:  Head: Normocephalic.  Right Ear: External ear normal.  Left Ear: External ear normal.  Neck: Neck supple. No thyromegaly present.  Cardiovascular: Normal rate, regular rhythm, normal heart sounds and intact distal pulses.   No murmur heard. Pulmonary/Chest: She has no wheezes.  Intubated on ventilator, mild coarse upper airway sounds  Abdominal: Soft. Bowel sounds are normal. She exhibits no distension.  Musculoskeletal:  Edema to mid legs Generalized anasarca  Neurological:  Intubated and sedated  Skin: Skin is warm and dry.       LABS   LABS:  CBC  Recent Labs Lab 10/14/15 0258 10/15/15 2002 10/16/15 0948  WBC 5.8 7.0 4.7  HGB 8.5* 8.6* 7.9*  HCT 26.8* 25.4* 25.2*  PLT 322 287 298    Coag's No results for input(s): APTT, INR in the last 168 hours. BMET  Recent Labs Lab 10/14/15 0258 10/15/15 0315 10/15/15 2002  NA 139 141 135  K 4.5 4.3 4.5  CL 112* 115* 106  CO2 20* 22 22  BUN 47* 50* 50*  CREATININE 3.55* 3.76* 3.63*  GLUCOSE 151* 98 248*   Electrolytes  Recent Labs Lab 10/10/15 2016  10/14/15 0258 10/15/15 0315 10/15/15 2002  CALCIUM 8.7*  < > 8.4* 8.6* 8.8*  MG 2.1  --   --   --  2.1  < > = values in this interval not displayed. Sepsis Markers No results for input(s): LATICACIDVEN, PROCALCITON, O2SATVEN in the last 168 hours. ABG  Recent Labs Lab 10/15/15 2146  PHART 7.30*  PCO2ART 43  PO2ART 85   Liver Enzymes  Recent Labs Lab 10/10/15 2016 10/15/15 2002  AST 30 31  ALT 37 31  ALKPHOS 117 102  BILITOT 0.5 0.4  ALBUMIN 2.2* 2.0*   Cardiac Enzymes  Recent Labs Lab 10/10/15 2016 10/11/15 0150 10/11/15 0725  TROPONINI 0.03 <0.03 <0.03   Glucose  Recent Labs Lab 10/15/15 1112 10/15/15 1604 10/15/15 1912 10/15/15 2155 10/16/15 0729 10/16/15 0941  GLUCAP 151* 238* 214* 207* 131* 121*     Recent Results (from the past 240 hour(s))  C difficile quick scan w PCR reflex     Status: None   Collection Time: 10/10/15 10:21 PM  Result Value Ref Range Status   C Diff antigen NEGATIVE NEGATIVE Final   C Diff toxin NEGATIVE NEGATIVE Final   C Diff interpretation Negative for C. difficile  Final  C difficile quick scan w PCR reflex     Status: None   Collection Time: 10/12/15 10:35 AM  Result Value Ref Range Status   C Diff antigen NEGATIVE NEGATIVE Final   C Diff toxin NEGATIVE NEGATIVE Final   C Diff interpretation Negative for C. difficile  Final     Current facility-administered medications:  .  0.9 %  sodium chloride infusion, 250 mL, Intravenous, PRN, Auburn Bilberry, MD .  acetaminophen (TYLENOL) tablet 650 mg, 650 mg, Oral, Q6H PRN **OR** acetaminophen (TYLENOL) suppository 650 mg, 650 mg, Rectal, Q6H PRN,  Auburn Bilberry, MD .  albuterol (PROVENTIL) (2.5 MG/3ML) 0.083% nebulizer solution 2.5 mg, 2.5 mg, Nebulization, Q2H PRN, Lupita Leash, MD .  antiseptic oral rinse solution (CORINZ), 7 mL, Mouth Rinse, QID, Terica Yogi, MD, 7 mL at 10/16/15 0400 .  carvedilol (COREG) tablet 12.5 mg, 12.5 mg, Oral, BID WC, Auburn Bilberry, MD, 12.5 mg at 10/16/15 0834 .  chlorhexidine gluconate (PERIDEX) 0.12 % solution 15 mL, 15 mL, Mouth Rinse, BID, Tanaysha Alkins  Adair Lauderback, MD, 15 mL at 10/16/15 1002 .  collagenase (SANTYL) ointment, , Topical, Daily, Sital Mody, MD .  enoxaparin (LOVENOX) injection 30 mg, 30 mg, Subcutaneous, Q24H, Sital Mody, MD, 30 mg at 10/15/15 2149 .  famotidine (PEPCID) IVPB 20 mg premix, 20 mg, Intravenous, Q12H, Lupita Leash, MD, 20 mg at 10/16/15 0942 .  fentaNYL (SUBLIMAZE) injection 100 mcg, 100 mcg, Intravenous, Q15 min PRN, Lupita Leash, MD .  fentaNYL (SUBLIMAZE) injection 100 mcg, 100 mcg, Intravenous, Q2H PRN, Lupita Leash, MD .  furosemide (LASIX) injection 80 mg, 80 mg, Intravenous, Q6H, Lamont Dowdy, MD, 80 mg at 10/16/15 1016 .  hydrALAZINE (APRESOLINE) injection 10 mg, 10 mg, Intravenous, Q6H PRN, Auburn Bilberry, MD .  hydrALAZINE (APRESOLINE) tablet 37.5 mg, 37.5 mg, Oral, 3 times per day, Tonny Bollman, MD, 37.5 mg at 10/15/15 1433 .  insulin aspart (novoLOG) injection 0-5 Units, 0-5 Units, Subcutaneous, QHS, Adrian Saran, MD, 2 Units at 10/15/15 2158 .  insulin aspart (novoLOG) injection 0-9 Units, 0-9 Units, Subcutaneous, TID WC, Auburn Bilberry, MD, 1 Units at 10/16/15 0835 .  insulin glargine (LANTUS) injection 12 Units, 12 Units, Subcutaneous, Daily, Auburn Bilberry, MD, 12 Units at 10/14/15 0907 .  isosorbide dinitrate (ISORDIL) tablet 10 mg, 10 mg, Oral, TID, Tonny Bollman, MD, 10 mg at 10/16/15 0948 .  loperamide (IMODIUM) capsule 2 mg, 2 mg, Oral, Q6H PRN, Adrian Saran, MD, 2 mg at 10/15/15 1035 .  ondansetron (ZOFRAN) tablet 4 mg, 4 mg, Oral, Q6H PRN  **OR** ondansetron (ZOFRAN) injection 4 mg, 4 mg, Intravenous, Q6H PRN, Auburn Bilberry, MD .  propofol (DIPRIVAN) 1000 MG/100ML infusion, 0-50 mcg/kg/min, Intravenous, Continuous, Lupita Leash, MD, Last Rate: 22.4 mL/hr at 10/16/15 0838, 34.956 mcg/kg/min at 10/16/15 0838 .  sodium chloride 0.9 % injection 10-40 mL, 10-40 mL, Intracatheter, Q12H, Sital Mody, MD, 10 mL at 10/16/15 1017 .  sodium chloride 0.9 % injection 10-40 mL, 10-40 mL, Intracatheter, PRN, Adrian Saran, MD .  sodium chloride 0.9 % injection 3 mL, 3 mL, Intravenous, PRN, Auburn Bilberry, MD, 3 mL at 10/11/15 0523  IMAGING    Dg Chest Port 1 View  10/15/2015  CLINICAL DATA:  Status post intubation. EXAM: PORTABLE CHEST 1 VIEW COMPARISON:  Earlier film, same date. FINDINGS: External pacer paddles are noted. The right IJ center venous catheter tip is in the right atrium and could be retracted 2 cm. The endotracheal tube is right at the carina. Very low lung volumes with vascular crowding atelectasis. Stable cardiac enlargement and pulmonary edema. IMPRESSION: 1. The endotracheal tube is right at the carina and should be retracted approximately 2-3 cm. 2. Right IJ central venous catheter tip is in the right atrium. This could be retracted 2-3 cm. 3. Low lung volumes with vascular crowding, atelectasis and persistent mild interstitial edema. Electronically Signed   By: Rudie Meyer M.D.   On: 10/15/2015 19:26   Dg Chest Port 1 View  10/15/2015  CLINICAL DATA:  Central line placement.  Readjustment. EXAM: PORTABLE CHEST 1 VIEW COMPARISON:  10/15/2015 FINDINGS: Right central line tip is near the cavoatrial junction. Decreasing lung volumes with low lung volumes increasing bilateral lower lobe opacities. Cardiomegaly. No pneumothorax. Mild vascular congestion. IMPRESSION: Retraction of the right central line with the tip now near the cavoatrial junction. Cardiomegaly, vascular congestion. Decreasing lung volumes with bibasilar atelectasis  or infiltrates. Electronically Signed   By: Charlett Nose M.D.   On: 10/15/2015 14:46   Dg Chest [facility]  10/15/2015  CLINICAL DATA:  Central line placement EXAM: PORTABLE CHEST 1 VIEW COMPARISON:  10/10/2015 FINDINGS: Enlarged cardiac silhouette. Hazy attenuation in the perihilar region bilaterally suggesting the possibility of pulmonary edema. Right internal jugular central line identified with tip extending about 3 cm into the right atrium. No pneumothorax. IMPRESSION: No pneumothorax.  Central line extends into right atrium. Electronically Signed   By: Esperanza Heiraymond  Rubner M.D.   On: 10/15/2015 13:51      Indwelling Urinary Catheter continued, requirement due to   Reason to continue Indwelling Urinary Catheter for strict Intake/Output monitoring for hemodynamic instability   Central Line continued, requirement due to   Reason to continue [employer] Line Monitoring of central venous pressure or other hemodynamic parameters   Ventilator continued, requirement due to, resp failure    Ventilator Sedation RASS 0 to -2   Cultures: BCx2  UC  Sputum  Antibiotics:  Lines: RIJ CVL 11/7>>  ASSESSMENT/PLAN  39 year old female admitted for anasarca, CKD stage II, had a cardiac arrest on the medical floor, transferred to the medical ICU after 2 rounds of ACLS, intubated and sedated.  PULMONARY Respiratory failure Cardiac arrest Requirement for mechanical ventilation New onset CHF (combined systolic and diastolic) P:   -Continue mechanical ventilation, wean as tolerated, ABGs as needed -Maintain O2 sat greater than 88% -Respiratory failure complicated by this new onset CHF -Planning for diuresis -Monitor creatinine levels during diuresis  CARDIOVASCULAR CVL RIJ A:  PEA arrest Combined CHF Anasarca Hypertension P:  -Now with stable cardiac rhythm, being evaluated by cardiology -Her EF is 40-45 percent -Cardiology actively following, has combined systolic and diastolic CHF,  currently being treated with somewhat aggressive diuresis -Will require further outpatient cardiology workup  RENAL History of chronic kidney disease Elevated creatinine P:   -Monitor creatinine levels, continue with diuresis as tolerated -Appreciate nephrology recommendations -ICU electrolyte replacement protocol  GASTROINTESTINAL -GI prophylaxis  HEMATOLOGIC A:  Anemia P:  -Monitor blood counts, could be dilutional   ENDOCRINE Type 2 diabetes -Continue with SSI  NEUROLOGIC A:  Intubated and sedated P:   RASS goal: 0 -Had a short sedation vacation, was able to follow commands, but became increasingly agitated and this increased on the vent. Will continue with vent weaning protocol and sedation vacation    I have personally obtained a history, examined the patient, evaluated laboratory and imaging results, formulated the assessment and plan and placed orders.  The Patient requires high complexity decision making for assessment and support, frequent evaluation and titration of therapies, application of advanced monitoring technologies and extensive interpretation of multiple databases. Critical Care Time devoted to patient care services described in this note is 55 minutes.   Overall, patient is critically ill, prognosis is guarded. Patient at high risk for cardiac arrest and death.   Stephanie AcreVishal Cadance Raus, MD [facility] Pager [phone] (please enter 7-digits) On Call Pager - [phone] (please enter 7-digits)     10/16/2015, 10:28 AM  Note: This note was prepared with Dragon dictation along with smaller phrase technology. Any transcriptional errors that result from this process are unintentional.

## 2015-10-16 NOTE — Progress Notes (Signed)
Subjective:   Code blue called last night. PEA with bradycardia/junctional rhythm. Cardiology does not think this was ventricular fib with arrest.  Patient intubated, sedated, transferred to ICU. No on PRVC FiO 40% Furosemide gtt stopped yesterday. Received IV furosemide  once at 1516 with UOP of in last 24 hours.  Creatinine 3.63 (3.76). Albumin 2.   Critically ill. No family at bedside.   Objective:  Vital signs in last 24 hours:  Temp:  [97.7 F (36.5 C)-98.5 F (36.9 C)] 97.8 F (36.6 C) (11/08 0737) Pulse Rate:  [69-95] 70 (11/08 0800) Resp:  [0-21] 0 (11/08 0800) BP: (114-241)/(57-105) 123/57 mmHg (11/08 0800) SpO2:  [95 %-100 %] 100 % (11/08 0800) FiO2 (%):  [40 %] 40 % (11/08 0818) Weight:  [134.1 kg (295 lb 10.2 oz)] 134.1 kg (295 lb 10.2 oz) (11/08 0425)  Weight change: 27.323 kg (60 lb 3.8 oz) Filed Weights   10/14/15 0359 10/15/15 0653 10/16/15 0425  Weight: 110.814 kg (244 lb 4.8 oz) 106.777 kg (235 lb 6.4 oz) 134.1 kg (295 lb 10.2 oz)    Intake/Output:    Intake/Output Summary (Last 24 hours) at 10/16/15 0916 Last data filed at 10/16/15 0739  Gross per 24 hour  Intake 726.58 ml  Output   1705 ml  Net -978.42 ml     Physical Exam: General: Critically ill, +anasarca  HEENT ETT  Neck Right central line  Pulm/lungs Bilateral crackles diffuse  CVS/Heart Regular rhythm  Abdomen:  +distended, +abdominal wall edema  Extremities: 3+ pitting edema all four extremities  Neurologic: Alert, oriented, ambulatory  Skin: excoriations          Basic Metabolic Panel:   Recent Labs Lab 10/10/15 2016 10/12/15 0602 10/13/15 0325 10/14/15 0258 10/15/15 0315 10/15/15 2002  NA  --  138 137 139 141 135  K  --  4.4 4.7 4.5 4.3 4.5  CL  --  113* 112* 112* 115* 106  CO2  --  20* 20* 20* 22 22  GLUCOSE  --  107* 235* 151* 98 248*  BUN  --  38* 42* 47* 50* 50*  CREATININE 3.11* 3.44* 3.58* 3.55* 3.76* 3.63*  CALCIUM 8.7* 8.4* 8.3* 8.4* 8.6* 8.8*   MG 2.1  --   --   --   --  2.1     CBC:  Recent Labs Lab 10/10/15 1446 10/10/15 2016 10/14/15 0258 10/15/15 2002  WBC 6.4 6.5 5.8 7.0  NEUTROABS 4.9  --   --   --   HGB 9.8* 9.8* 8.5* 8.6*  HCT 31.0* 31.1* 26.8* 25.4*  MCV 88.1 88.1 88.2 88.6  PLT 410 425 322 287      Microbiology:  Recent Results (from the past 720 hour(s))  C difficile quick scan w PCR reflex     Status: None   Collection Time: 10/10/15 10:21 PM  Result Value Ref Range Status   C Diff antigen NEGATIVE NEGATIVE Final   C Diff toxin NEGATIVE NEGATIVE Final   C Diff interpretation Negative for C. difficile  Final  C difficile quick scan w PCR reflex     Status: None   Collection Time: 10/12/15 10:35 AM  Result Value Ref Range Status   C Diff antigen NEGATIVE NEGATIVE Final   C Diff toxin NEGATIVE NEGATIVE Final   C Diff interpretation Negative for C. difficile  Final    Coagulation Studies: No results for input(s): LABPROT, INR in the last 72 hours.  Urinalysis: No results for input(s):  COLORURINE, LABSPEC, PHURINE, GLUCOSEU, HGBUR, BILIRUBINUR, KETONESUR, PROTEINUR, UROBILINOGEN, NITRITE, LEUKOCYTESUR in the last 72 hours.  Invalid input(s): APPERANCEUR    Imaging: Dg Chest Port 1 View  10/15/2015  CLINICAL DATA:  Status post intubation. EXAM: PORTABLE CHEST 1 VIEW COMPARISON:  Earlier film, same date. FINDINGS: External pacer paddles are noted. The right IJ center venous catheter tip is in the right atrium and could be retracted 2 cm. The endotracheal tube is right at the carina. Very low lung volumes with vascular crowding atelectasis. Stable cardiac enlargement and pulmonary edema. IMPRESSION: 1. The endotracheal tube is right at the carina and should be retracted approximately 2-3 cm. 2. Right IJ central venous catheter tip is in the right atrium. This could be retracted 2-3 cm. 3. Low lung volumes with vascular crowding, atelectasis and persistent mild interstitial edema. Electronically Signed    By: Rudie MeyerP.  Gallerani M.D.   On: 10/15/2015 19:26   Dg Chest Port 1 View  10/15/2015  CLINICAL DATA:  Central line placement.  Readjustment. EXAM: PORTABLE CHEST 1 VIEW COMPARISON:  10/15/2015 FINDINGS: Right central line tip is near the cavoatrial junction. Decreasing lung volumes with low lung volumes increasing bilateral lower lobe opacities. Cardiomegaly. No pneumothorax. Mild vascular congestion. IMPRESSION: Retraction of the right central line with the tip now near the cavoatrial junction. Cardiomegaly, vascular congestion. Decreasing lung volumes with bibasilar atelectasis or infiltrates. Electronically Signed   By: Charlett NoseKevin  Dover M.D.   On: 10/15/2015 14:46   Dg Chest Port 1 View  10/15/2015  CLINICAL DATA:  Central line placement EXAM: PORTABLE CHEST 1 VIEW COMPARISON:  10/10/2015 FINDINGS: Enlarged cardiac silhouette. Hazy attenuation in the perihilar region bilaterally suggesting the possibility of pulmonary edema. Right internal jugular central line identified with tip extending about 3 cm into the right atrium. No pneumothorax. IMPRESSION: No pneumothorax.  Central line extends into right atrium. Electronically Signed   By: Esperanza Heiraymond  Rubner M.D.   On: 10/15/2015 13:51     Medications:   . propofol (DIPRIVAN) infusion 34.956 mcg/kg/min (10/16/15 0838)   . antiseptic oral rinse  7 mL Mouth Rinse QID  . carvedilol  12.5 mg Oral BID WC  . chlorhexidine gluconate  15 mL Mouth Rinse BID  . collagenase   Topical Daily  . enoxaparin (LOVENOX) injection  30 mg Subcutaneous Q24H  . famotidine (PEPCID) IV  20 mg Intravenous Q12H  . hydrALAZINE  37.5 mg Oral 3 times per day  . insulin aspart  0-5 Units Subcutaneous QHS  . insulin aspart  0-9 Units Subcutaneous TID WC  . insulin glargine  12 Units Subcutaneous Daily  . isosorbide dinitrate  10 mg Oral TID  . metolazone  5 mg Oral Daily  . sodium chloride  10-40 mL Intracatheter Q12H   sodium chloride, acetaminophen **OR** acetaminophen,  albuterol, fentaNYL (SUBLIMAZE) injection, fentaNYL (SUBLIMAZE) injection, hydrALAZINE, loperamide, ondansetron **OR** ondansetron (ZOFRAN) IV, sodium chloride, sodium chloride  Assessment/ Plan:  39 y.o. female with medical problems of Diabetes type II (since Age 39, poorly controlled) with severe complications including retinopathy and kidney disease, hypertension, obesity who [facility] for evaluation of acute renal failure and anasarca.  1. 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 Acute renal failure from acute cardiorenal syndrome from systolic acute congestive heart failure and now acute respiratory failure requiring mechanical ventilation. Echocardiogram from 11/3 with EF of 40-45% Chronic kidney disease with proteinuria (nephrotic range: 4.9 grams) secondary to  diabetic nephropathy. 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 Baseline creatinine of 1.2 in 08/2014.  - Creatinine stable from last few days. UOP responsive to IV furosemide. Will restart furosemide  IV q6 hours.  - monitor serum electrolytes. No acute indication for dialysis. If volume status and respiratory status do not improve with aggressive diuretic therapy, will proceed with dialysis. Will need dialysis access.  - Renally dose all medication - not a candidate for ACE-I/ARB at this time.  - Screening ANA pending.   2. Acute exacerbation of systolic congestive heart failure with anasarca: currently with more than 6-7 litres of fluid on.  - furosemide as above.  - Will have central line placed - change IV furosemide  q 8  - continue metolazone.  - monitor urine output, volume status  3. 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  - Continue insulin regimen and glucose control.   4. Hypertension: volume driven. Blood pressure better controlled.  -  Current regimen of carvedilol, isosorbide dinitrate, hydralazine and diuretics.  Carvedilol as per cardiology. Status post PEA arrest.  Isosorbide dinitrate and hydralazine for afterload reduction.    LOS: 6 Aixa Corsello 11/8/20169:16 AM

## 2015-10-16 NOTE — Progress Notes (Signed)
Hypoglycemic Event  CBG: 66  Treatment: 25 ml D50 Symptoms:none Follow-up CBG: Time:2235 CBG Result72 Possible Reasons for Event: pt is npo Comments/MD notified:none   Karen Rich A

## 2015-10-16 NOTE — Care Management Note (Signed)
I will closely monitor patients admission for possible need for dialysis after discharge.  Ivor ReiningKim Adaora Mchaney Dialysis Liaison  [phone]

## 2015-10-16 NOTE — Care Management (Signed)
Code blue called last pm for unresponsiveness and ? PEA.  Patient is intubated and unit staff report that she is on the vent.  On propofol. Updated Frances FurbishBayada on patient status.  Report to icu cm

## 2015-10-16 NOTE — Progress Notes (Signed)
[facility] - [place] at [facility]   PATIENT NAME: Karen Rich    MR#:  [id number]  DATE OF BIRTH:  September 29, 1976  SUBJECTIVE:  Remains intubated on vent. REVIEW OF SYSTEMS:   Review of Systems  Unable to perform ROS: intubated   DRUG ALLERGIES:  No Known Allergies VITALS:  Blood pressure 115/60, pulse 69, temperature 98.2 F (36.8 C), temperature source Oral, resp. rate 0, height 5' 3.5" (1.613 m), weight 134.1 kg (295 lb 10.2 oz), last menstrual period 09/08/2015, SpO2 96 %. PHYSICAL EXAMINATION:   Physical Exam  Constitutional: She is well-developed, well-nourished, and in no distress. No distress.  HENT:  Head: Normocephalic.  Intubated on vent  Eyes: No scleral icterus.  Neck: No JVD present. No tracheal deviation present.  Cardiovascular: Exam reveals no gallop and no friction rub.   No murmur heard. Pulmonary/Chest: She has rales.  Abdominal: There is no tenderness.  Musculoskeletal: She exhibits edema.  Patient has anasarca  Neurological:  Intubated on vent and sedation.  Skin: Skin is warm. No rash noted. No erythema.  Psychiatric:  Unable to evaluate due to sedation.  Nursing note and vitals reviewed.  LABORATORY PANEL:   CBC  Recent Labs Lab 10/16/15 0948  WBC 4.7  HGB 7.9*  HCT 25.2*  PLT 298   ------------------------------------------------------------------------------------------------------------------  Chemistries   Recent Labs Lab 10/15/15 2002 10/16/15 0948  NA 135 140  139  K 4.5 4.1  4.1  CL 106 110  110  CO2 GLUCOSE 248* 132*  134*  BUN 50* 52*  52*  CREATININE 3.63* 3.82*  3.80*  CALCIUM 8.8* 8.8*  8.9  MG 2.1  --   AST 31  --   ALT 31  --   ALKPHOS 102  --   BILITOT 0.4  --    ------------------------------------------------------------------------------------------------------------------  Cardiac Enzymes  Recent Labs Lab 10/10/15 2016 10/11/15 0150 10/11/15 0725   TROPONINI 0.03 <0.03 <0.03   ------------------------------------------------------------------------------------------------------------------  RADIOLOGY:  Dg Chest Port 1 View  10/16/2015  CLINICAL DATA:  Respiratory failure.  Intubated. EXAM: PORTABLE CHEST 1 VIEW COMPARISON:  10/15/2015 FINDINGS: Endotracheal tube remains near the carina with the tip approximately 14 mm above the carina. There is cardiomegaly. Moderate layering right pleural effusion. Bilateral vascular congestion and airspace opacities could reflect edema. Mild cardiomegaly. Low lung volumes. IMPRESSION: Endotracheal tube just above the carina approximately 14 mm. Layering right effusion. Bilateral airspace opacities likely reflects edema. Low volumes with cardiomegaly. Electronically Signed   By: Charlett Nose M.D.   On: 10/16/2015 11:55   Dg Chest Port 1 View  10/15/2015  CLINICAL DATA:  Status post intubation. EXAM: PORTABLE CHEST 1 VIEW COMPARISON:  Earlier film, same date. FINDINGS: External pacer paddles are noted. The right IJ center venous catheter tip is in the right atrium and could be retracted 2 cm. The endotracheal tube is right at the carina. Very low lung volumes with vascular crowding atelectasis. Stable cardiac enlargement and pulmonary edema. IMPRESSION: 1. The endotracheal tube is right at the carina and should be retracted approximately 2-3 cm. 2. Right IJ central venous catheter tip is in the right atrium. This could be retracted 2-3 cm. 3. Low lung volumes with vascular crowding, atelectasis and persistent mild interstitial edema. Electronically Signed   By: Rudie Meyer M.D.   On: 10/15/2015 19:26   Dg Chest Port 1 View  10/15/2015  CLINICAL DATA:  Central line placement.  Readjustment. EXAM: PORTABLE CHEST 1  VIEW COMPARISON:  10/15/2015 FINDINGS: Right central line tip is near the cavoatrial junction. Decreasing lung volumes with low lung volumes increasing bilateral lower lobe opacities. Cardiomegaly. No  pneumothorax. Mild vascular congestion. IMPRESSION: Retraction of the right central line with the tip now near the cavoatrial junction. Cardiomegaly, vascular congestion. Decreasing lung volumes with bibasilar atelectasis or infiltrates. Electronically Signed   By: Charlett NoseKevin  Dover M.D.   On: 10/15/2015 14:46   Dg Chest Port 1 View  10/15/2015  CLINICAL DATA:  Central line placement EXAM: PORTABLE CHEST 1 VIEW COMPARISON:  10/10/2015 FINDINGS: Enlarged cardiac silhouette. Hazy attenuation in the perihilar region bilaterally suggesting the possibility of pulmonary edema. Right internal jugular central line identified with tip extending about 3 cm into the right atrium. No pneumothorax. IMPRESSION: No pneumothorax.  Central line extends into right atrium. Electronically Signed   By: Esperanza Heiraymond  Rubner M.D.   On: 10/15/2015 13:51   ASSESSMENT AND PLAN:  39 year old female with a history of type 2 diabetes, hyperlipidemia and chronic kidney disease stage III presents to the ER with ascites and acute on chronic renal failure. More her to intensive care unit after CODE BLUE with asystole and got intubated  * Acute hypoxic resp failure: on vent, sedation, vent mgmt per Intensivist  * PEA Arrest: had CODE BLUE on 11/7, V. Tach> bradycardia> asystole, 2 rounds of ACLS and intubated 11/7  RIJ central line placed by Intensivist  * Acute on chronic combined systolic and diastolic CHF  On lasix 80 mg TID for now per nephro  * Acute on CKD 4: nephro following. 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 Avoid nephrotoxins and close monitoring  * Generalized anasarca: This is likely secondary to renal disease and acute combined diastolic and systolic heart failure.  On lasix TID  *Type 2 diabetes: Continue sliding scale insulin. Continue Lantus. A1c is 6.6  * HTN: continue Coreg, isosorbide dinitrate, hydralazine, and diuretics. Will adjust as need.  * Pressure ulcer present on admission right malleolus with skin  breakdown to buttocks: Continue wound care as per consultation. Cleanse wounds to right calf and right ankle with NS and pat gently dry. Apply Santyl to wound bed, 1/8 inch thickness (opaque). COver with NS moist gauze. COver with 4x4 gauze, kerlix and tape. Change daily. Once edema to abdomen has improved, would like to consider Unnas boots.   * Anemia on Chronic Dz: Hb 7.9. Will monitor.    CODE STATUS: FULL  TOTAL CRITICAL CARE TIME TAKING CARE OF THIS PATIENT: 30 minutes.   D/W Harden MoStephanie Cowans and updated her about patient's condition.  POSSIBLE D/C 3-4 days, DEPENDING ON CLINICAL CONDITION.   [facility], Kevan Prouty M.D on 10/16/2015 at 5:48 PM  Between 7am to 6pm - Pager - [phone] After 6pm go to [email or web address] - password [email or web address]  [facility]  Office  [phone]  CC: Primary care physician; Sherrie MustacheFayegh Jadali, MD  Note: This dictation was prepared with Dragon dictation along with smaller phrase technology. Any transcriptional errors that result from this process are unintentional.

## 2015-10-16 NOTE — Progress Notes (Signed)
Pt is sedated on propofol. On vent.  Rhonchi and crackles throughout. Anarsarca with blisters. Wakeup assessment done.  Pt able to follow commands and nod yes/no. She is anxious and tachypneic when awake. CVP was placed and reads 18-21. Good diuresis with lasix pushes. Seen by Dr Wynelle LinkKolluru (renal) ,Dr Mariah MillingGollan (cards), Dr Dema SeverinMungal (pulm)and Dr Sherryll BurgerShah( prime).  Dr Sherryll BurgerShah spoke with pt sister Judeth CornfieldStephanie this evening.

## 2015-10-16 NOTE — Progress Notes (Addendum)
Patient: Karen Rich / Admit Date: 10/10/2015 / Date of Encounter: 10/16/2015, 10:08 AM   Subjective: Suffered PEA arrest on the evening of 11/7 and was transferred to the ICU. Question of Vfib, though strips appear to show artifact and regular WCT with possible bundle. She remains intubated. No family here at this time.   Review of Systems: Review of Systems  Unable to perform ROS: intubated    Objective: Telemetry: NSR, 70's Physical Exam: Blood pressure 106/55, pulse 71, temperature 97.8 F (36.6 C), temperature source Oral, resp. rate 4, height 5' 3.5" (1.613 m), weight 295 lb 10.2 oz (134.1 kg), last menstrual period 09/08/2015, SpO2 100 %. Body mass index is 51.54 kg/(m^2). General: Well developed, well nourished, in no acute distress. Head: Normocephalic, atraumatic, sclera non-icteric, no xanthomas, nares are without discharge. Neck: Negative for carotid bruits. JVP not elevated. Lungs: Clear bilaterally to auscultation without wheezes, rales, or rhonchi. Breathing is unlabored. Heart: RRR S1 S2 without murmurs, rubs, or gallops.  Abdomen: Non-tender, distended with normoactive bowel sounds. No rebound/guarding. Extremities: No clubbing or cyanosis. 2+ pitting edema throughout all extremities.  Neuro: Intubated and sedated. Psych:  Intubated and sedated.   Intake/Output Summary (Last 24 hours) at 10/16/15 1008 Last data filed at 10/16/15 1001  Gross per 24 hour  Intake 816.58 ml  Output   1705 ml  Net -888.42 ml    Inpatient Medications:  . antiseptic oral rinse  7 mL Mouth Rinse QID  . carvedilol  12.5 mg Oral BID WC  . chlorhexidine gluconate  15 mL Mouth Rinse BID  . collagenase   Topical Daily  . enoxaparin (LOVENOX) injection  30 mg Subcutaneous Q24H  . famotidine (PEPCID) IV  20 mg Intravenous Q12H  . furosemide  80 mg Intravenous Q6H  . hydrALAZINE  37.5 mg Oral 3 times per day  . insulin aspart  0-5 Units Subcutaneous QHS  . insulin aspart  0-9  Units Subcutaneous TID WC  . insulin glargine  12 Units Subcutaneous Daily  . isosorbide dinitrate  10 mg Oral TID  . sodium chloride  10-40 mL Intracatheter Q12H   Infusions:  . propofol (DIPRIVAN) infusion 34.956 mcg/kg/min (10/16/15 0838)    Labs:  Recent Labs  10/15/15 0315 10/15/15 2002  NA 141 135  K 4.3 4.5  CL 115* 106  CO2 22 22  GLUCOSE 98 248*  BUN 50* 50*  CREATININE 3.76* 3.63*  CALCIUM 8.6* 8.8*  MG  --  2.1    Recent Labs  10/15/15 2002  AST 31  ALT 31  ALKPHOS 102  BILITOT 0.4  PROT 5.7*  ALBUMIN 2.0*    Recent Labs  10/14/15 0258 10/15/15 2002  WBC 5.8 7.0  HGB 8.5* 8.6*  HCT 26.8* 25.4*  MCV 88.2 88.6  PLT 322 287   No results for input(s): CKTOTAL, CKMB, TROPONINI in the last 72 hours. Invalid input(s): POCBNP No results for input(s): HGBA1C in the last 72 hours.   Weights: Filed Weights   10/14/15 0359 10/15/15 0653 10/16/15 0425  Weight: 244 lb 4.8 oz (110.814 kg) 235 lb 6.4 oz (106.777 kg) 295 lb 10.2 oz (134.1 kg)     Radiology/Studies:  Dg Chest 2 View  10/10/2015  CLINICAL DATA:  Short of breath and edema EXAM: CHEST  2 VIEW COMPARISON:  07/10/2013 FINDINGS: Lungs are severely under aerated. Small pleural effusions are noted on the lateral view. There is increased opacity towards the lung bases favored represent volume loss.  Developing airspace disease is not excluded. Pulmonary vasculature is within normal limits. IMPRESSION: Low lung volumes. Small bilateral pleural effusions Bibasilar volume loss. Electronically Signed   By: Jolaine ClickArthur  Hoss M.D.   On: 10/10/2015 15:46   [place] Renal  10/10/2015  CLINICAL DATA:  Acute renal failure. EXAM: RENAL / URINARY TRACT ULTRASOUND COMPLETE COMPARISON:  07/10/2013 chest CT. FINDINGS: Right Kidney: Length: 9.3 cm. Echogenic right kidney with no right hydronephrosis and no right renal mass. No right renal cortical atrophy. Left Kidney: Length: 10.7 cm. Echogenic left kidney with no left  hydronephrosis and no left renal mass. No left renal cortical atrophy. Incidentally noted is a small left pleural effusion. Bladder: Appears normal for degree of bladder distention. IMPRESSION: 1. No hydronephrosis. 2. Echogenic normal size kidneys, indicating nonspecific renal parenchymal disease of uncertain chronicity. 3. Incidental small left pleural effusion. 4. Normal bladder. Electronically Signed   By: Delbert PhenixJason A Poff M.D.   On: 10/10/2015 18:59   Dg Chest Port 1 View  10/15/2015  CLINICAL DATA:  Status post intubation. EXAM: PORTABLE CHEST 1 VIEW COMPARISON:  Earlier film, same date. FINDINGS: External pacer paddles are noted. The right IJ center venous catheter tip is in the right atrium and could be retracted 2 cm. The endotracheal tube is right at the carina. Very low lung volumes with vascular crowding atelectasis. Stable cardiac enlargement and pulmonary edema. IMPRESSION: 1. The endotracheal tube is right at the carina and should be retracted approximately 2-3 cm. 2. Right IJ central venous catheter tip is in the right atrium. This could be retracted 2-3 cm. 3. Low lung volumes with vascular crowding, atelectasis and persistent mild interstitial edema. Electronically Signed   By: Rudie MeyerP.  Gallerani M.D.   On: 10/15/2015 19:26   Dg Chest Port 1 View  10/15/2015  CLINICAL DATA:  Central line placement.  Readjustment. EXAM: PORTABLE CHEST 1 VIEW COMPARISON:  10/15/2015 FINDINGS: Right central line tip is near the cavoatrial junction. Decreasing lung volumes with low lung volumes increasing bilateral lower lobe opacities. Cardiomegaly. No pneumothorax. Mild vascular congestion. IMPRESSION: Retraction of the right central line with the tip now near the cavoatrial junction. Cardiomegaly, vascular congestion. Decreasing lung volumes with bibasilar atelectasis or infiltrates. Electronically Signed   By: Charlett NoseKevin  Dover M.D.   On: 10/15/2015 14:46   Dg Chest Port 1 View  10/15/2015  CLINICAL DATA:  Central line  placement EXAM: PORTABLE CHEST 1 VIEW COMPARISON:  10/10/2015 FINDINGS: Enlarged cardiac silhouette. Hazy attenuation in the perihilar region bilaterally suggesting the possibility of pulmonary edema. Right internal jugular central line identified with tip extending about 3 cm into the right atrium. No pneumothorax. IMPRESSION: No pneumothorax.  Central line extends into right atrium. Electronically Signed   By: Esperanza Heiraymond  Rubner M.D.   On: 10/15/2015 13:51     Assessment and Plan   1. PEA arrest/acute respiratory failure: -Patient transferred to ICU on the evening of 11/7 after suffering PEA arrest -CPR was initiated and meds delivered. No shocks. Question of Vfib, though strips appear to show artifact and regular WCT with possible bundle -She has previously been scheduled for stress testing, though did not show for this -She would benefit from ischemic evaluation, however given her renal impairment with CKD stage IV cardiac cath is somewhat precluded at this time without strong indication -[facility] when she is more stable to evaluate for high risk ischemia   2. Acute on chronic combined systolic and diastolic CHF NYHA Class IV with anasarca: -Her  anasarca is out of proportion to her EF and right-sided pressure -Question if the swelling is related her heart failure, CKD, vs malnutrition  -Her central line is a dual lumen rather than a triple lumen and both ports are being used. However, she can have intermittent readings -She would benefit from CVP as we can assess if the volume overload is truly from heart failure. Then if CVP is elevated if she is responding to IV Lasix pushes continue per Renal, otherwise she would likely greatly benefit from CRRT -IV Lasix 80 mg q 8 hours per renal  -Continue metolazone  -Monitor urine output -Replete K+ as needed  3. Acute on CKD stage IV: -No indication for dialysis at this time per Renal, see above -With proteinuria (nephrotic  range 4.9 grams), felt to be 2/2 DM, possibly in the setting of poorly controlled CHF and HTN as well -Renally dose medications -Avoid nephrotoxins  4. Uncontrolled diabetes, type 2: -Per IM  5. HTN with associated renal failure and heart failure, uncontrolled: -Continue Coreg, isosorbide dinitrate, hydralazine, and diuretics   Signed, Eula Listen, PA-C Pager: [phone] 10/16/2015, 10:08 AM   Attending Note Patient seen and examined, agree with detailed note above,  Patient presentation and plan discussed on rounds.    PEA  yesterday ,  wide complex tachycardia noted  appears to be rate related bundle,   Echocardiogram November 3 showing mildly depressed ejection fraction  No prior ischemia workup performed ( she is high risk of ischemia given long history of diabetes) -- currently on 40% oxygen, intubated  CVP is 21,  She has significant edema of her arms and legs.  Changed from Lasix infusion to Lasix bolus IV  ----- Recommend we monitor her urine output, if minimal on Lasix IV bolus, may need to change back to Lasix infusion ------ if no improved urine output on Lasix infusion, may need HD   Once CHF improved, extubated, would consider Myoview to rule out ischemia  We will avoid cardiac catheterization given renal failure  ---- We will monitor closely for additional arrhythmia.  Metoprolol IV push for any tachycardia  High risk of atrial fibrillation and other arrhythmias given her underlying CHF and respiratory distress   Signed: Dossie Arbour  M.D., Ph.D. [facility]

## 2015-10-16 NOTE — Progress Notes (Signed)
Although Dr. Amado CoeGouru asked for families number I didn't see a note that family was contacted regarding pts condition. Left a message with family.

## 2015-10-17 DIAGNOSIS — I469 Cardiac arrest, cause unspecified: Secondary | ICD-10-CM

## 2015-10-17 DIAGNOSIS — I5043 Acute on chronic combined systolic (congestive) and diastolic (congestive) heart failure: Secondary | ICD-10-CM

## 2015-10-17 DIAGNOSIS — J9602 Acute respiratory failure with hypercapnia: Secondary | ICD-10-CM

## 2015-10-17 LAB — RENAL FUNCTION PANEL
ANION GAP: 3 — AB (ref 5–15)
Albumin: 1.8 g/dL — ABNORMAL LOW (ref 3.5–5.0)
BUN: 52 mg/dL — AB (ref 6–20)
CHLORIDE: 112 mmol/L — AB (ref 101–111)
CO2: 23 mmol/L (ref 22–32)
Calcium: 8.4 mg/dL — ABNORMAL LOW (ref 8.9–10.3)
Creatinine, Ser: 3.7 mg/dL — ABNORMAL HIGH (ref 0.44–1.00)
GFR calc Af Amer: 16 mL/min — ABNORMAL LOW (ref >60)
GFR calc non Af Amer: 14 mL/min — ABNORMAL LOW (ref >60)
GLUCOSE: 78 mg/dL (ref 65–99)
POTASSIUM: 3.9 mmol/L (ref 3.5–5.1)
Phosphorus: 5.2 mg/dL — ABNORMAL HIGH (ref 2.5–4.6)
Sodium: 138 mmol/L (ref 135–145)

## 2015-10-17 LAB — GLUCOSE, CAPILLARY
GLUCOSE-CAPILLARY: 71 mg/dL (ref 65–99)
GLUCOSE-CAPILLARY: 81 mg/dL (ref 65–99)
GLUCOSE-CAPILLARY: 77 mg/dL (ref 65–99)
GLUCOSE-CAPILLARY: 68 mg/dL (ref 65–99)
Glucose-Capillary: 50 mg/dL — ABNORMAL LOW (ref 65–99)
Glucose-Capillary: 56 mg/dL — ABNORMAL LOW (ref 65–99)
Glucose-Capillary: 63 mg/dL — ABNORMAL LOW (ref 65–99)
Glucose-Capillary: 85 mg/dL (ref 65–99)
Glucose-Capillary: 89 mg/dL (ref 65–99)
Glucose-Capillary: 93 mg/dL (ref 65–99)

## 2015-10-17 LAB — CBC
HCT: 26.2 % — ABNORMAL LOW (ref 35.0–47.0)
Hemoglobin: 8.4 g/dL — ABNORMAL LOW (ref 12.0–16.0)
MCH: 27.8 pg (ref 26.0–34.0)
MCHC: 32 g/dL (ref 32.0–36.0)
MCV: 86.7 fL (ref 80.0–100.0)
PLATELETS: 297 10*3/uL (ref 150–440)
RBC: 3.02 MIL/uL — ABNORMAL LOW (ref 3.80–5.20)
RDW: 16.3 % — AB (ref 11.5–14.5)
WBC: 3.9 10*3/uL (ref 3.6–11.0)

## 2015-10-17 LAB — MRSA PCR SCREENING: MRSA BY PCR: NEGATIVE

## 2015-10-17 LAB — MAGNESIUM: Magnesium: 1.9 mg/dL (ref 1.7–2.4)

## 2015-10-17 MED ORDER — DEXTROSE 50 % IV SOLN
INTRAVENOUS | Status: AC
  Administered 2015-10-17: 50 mL
  Filled 2015-10-17: qty 50

## 2015-10-17 MED ORDER — DEXTROSE 50 % IV SOLN
INTRAVENOUS | Status: AC
  Administered 2015-10-17: 50 mL via INTRAVENOUS
  Filled 2015-10-17: qty 50

## 2015-10-17 MED ORDER — INSULIN GLARGINE 100 UNIT/ML ~~LOC~~ SOLN
5.0000 [IU] | Freq: Every day | SUBCUTANEOUS | Status: DC
  Administered 2015-10-17: 5 [IU] via SUBCUTANEOUS
  Filled 2015-10-17: qty 0.05

## 2015-10-17 MED ORDER — INSULIN ASPART 100 UNIT/ML ~~LOC~~ SOLN
0.0000 [IU] | SUBCUTANEOUS | Status: DC
  Administered 2015-10-18 – 2015-10-19 (×2): 1 [IU] via SUBCUTANEOUS
  Administered 2015-10-20: 2 [IU] via SUBCUTANEOUS
  Administered 2015-10-20: 1 [IU] via SUBCUTANEOUS
  Administered 2015-10-20: 2 [IU] via SUBCUTANEOUS
  Filled 2015-10-17: qty 1
  Filled 2015-10-17: qty 2
  Filled 2015-10-17: qty 1
  Filled 2015-10-17 (×2): qty 2
  Filled 2015-10-17: qty 1

## 2015-10-17 MED ORDER — DEXTROSE-NACL 5-0.9 % IV SOLN
INTRAVENOUS | Status: DC
  Administered 2015-10-17 – 2015-10-19 (×2): via INTRAVENOUS

## 2015-10-17 MED ORDER — DEXTROSE 50 % IV SOLN
50.0000 mL | INTRAVENOUS | Status: AC
  Administered 2015-10-17: 50 mL via INTRAVENOUS

## 2015-10-17 MED ORDER — METOLAZONE 5 MG PO TABS
5.0000 mg | ORAL_TABLET | Freq: Every day | ORAL | Status: DC
  Administered 2015-10-17 – 2015-10-22 (×6): 5 mg via ORAL
  Filled 2015-10-17 (×7): qty 1

## 2015-10-17 MED FILL — Medication: Qty: 1 | Status: CN

## 2015-10-17 MED FILL — Medication: Qty: 1 | Status: AC

## 2015-10-17 NOTE — Progress Notes (Signed)
[facility] - [place] at [facility]   PATIENT NAME: Karen SailsCamille Naclerio    MR#:  [id number]  DATE OF BIRTH:  June 06, 1976  SUBJECTIVE:  Remains intubated on vent. Off sedation since 8 am, nodes and follows simple commands REVIEW OF SYSTEMS:   Review of Systems  Unable to perform ROS: intubated   DRUG ALLERGIES:  No Known Allergies VITALS:  Blood pressure 138/67, pulse 75, temperature 99 F (37.2 C), temperature source Oral, resp. rate 25, height 5' 3.5" (1.613 m), weight 132.1 kg (291 lb 3.6 oz), last menstrual period 09/08/2015, SpO2 100 %. PHYSICAL EXAMINATION:   Physical Exam  Constitutional: She is well-developed, well-nourished, and in no distress. No distress.  HENT:  Head: Normocephalic.  Intubated on vent  Eyes: No scleral icterus.  Neck: No JVD present. No tracheal deviation present.  Cardiovascular: Exam reveals no gallop and no friction rub.   No murmur heard. Pulmonary/Chest: She has rales.  Abdominal: There is no tenderness.  Musculoskeletal: She exhibits edema.  Patient has anasarca  Neurological:  Intubated on vent - follows simple commands  Skin: Skin is warm. No rash noted. No erythema.  Psychiatric:  Unable to evaluate - being on vent.  Follows simple commands  Nursing note and vitals reviewed.  LABORATORY PANEL:   CBC  Recent Labs Lab 10/17/15 0230  WBC 3.9  HGB 8.4*  HCT 26.2*  PLT 297   ------------------------------------------------------------------------------------------------------------------  Chemistries   Recent Labs Lab 10/15/15 2002  10/17/15 0230  NA 135  < > 138  K 4.5  < > 3.9  CL 106  < > 112*  CO2 22  < > 23  GLUCOSE 248*  < > 78  BUN 50*  < > 52*  CREATININE 3.63*  < > 3.70*  CALCIUM 8.8*  < > 8.4*  MG 2.1  --  1.9  AST 31  --   --   ALT 31  --   --   ALKPHOS 102  --   --   BILITOT 0.4  --   --   < > = values in this interval not  displayed. ------------------------------------------------------------------------------------------------------------------  Cardiac Enzymes  Recent Labs Lab 10/10/15 2016 10/11/15 0150 10/11/15 0725  TROPONINI 0.03 <0.03 <0.03   ------------------------------------------------------------------------------------------------------------------  RADIOLOGY:  Dg Chest Port 1 View  10/16/2015  CLINICAL DATA:  Respiratory failure.  Intubated. EXAM: PORTABLE CHEST 1 VIEW COMPARISON:  10/15/2015 FINDINGS: Endotracheal tube remains near the carina with the tip approximately 14 mm above the carina. There is cardiomegaly. Moderate layering right pleural effusion. Bilateral vascular congestion and airspace opacities could reflect edema. Mild cardiomegaly. Low lung volumes. IMPRESSION: Endotracheal tube just above the carina approximately 14 mm. Layering right effusion. Bilateral airspace opacities likely reflects edema. Low volumes with cardiomegaly. Electronically Signed   By: Charlett NoseKevin  Dover M.D.   On: 10/16/2015 11:55   Dg Chest Port 1 View  10/15/2015  CLINICAL DATA:  Status post intubation. EXAM: PORTABLE CHEST 1 VIEW COMPARISON:  Earlier film, same date. FINDINGS: External pacer paddles are noted. The right IJ center venous catheter tip is in the right atrium and could be retracted 2 cm. The endotracheal tube is right at the carina. Very low lung volumes with vascular crowding atelectasis. Stable cardiac enlargement and pulmonary edema. IMPRESSION: 1. The endotracheal tube is right at the carina and should be retracted approximately 2-3 cm. 2. Right IJ central venous catheter tip is in the right atrium. This could be retracted 2-3 cm. 3. Low  lung volumes with vascular crowding, atelectasis and persistent mild interstitial edema. Electronically Signed   By: Rudie Meyer M.D.   On: 10/15/2015 19:26   ASSESSMENT AND PLAN:  39 year old female with a history of type 2 diabetes, hyperlipidemia and chronic  kidney disease stage III presents to the ER with ascites and acute on chronic renal failure. More her to intensive care unit after CODE BLUE with asystole and got intubated  * Acute hypoxic resp failure: on vent (day 2), off sedation since 8 am and follows simple commands, nods head, vent mgmt per Intensivist - on SBT today but so far no luck as she is somewhat lethargic.  * PEA Arrest: had CODE BLUE on 11/7, V. Tach> bradycardia> asystole, 2 rounds of ACLS and intubated 11/7  RIJ central line placed by Intensivist on 11/7  * Hypoglycemia with H/o Type 2 diabetes: Continue sliding scale insulin. Hold  Lantus. A1c is 6.6  * Acute on chronic combined systolic and diastolic CHF  On lasix 80 mg TID for now per nephro - continue metolazone  * Acute on CKD 4: nephro following. 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 Avoid nephrotoxins and close monitoring. Creat 3.7  * Generalized anasarca: This is likely secondary to renal disease and acute combined diastolic and systolic heart failure. On lasix TID  * HTN: continue Coreg, isosorbide dinitrate, hydralazine, and diuretics. Will adjust as need.  * Pressure ulcer present on admission right malleolus with skin breakdown to buttocks: Continue wound care as per consultation. Cleanse wounds to right calf and right ankle with NS and pat gently dry. Apply Santyl to wound bed, 1/8 inch thickness (opaque). Cover with NS moist gauze. Cover with 4x4 gauze, kerlix and tape. Change daily. Once edema to abdomen has improved, would like to consider Unnas boots.   * Anemia on Chronic Dz: Hb 7.9->8.4. Will monitor.    CODE STATUS: FULL CODE  TOTAL CRITICAL CARE TIME TAKING CARE OF THIS PATIENT: 30 minutes.   D/W Harden Mo and updated her about patient's condition   POSSIBLE D/C 3-4 days, DEPENDING ON CLINICAL CONDITION.   [facility], Sadira Standard M.D on 10/17/2015 at 4:43 PM  Between 7am to 6pm - Pager - [phone] After 6pm go to [email or web address] - password [email or web address]  [facility]  [facility]  Office  [phone]  CC: Primary care physician; Sherrie Mustache, MD  Note: This dictation was prepared with Dragon dictation along with smaller phrase technology. Any transcriptional errors that result from this process are unintentional.

## 2015-10-17 NOTE — Consult Note (Signed)
PULMONARY / CRITICAL CARE MEDICINE   Name: Karen PellegriniCamille Y Derocher MRN: [id number] DOB: [birth year]    ADMISSION DATE:  10/10/2015 CONSULTATION DATE:  10/16/15  REFERRING MD : Dr. Amado CoeGouru   CHIEF COMPLAINT:     Cardiac arrest, respiratory failure   HISTORY OF PRESENT ILLNESS - per chart review   Patient admitted on 10/10/15 with the below history: 39 y.o. female with a known history of diabetes type 2, hyperlipidemia, hypertension, chronic kidney disease stage III who presents to the ED with complaint of swelling of her body diffusely. She reports that she stop taking all her medication including her insulin a few months ago. She also reports that she's been having shortness of breath ongoing for the past few months and is unable to lay flat and has to sleep in a chair. She denies any chest pain fevers or chills. Denies any nausea vomiting or diarrhea  On 10/15/15 the following events occurred Code blue was called as pt was c/o chest pain , gasping and became unresponsive . Patient was found to be bradycardic and eventually asystole-ACLS protocol was implemented for PEA by the ED physician. Once patient's rhythm was back she was intubated and moved to intensive care unit. Her rhythm strips were suggestive of ventricular fibrillation, however patient was able to move her eyes also moving her RUE to verbal commands, and hypothermia protocol was not initiated.   SUBJECTIVE: Patient more interactive today, on pressure support mode, sedation turned off, following simple commands, but very somnolent, however easily arousable.  SIGNIFICANT EVENTS    11/7>> CODE BLUE, V. Tach> bradycardia> asystole, 2 rounds of ACLS and intubated 11/7>> RIJ CVL   PAST MEDICAL HISTORY    :  Past Medical History  Diagnosis Date  . Edema   . Hyperlipidemia   . Hyperglycemia   . Fatigue   . Irregular heart beat   . Hypertension   . Diabetes mellitus without complication (HCC)   . Renal disorder   . Renal failure      stage 3   Past Surgical History  Procedure Laterality Date  . None     Prior to Admission medications   Not on File   No Known Allergies   FAMILY HISTORY   Family History  Problem Relation Age of Onset  . Hypertension Mother   . Diabetes Mother   . Hypertension Father   . CVA Father       SOCIAL HISTORY    reports that she has never smoked. She does not have any smokeless tobacco history on file. She reports that she does not drink alcohol or use illicit drugs.  Review of Systems  Unable to perform ROS: intubated      VITAL SIGNS    Temp:  [94.2 F (34.6 C)-98.1 F (36.7 C)] 98 F (36.7 C) (11/09 1300) Pulse Rate:  [59-74] 74 (11/09 1300) Resp:  [0-29] 23 (11/09 1300) BP: (109-162)/(59-79) 139/62 mmHg (11/09 1300) SpO2:  [96 %-100 %] 100 % (11/09 1300) FiO2 (%):  [28 %-40 %] 28 % (11/09 1138) Weight:  [291 lb 3.6 oz (132.1 kg)] 291 lb 3.6 oz (132.1 kg) (11/09 0343) HEMODYNAMICS: CVP:  [17 mmHg-28 mmHg] 22 mmHg VENTILATOR SETTINGS: Vent Mode:  [-] Spontaneous FiO2 (%):  [28 %-40 %] 28 % Set Rate:  [14 bmp] 14 bmp Vt Set:  [500 mL] 500 mL PEEP:  [5 cmH20] 5 cmH20 Pressure Support:  [0 cmH20-5 cmH20] 0 cmH20 Plateau Pressure:  [27 cmH20] 27 cmH20 INTAKE /  OUTPUT:  Intake/Output Summary (Last 24 hours) at 10/17/15 1456 Last data filed at 10/17/15 1300  Gross per 24 hour  Intake 517.53 ml  Output   4150 ml  Net -3632.47 ml       PHYSICAL EXAM   Physical Exam  Constitutional: She appears well-developed.  HENT:  Head: Normocephalic.  Right Ear: External ear normal.  Left Ear: External ear normal.  Neck: Neck supple. No thyromegaly present.  Cardiovascular: Normal rate, regular rhythm, normal heart sounds and intact distal pulses.   No murmur heard. Pulmonary/Chest: She has no wheezes.  Intubated on ventilator, mild coarse upper airway sounds  Abdominal: Soft. Bowel sounds are normal. She exhibits no distension.  Musculoskeletal:  Edema  to mid legs Generalized anasarca  Neurological:  Intubated and sedated  Skin: Skin is warm and dry.       LABS   LABS:  CBC  Recent Labs Lab 10/15/15 2002 10/16/15 0948 10/17/15 0230  WBC 7.0 4.7 3.9  HGB 8.6* 7.9* 8.4*  HCT 25.4* 25.2* 26.2*  PLT 287 298 297   Coag's No results for input(s): APTT, INR in the last 168 hours. BMET  Recent Labs Lab 10/15/15 2002 10/16/15 0948 10/17/15 0230  NA 135 140  139 138  K 4.5 4.1  4.1 3.9  CL 106 110  110 112*  CO2 22 24  23 23   BUN 50* 52*  52* 52*  CREATININE 3.63* 3.82*  3.80* 3.70*  GLUCOSE 248* 132*  134* 78   Electrolytes  Recent Labs Lab 10/10/15 2016  10/15/15 2002 10/16/15 0948 10/17/15 0230  CALCIUM 8.7*  < > 8.8* 8.8*  8.9 8.4*  MG 2.1  --  2.1  --  1.9  PHOS  --   --   --  5.2* 5.2*  < > = values in this interval not displayed. Sepsis Markers No results for input(s): LATICACIDVEN, PROCALCITON, O2SATVEN in the last 168 hours. ABG  Recent Labs Lab 10/15/15 2146 10/16/15 1145  PHART 7.30* 7.39  PCO2ART 43 35  PO2ART 85 152*   Liver Enzymes  Recent Labs Lab 10/10/15 2016 10/15/15 2002 10/16/15 0948 10/17/15 0230  AST 30 31  --   --   ALT 37 31  --   --   ALKPHOS 117 102  --   --   BILITOT 0.5 0.4  --   --   ALBUMIN 2.2* 2.0* 1.9* 1.8*   Cardiac Enzymes  Recent Labs Lab 10/10/15 2016 10/11/15 0150 10/11/15 0725  TROPONINI 0.03 <0.03 <0.03   Glucose  Recent Labs Lab 10/17/15 10/17/15 0051 10/17/15 0724 10/17/15 0818 10/17/15 1046 10/17/15 1119  GLUCAP 63* 85 50* 93 81 71     Recent Results (from the past 240 hour(s))  C difficile quick scan w PCR reflex     Status: None   Collection Time: 10/10/15 10:21 PM  Result Value Ref Range Status   C Diff antigen NEGATIVE NEGATIVE Final   C Diff toxin NEGATIVE NEGATIVE Final   C Diff interpretation Negative for C. difficile  Final  C difficile quick scan w PCR reflex     Status: None   Collection Time: 10/12/15  10:35 AM  Result Value Ref Range Status   C Diff antigen NEGATIVE NEGATIVE Final   C Diff toxin NEGATIVE NEGATIVE Final   C Diff interpretation Negative for C. difficile  Final  MRSA PCR Screening     Status: None   Collection Time: 10/17/15  9:02 AM  Result Value Ref Range Status   MRSA by PCR NEGATIVE NEGATIVE Final    Comment:        The GeneXpert MRSA Assay (FDA approved for NASAL specimens only), is one component of a comprehensive MRSA colonization surveillance program. It is not intended to diagnose MRSA infection nor to guide or monitor treatment for MRSA infections.      Current facility-administered medications:  .  0.9 %  sodium chloride infusion, 250 mL, Intravenous, PRN, Auburn Bilberry, MD .  acetaminophen (TYLENOL) tablet 650 mg, 650 mg, Oral, Q6H PRN **OR** acetaminophen (TYLENOL) suppository 650 mg, 650 mg, Rectal, Q6H PRN, Auburn Bilberry, MD .  albuterol (PROVENTIL) (2.5 MG/3ML) 0.083% nebulizer solution 2.5 mg, 2.5 mg, Nebulization, Q2H PRN, Lupita Leash, MD .  antiseptic oral rinse solution (CORINZ), 7 mL, Mouth Rinse, QID, Avarey Yaeger, MD, 7 mL at 10/17/15 1350 .  carvedilol (COREG) tablet 12.5 mg, 12.5 mg, Oral, BID WC, Auburn Bilberry, MD, 12.5 mg at 10/17/15 0800 .  chlorhexidine gluconate (PERIDEX) 0.12 % solution 15 mL, 15 mL, Mouth Rinse, BID, Lilian Fuhs, MD, 15 mL at 10/17/15 0738 .  collagenase (SANTYL) ointment, , Topical, Daily, Sital Mody, MD .  dextrose 5 %-0.9 % sodium chloride infusion, , Intravenous, Continuous, Arnaldo Natal, MD, Last Rate: 10 mL/hr at 10/17/15 0700 .  enoxaparin (LOVENOX) injection 40 mg, 40 mg, Subcutaneous, Q24H, Jamielee Mchale, MD, 40 mg at 10/16/15 2111 .  famotidine (PEPCID) IVPB 20 mg premix, 20 mg, Intravenous, Q12H, Lupita Leash, MD, 20 mg at 10/17/15 1044 .  fentaNYL (SUBLIMAZE) injection 100 mcg, 100 mcg, Intravenous, Q15 min PRN, Lupita Leash, MD .  fentaNYL (SUBLIMAZE) injection 100 mcg, 100  mcg, Intravenous, Q2H PRN, Lupita Leash, MD .  furosemide (LASIX) injection 80 mg, 80 mg, Intravenous, Q6H, Lamont Dowdy, MD, 80 mg at 10/17/15 1017 .  hydrALAZINE (APRESOLINE) injection 10 mg, 10 mg, Intravenous, Q6H PRN, Auburn Bilberry, MD .  hydrALAZINE (APRESOLINE) tablet 37.5 mg, 37.5 mg, Oral, 3 times per day, Tonny Bollman, MD, 37.5 mg at 10/15/15 1433 .  insulin aspart (novoLOG) injection 0-9 Units, 0-9 Units, Subcutaneous, 6 times per day, Delfino Lovett, MD, 0 Units at 10/17/15 1200 .  insulin glargine (LANTUS) injection 5 Units, 5 Units, Subcutaneous, Daily, Tequisha Maahs, MD, 5 Units at 10/17/15 1052 .  isosorbide dinitrate (ISORDIL) tablet 10 mg, 10 mg, Oral, TID, Tonny Bollman, MD, 10 mg at 10/17/15 1017 .  loperamide (IMODIUM) capsule 2 mg, 2 mg, Oral, Q6H PRN, Adrian Saran, MD, 2 mg at 10/15/15 1035 .  metolazone (ZAROXOLYN) tablet 5 mg, 5 mg, Oral, Daily, Lamont Dowdy, MD, 5 mg at 10/17/15 1044 .  ondansetron (ZOFRAN) tablet 4 mg, 4 mg, Oral, Q6H PRN **OR** ondansetron (ZOFRAN) injection 4 mg, 4 mg, Intravenous, Q6H PRN, Auburn Bilberry, MD .  propofol (DIPRIVAN) 1000 MG/100ML infusion, 0-50 mcg/kg/min, Intravenous, Continuous, Lupita Leash, MD, Stopped at 10/17/15 [phone] .  sodium chloride 0.9 % injection 10-40 mL, 10-40 mL, Intracatheter, Q12H, Sital Mody, MD, 10 mL at 10/17/15 1052 .  sodium chloride 0.9 % injection 10-40 mL, 10-40 mL, Intracatheter, PRN, Adrian Saran, MD .  sodium chloride 0.9 % injection 3 mL, 3 mL, Intravenous, PRN, Auburn Bilberry, MD, 3 mL at 10/11/15 0523  IMAGING    No results found.    Indwelling Urinary Catheter continued, requirement due to   Reason to continue Indwelling Urinary Catheter for strict Intake/Output monitoring for hemodynamic instability   Central Line  continued, requirement due to   Reason to continue [employer] Monitoring of central venous pressure or other hemodynamic parameters   Ventilator continued, requirement due  to, resp failure    Ventilator Sedation RASS 0 to -2   Cultures: BCx2  UC  Sputum  Antibiotics:  Lines: RIJ CVL 11/7>>  ASSESSMENT/PLAN  39 year old female admitted for anasarca, CKD stage II, had a cardiac arrest on the medical floor, transferred to the medical ICU after 2 rounds of ACLS, intubated and sedated.  PULMONARY Respiratory failure Cardiac arrest Requirement for mechanical ventilation New onset CHF (combined systolic and diastolic) P:   -Continue mechanical ventilation, wean as tolerated, ABGs as needed -Maintain O2 sat greater than 88% -Respiratory failure complicated by this new onset CHF -Planning for diuresis -Monitor creatinine levels during diuresis -More awake today, somnolent, but easily arousable, once able to maintain level of awakeness, we'll plan for extubation  CARDIOVASCULAR CVL RIJ A:  PEA arrest Combined CHF Anasarca Hypertension P:  -Now with stable cardiac rhythm, being evaluated by cardiology -Her EF is 40-45 percent -Cardiology actively following, has combined systolic and diastolic CHF, currently being treated with somewhat aggressive diuresis -Will require further outpatient cardiology workup -Diuresis as dictated by renal  RENAL History of chronic kidney disease Elevated creatinine P:   -Monitor creatinine levels, continue with diuresis as tolerated -Appreciate nephrology recommendations -ICU electrolyte replacement protocol  GASTROINTESTINAL -GI prophylaxis  HEMATOLOGIC A:  Anemia P:  -Monitor blood counts, could be dilutional   ENDOCRINE Type 2 diabetes -Continue with SSI  NEUROLOGIC A:  Intubated and sedated P:   RASS goal: 0 -Had a short sedation vacation, was able to follow commands, but became increasingly agitated and this increased on the vent. Will continue with vent weaning protocol and sedation vacation    I have personally obtained a history, examined the patient, evaluated laboratory and imaging  results, formulated the assessment and plan and placed orders.  The Patient requires high complexity decision making for assessment and support, frequent evaluation and titration of therapies, application of advanced monitoring technologies and extensive interpretation of multiple databases. Critical Care Time devoted to patient care services described in this note is 45 minutes.   Overall, patient is critically ill, prognosis is guarded. Patient at high risk for cardiac arrest and death.   Stephanie Acre, MD [facility] Pager [phone] (please enter 7-digits) On Call Pager - [phone] (please enter 7-digits)     10/17/2015, 2:56 PM  Note: This note was prepared with Dragon dictation along with smaller phrase technology. Any transcriptional errors that result from this process are unintentional.

## 2015-10-17 NOTE — Progress Notes (Signed)
Nutrition Follow-up    INTERVENTION:   EN: if unable to extubate within 24 hours, recommend initiation of nutrition support   NUTRITION DIAGNOSIS:   Inadequate oral intake related to acute illness as evidenced by NPO status.  Previous PES statement resolved  GOAL:   Provide needs based on ASPEN/SCCM guidelines   MONITOR:    (Energy Intake, Anthropometrics, Electrolyte/Renal Profile)  REASON FOR ASSESSMENT:   Diagnosis    ASSESSMENT:     Pt s/p code blue, PEA arrest on 11/7 requiring intubation, acute on chronic CHF, acute on CKD IV  Diet Order:  Diet NPO time specified   Energy Intake: pt previously with good appetite, eating 100% of meals since admission on solid diet  Electrolyte and Renal Profile:  Recent Labs Lab 10/10/15 2016  10/15/15 2002 10/16/15 0948 10/17/15 0230  BUN  --   < > 50* 52*  52* 52*  CREATININE 3.11*  < > 3.63* 3.82*  3.80* 3.70*  NA  --   < > 135 140  139 138  K  --   < > 4.5 4.1  4.1 3.9  MG 2.1  --  2.1  --  1.9  PHOS  --   --   --  5.2* 5.2*  < > = values in this interval not displayed. Glucose Profile:   Recent Labs  10/17/15 0818 10/17/15 1046 10/17/15 1119  GLUCAP 93 81 71   Meds: lasix, ss novolog, lantus, imodium   Height:   Ht Readings from Last 1 Encounters:  10/10/15 5' 3.5" (1.613 m)    Weight:   Wt Readings from Last 1 Encounters:  10/17/15 291 lb 3.6 oz (132.1 kg)   BMI:  Body mass index is 50.77 kg/(m^2).  Estimated Nutritional Needs:   Kcal:  1210-1375 kcals using IBW 55 kg (22-25 kcals/kg) per ASPEN guidelines  Protein:  138 g (2.5 g/kg)   Fluid:  1375-1650 mL (25-30 ml/kg)   EDUCATION NEEDS:   Education needs addressed  HIGH Care Level  Romelle Starcherate Riva Sesma MS, RD, LDN [phone] Pager

## 2015-10-17 NOTE — Progress Notes (Signed)
Sedation off since 8 am.  Pt is slow to become fully awake. Follows commands.  Mouths words.  On spontaneous vent mode all day until 1645.  At that time she was place on a rate after seen by Dr Dema SeverinMungal. Propofol gtt not needed all day until 1845. It was restarted at 15 mcg/kg for increasing restlessness and pt picking at ETT holder. Good diuresis with Lasix boluses.  Anasarca with some improvement. No stools today. Mother in to see late afternoon. Blood sugars have required D50 x2 today.

## 2015-10-17 NOTE — Progress Notes (Signed)
Subjective:   Remains critically ill UOP  3880 on furosemide  IV q6 and metolazone  Objective:  Vital signs in last 24 hours:  Temp:  [94.2 F (34.6 C)-98.2 F (36.8 C)] 94.2 F (34.6 C) (11/09 0726) Pulse Rate:  [59-72] 62 (11/09 0800) Resp:  [0-16] 14 (11/09 0800) BP: (106-150)/(55-85) 137/75 mmHg (11/09 0800) SpO2:  [96 %-100 %] 100 % (11/09 0800) FiO2 (%):  [28 %-40 %] 28 % (11/09 0346) Weight:  [132.1 kg (291 lb 3.6 oz)] 132.1 kg (291 lb 3.6 oz) (11/09 0343)  Weight change: -2 kg (-4 lb 6.6 oz) Filed Weights   10/15/15 0653 10/16/15 0425 10/17/15 0343  Weight: 106.777 kg (235 lb 6.4 oz) 134.1 kg (295 lb 10.2 oz) 132.1 kg (291 lb 3.6 oz)    Intake/Output:    Intake/Output Summary (Last 24 hours) at 10/17/15 0913 Last data filed at 10/17/15 0800  Gross per 24 hour  Intake 654.36 ml  Output   4327 ml  Net -3672.64 ml     Physical Exam: General: Critically ill, +anasarca  HEENT ETT  Neck Right central line  Pulm/lungs Clear, vent assisted, PRVC 28%  CVS/Heart Regular rhythm  Abdomen:  +distended, +abdominal wall edema  Extremities: 2+ pitting edema all four extremities  Neurologic: Alert, oriented, ambulatory  Skin: excoriations          Basic Metabolic Panel:   Recent Labs Lab 10/10/15 2016  10/14/15 0258 10/15/15 0315 10/15/15 2002 10/16/15 0948 10/17/15 0230  NA  --   < > 139 141 135 140  139 138  K  --   < > 4.5 4.3 4.5 4.1  4.1 3.9  CL  --   < > 112* 115* 106 110  110 112*  CO2  --   < > 20* GLUCOSE  --   < > 151* 98 248* 132*  134* 78  BUN  --   < > 47* 50* 50* 52*  52* 52*  CREATININE 3.11*  < > 3.55* 3.76* 3.63* 3.82*  3.80* 3.70*  CALCIUM 8.7*  < > 8.4* 8.6* 8.8* 8.8*  8.9 8.4*  MG 2.1  --   --   --  2.1  --  1.9  PHOS  --   --   --   --   --  5.2* 5.2*  < > = values in this interval not displayed.   CBC:  Recent Labs Lab 10/10/15 1446 10/10/15 2016 10/14/15 0258 10/15/15 2002 10/16/15 0948  10/17/15 0230  WBC 6.4 6.5 5.8 7.0 4.7 3.9  NEUTROABS 4.9  --   --   --   --   --   HGB 9.8* 9.8* 8.5* 8.6* 7.9* 8.4*  HCT 31.0* 31.1* 26.8* 25.4* 25.2* 26.2*  MCV 88.1 88.1 88.2 88.6 87.7 86.7  PLT 410 425 322 287 298 297      Microbiology:  Recent Results (from the past 720 hour(s))  C difficile quick scan w PCR reflex     Status: None   Collection Time: 10/10/15 10:21 PM  Result Value Ref Range Status   C Diff antigen NEGATIVE NEGATIVE Final   C Diff toxin NEGATIVE NEGATIVE Final   C Diff interpretation Negative for C. difficile  Final  C difficile quick scan w PCR reflex     Status: None   Collection Time: 10/12/15 10:35 AM  Result Value Ref Range Status   C Diff antigen NEGATIVE NEGATIVE Final  C Diff toxin NEGATIVE NEGATIVE Final   C Diff interpretation Negative for C. difficile  Final    Coagulation Studies: No results for input(s): LABPROT, INR in the last 72 hours.  Urinalysis: No results for input(s): COLORURINE, LABSPEC, PHURINE, GLUCOSEU, HGBUR, BILIRUBINUR, KETONESUR, PROTEINUR, UROBILINOGEN, NITRITE, LEUKOCYTESUR in the last 72 hours.  Invalid input(s): APPERANCEUR    Imaging: Dg Chest Port 1 View  10/16/2015  CLINICAL DATA:  Respiratory failure.  Intubated. EXAM: PORTABLE CHEST 1 VIEW COMPARISON:  10/15/2015 FINDINGS: Endotracheal tube remains near the carina with the tip approximately 14 mm above the carina. There is cardiomegaly. Moderate layering right pleural effusion. Bilateral vascular congestion and airspace opacities could reflect edema. Mild cardiomegaly. Low lung volumes. IMPRESSION: Endotracheal tube just above the carina approximately 14 mm. Layering right effusion. Bilateral airspace opacities likely reflects edema. Low volumes with cardiomegaly. Electronically Signed   By: Charlett Nose M.D.   On: 10/16/2015 11:55   Dg Chest Port 1 View  10/15/2015  CLINICAL DATA:  Status post intubation. EXAM: PORTABLE CHEST 1 VIEW COMPARISON:  Earlier film,  same date. FINDINGS: External pacer paddles are noted. The right IJ center venous catheter tip is in the right atrium and could be retracted 2 cm. The endotracheal tube is right at the carina. Very low lung volumes with vascular crowding atelectasis. Stable cardiac enlargement and pulmonary edema. IMPRESSION: 1. The endotracheal tube is right at the carina and should be retracted approximately 2-3 cm. 2. Right IJ central venous catheter tip is in the right atrium. This could be retracted 2-3 cm. 3. Low lung volumes with vascular crowding, atelectasis and persistent mild interstitial edema. Electronically Signed   By: Rudie Meyer M.D.   On: 10/15/2015 19:26   Dg Chest Port 1 View  10/15/2015  CLINICAL DATA:  Central line placement.  Readjustment. EXAM: PORTABLE CHEST 1 VIEW COMPARISON:  10/15/2015 FINDINGS: Right central line tip is near the cavoatrial junction. Decreasing lung volumes with low lung volumes increasing bilateral lower lobe opacities. Cardiomegaly. No pneumothorax. Mild vascular congestion. IMPRESSION: Retraction of the right central line with the tip now near the cavoatrial junction. Cardiomegaly, vascular congestion. Decreasing lung volumes with bibasilar atelectasis or infiltrates. Electronically Signed   By: Charlett Nose M.D.   On: 10/15/2015 14:46   Dg Chest Port 1 View  10/15/2015  CLINICAL DATA:  Central line placement EXAM: PORTABLE CHEST 1 VIEW COMPARISON:  10/10/2015 FINDINGS: Enlarged cardiac silhouette. Hazy attenuation in the perihilar region bilaterally suggesting the possibility of pulmonary edema. Right internal jugular central line identified with tip extending about 3 cm into the right atrium. No pneumothorax. IMPRESSION: No pneumothorax.  Central line extends into right atrium. Electronically Signed   By: Esperanza Heir M.D.   On: 10/15/2015 13:51     Medications:   . dextrose 5 % and 0.9% NaCl 10 mL/hr at 10/17/15 0334  . propofol (DIPRIVAN) infusion Stopped  (10/17/15 0742)   . antiseptic oral rinse  7 mL Mouth Rinse QID  . carvedilol  12.5 mg Oral BID WC  . chlorhexidine gluconate  15 mL Mouth Rinse BID  . collagenase   Topical Daily  . enoxaparin (LOVENOX) injection  40 mg Subcutaneous Q24H  . famotidine (PEPCID) IV  20 mg Intravenous Q12H  . furosemide  80 mg Intravenous Q6H  . hydrALAZINE  37.5 mg Oral 3 times per day  . insulin aspart  0-9 Units Subcutaneous 6 times per day  . isosorbide dinitrate  10 mg Oral TID  .  metolazone  5 mg Oral Daily  . sodium chloride  10-40 mL Intracatheter Q12H   sodium chloride, acetaminophen **OR** acetaminophen, albuterol, fentaNYL (SUBLIMAZE) injection, fentaNYL (SUBLIMAZE) injection, hydrALAZINE, loperamide, ondansetron **OR** ondansetron (ZOFRAN) IV, sodium chloride, sodium chloride  Assessment/ Plan:  39 y.o. female with medical problems of Diabetes type II (since Age 39, poorly controlled) with severe complications including retinopathy and kidney disease, hypertension, obesity who was admitted to [facility] on 10/10/2015 for evaluation of acute renal failure and anasarca.  1. 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 Acute renal failure from acute cardiorenal syndrome from systolic acute congestive heart failure and now acute respiratory failure requiring mechanical ventilation. Echocardiogram from 11/3 with EF of 40-45%. Also with hypotension during cardiac arrest.  Chronic kidney disease with proteinuria (nephrotic range: 4.9 grams) secondary to diabetic nephropathy. 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 Baseline creatinine of 1.2 in 08/2014. Unclear how much progression of disease  - Creatinine stable from last few days. UOP responsive to IV furosemide. Continue furosemide 80mg  IV q6 hours. Restart metolazone - monitor serum electrolytes. No acute indication for dialysis. If volume status and respiratory status do not  improve with aggressive diuretic therapy, will proceed with dialysis. Will need dialysis access.  - Renally dose all medication - not a candidate for ACE-I/ARB at this time.  - Screening ANA pending.   2. Acute exacerbation of systolic congestive heart failure with anasarca: currently with significant volume overload - furosemide and metolazone as above.  - monitor urine output, volume status  3. 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  - Continue insulin regimen and glucose control.   4. Hypertension: volume driven. Blood pressure better controlled.  - Current regimen of carvedilol, isosorbide dinitrate, hydralazine and diuretics.  Carvedilol as per cardiology.   Isosorbide dinitrate and hydralazine for afterload reduction.    LOS: 7 Karen Rich 11/9/20169:13 AM

## 2015-10-17 NOTE — Consult Note (Signed)
WOC wound consult note Reason for Consult: Multiple areas of nonintact skin. Moisture Associated Skin Damage (MASD) to buttocks and perineal area. Full thickness skin loss to right lateral calf and right lateral malleolus. Generalized edema. CHF exacerbation, acute.  PEA Arrest and resp failure, 10/16/15.  Intubated and nonverbal today.  Wound type:MASD, venous insufficiency to lower extremities. Trauma wound to right malleolus. Patient previously stated this area opened after walking in a shoe.  Pressure Ulcer POA: Yes right malleolus, unstageable pressure injury Measurement:Right lateral malleolus 1.2 cm x 1 cm unable to visualize wound bed due to slough Right lateral calf 4 cm x 3.2 cm x 0.1 cm wound bed 25% slough MASD to right buttocks 1 cm x 1 cm x 0.1 cm scattered nonintact lesions to perineal area, somewhat improved.   Hard, nonpitting edema to upper thighs and abdomen present. No skin breakdown under abdominal pannus or under breasts.   Drainage (amount, consistency, odor) Minimal serosanguinous. No odor from wounds.  Periwound:Edema, generalized Dressing procedure/placement/frequency: Suggested wound care center consult to patient and she is considering this. Financial and transportation are concerns for her.  Cleanse wounds to right calf and right ankle with NS and pat gently dry. Apply Santyl to wound bed, 1/8 inch thickness (opaque). COver with NS moist gauze. COver with 4x4 gauze, kerlix and tape. Change daily. Edema not much  improved, respiratory failure.  Ischemia a concern at this time.  No compression will be initiated.  WOC team will follow Karen HudsonKaren Korene Dula RN BSN [facility] Pager [phone]

## 2015-10-17 NOTE — Progress Notes (Signed)
Inpatient Diabetes Program Recommendations  AACE/ADA: New Consensus Statement on Inpatient Glycemic Control (2015)  Target Ranges:  Prepandial:   less than 140 mg/dL      Peak postprandial:   less than 180 mg/dL (1-2 hours)      Critically ill patients:  140 - 180 mg/dL  Results for Felicity PellegriniHAITH, Macklyn Y (MRN [id number]) as of 10/17/2015 07:24  Ref. Range 10/16/2015 07:29 10/16/2015 09:41 10/16/2015 11:37 10/16/2015 16:18 10/16/2015 21:57 10/16/2015 22:40 10/17/2015 00:00 10/17/2015 00:51  Glucose-Capillary Latest Ref Range: 65-99 mg/dL 782131 (H) 956121 (H) 213128 (H) 103 (H) 66 72 63 (L) 85  Results for Felicity PellegriniHAITH, Terrance Y (MRN [id number]) as of 10/17/2015 07:24  Ref. Range 10/11/2015 07:25  Hemoglobin A1C Latest Ref Range: 4.0-6.0 % 6.6 (H)   Review of Glycemic Control  Current orders for Inpatient glycemic control: Lantus 12 units daily, Novolog 0-9 units TID with meals  Inpatient Diabetes Program Recommendations:  Insulin - Basal: Patient received Lantus 12 units on 10/16/15 at 10:23 am. Glucose down to 66 mg/dl at 62:9521:57 last night and 63 mg/dl at midnight. Please consider discontinuing Lantus at this time and just use Novolog correction for glycemic control. Will continue to follow and make further recommendations regarding basal insulin as more data is collected.  Correction (SSI): Currently ordered Novolog 0-9 units TID with meals. Patient is intubated on ventilator and NPO. Please change frequency of CBGs and Novolog to Q4H.  A1C: 6.6% on 10/11/15. Noted consult for DM and insulin teaching. According to H&P, patient was on insulin and stopped taking it a few months ago. A1C of 6.6% indicates good glycemic control over the past 2-3 months. Diabetes Coordinator will talk with patient when appropriate.  Thanks, Orlando PennerMarie Huck Ashworth, RN, MSN, CDE Diabetes Coordinator Inpatient Diabetes Program [phone] (Team Pager from 8am to 5pm) [phone] ([facility] office) [phone] [facility] office) [phone] [facility] office)

## 2015-10-17 NOTE — Progress Notes (Signed)
Pt's CBG trending down, given d50 per protocol, went from 66-72 then back down to 63. Pt started on D5NS per DR. Diamond.

## 2015-10-17 NOTE — Progress Notes (Signed)
Patient: Karen PellegriniCamille Y Jaffee / Admit Date: 10/10/2015 / Date of Encounter: 10/17/2015, 9:35 AM   Subjective: Intubated and sedate. Minus 3.8L for the past 24 hours on IV Lasix pushes with metolazone on 11/8. Improving SCr.   Review of Systems: Review of Systems  Unable to perform ROS: intubated    Objective: Telemetry: NSR Physical Exam: Blood pressure 137/75, pulse 62, temperature 94.2 F (34.6 C), temperature source Oral, resp. rate 14, height 5' 3.5" (1.613 m), weight 291 lb 3.6 oz (132.1 kg), last menstrual period 09/08/2015, SpO2 100 %. Body mass index is 50.77 kg/(m^2). General: Well developed, well nourished, in no acute distress. Head: Normocephalic, atraumatic, sclera non-icteric, no xanthomas, nares are without discharge. Neck: Negative for carotid bruits. JVP not elevated. Lungs: Clear bilaterally to auscultation without wheezes, rales, or rhonchi. Breathing is unlabored. Heart: RRR S1 S2 without murmurs, rubs, or gallops.  Abdomen: Non-tender, distended with normoactive bowel sounds. No rebound/guarding. Extremities: No clubbing or cyanosis. 2+ pitting edema throughout all extremities.  Neuro: Intubated and sedated. Psych:  Intubated and sedated.   Intake/Output Summary (Last 24 hours) at 10/17/15 0935 Last data filed at 10/17/15 0800  Gross per 24 hour  Intake 654.36 ml  Output   4327 ml  Net -3672.64 ml    Inpatient Medications:  . antiseptic oral rinse  7 mL Mouth Rinse QID  . carvedilol  12.5 mg Oral BID WC  . chlorhexidine gluconate  15 mL Mouth Rinse BID  . collagenase   Topical Daily  . enoxaparin (LOVENOX) injection  40 mg Subcutaneous Q24H  . famotidine (PEPCID) IV  20 mg Intravenous Q12H  . furosemide  80 mg Intravenous Q6H  . hydrALAZINE  37.5 mg Oral 3 times per day  . insulin aspart  0-9 Units Subcutaneous 6 times per day  . isosorbide dinitrate  10 mg Oral TID  . metolazone  5 mg Oral Daily  . sodium chloride  10-40 mL Intracatheter Q12H    Infusions:  . dextrose 5 % and 0.9% NaCl 10 mL/hr at 10/17/15 0334  . propofol (DIPRIVAN) infusion Stopped (10/17/15 0742)    Labs:  Recent Labs  10/15/15 2002 10/16/15 0948 10/17/15 0230  NA 135 140  139 138  K 4.5 4.1  4.1 3.9  CL 106 110  110 112*  CO2 22 24  23 23   GLUCOSE 248* 132*  134* 78  BUN 50* 52*  52* 52*  CREATININE 3.63* 3.82*  3.80* 3.70*  CALCIUM 8.8* 8.8*  8.9 8.4*  MG 2.1  --  1.9  PHOS  --  5.2* 5.2*    Recent Labs  10/15/15 2002 10/16/15 0948 10/17/15 0230  AST 31  --   --   ALT 31  --   --   ALKPHOS 102  --   --   BILITOT 0.4  --   --   PROT 5.7*  --   --   ALBUMIN 2.0* 1.9* 1.8*    Recent Labs  10/16/15 0948 10/17/15 0230  WBC 4.7 3.9  HGB 7.9* 8.4*  HCT 25.2* 26.2*  MCV 87.7 86.7  PLT 298 297   No results for input(s): CKTOTAL, CKMB, TROPONINI in the last 72 hours. Invalid input(s): POCBNP No results for input(s): HGBA1C in the last 72 hours.   Weights: Filed Weights   10/15/15 0653 10/16/15 0425 10/17/15 0343  Weight: 235 lb 6.4 oz (106.777 kg) 295 lb 10.2 oz (134.1 kg) 291 lb 3.6 oz (132.1 kg)  Radiology/Studies:  Dg Chest 2 View  10/10/2015  CLINICAL DATA:  Short of breath and edema EXAM: CHEST  2 VIEW COMPARISON:  07/10/2013 FINDINGS: Lungs are severely under aerated. Small pleural effusions are noted on the lateral view. There is increased opacity towards the lung bases favored represent volume loss. Developing airspace disease is not excluded. Pulmonary vasculature is within normal limits. IMPRESSION: Low lung volumes. Small bilateral pleural effusions Bibasilar volume loss. Electronically Signed   By: Jolaine Click M.D.   On: 10/10/2015 15:46   US Renal  10/10/2015  CLINICAL DATA:  Acute renal failure. EXAM: RENAL / URINARY TRACT ULTRASOUND COMPLETE COMPARISON:  07/10/2013 chest CT. FINDINGS: Right Kidney: Length: 9.3 cm. Echogenic right kidney with no right hydronephrosis and no right renal mass. No right  renal cortical atrophy. Left Kidney: Length: 10.7 cm. Echogenic left kidney with no left hydronephrosis and no left renal mass. No left renal cortical atrophy. Incidentally noted is a small left pleural effusion. Bladder: Appears normal for degree of bladder distention. IMPRESSION: 1. No hydronephrosis. 2. Echogenic normal size kidneys, indicating nonspecific renal parenchymal disease of uncertain chronicity. 3. Incidental small left pleural effusion. 4. Normal bladder. Electronically Signed   By: Delbert Phenix M.D.   On: 10/10/2015 18:59   Dg Chest Port 1 View  10/16/2015  CLINICAL DATA:  Respiratory failure.  Intubated. EXAM: PORTABLE CHEST 1 VIEW COMPARISON:  10/15/2015 FINDINGS: Endotracheal tube remains near the carina with the tip approximately 14 mm above the carina. There is cardiomegaly. Moderate layering right pleural effusion. Bilateral vascular congestion and airspace opacities could reflect edema. Mild cardiomegaly. Low lung volumes. IMPRESSION: Endotracheal tube just above the carina approximately 14 mm. Layering right effusion. Bilateral airspace opacities likely reflects edema. Low volumes with cardiomegaly. Electronically Signed   By: Charlett Nose M.D.   On: 10/16/2015 11:55   Dg Chest Port 1 View  10/15/2015  CLINICAL DATA:  Status post intubation. EXAM: PORTABLE CHEST 1 VIEW COMPARISON:  Earlier film, same date. FINDINGS: External pacer paddles are noted. The right IJ center venous catheter tip is in the right atrium and could be retracted 2 cm. The endotracheal tube is right at the carina. Very low lung volumes with vascular crowding atelectasis. Stable cardiac enlargement and pulmonary edema. IMPRESSION: 1. The endotracheal tube is right at the carina and should be retracted approximately 2-3 cm. 2. Right IJ central venous catheter tip is in the right atrium. This could be retracted 2-3 cm. 3. Low lung volumes with vascular crowding, atelectasis and persistent mild interstitial edema.  Electronically Signed   By: Rudie Meyer M.D.   On: 10/15/2015 19:26   Dg Chest Port 1 View  10/15/2015  CLINICAL DATA:  Central line placement.  Readjustment. EXAM: PORTABLE CHEST 1 VIEW COMPARISON:  10/15/2015 FINDINGS: Right central line tip is near the cavoatrial junction. Decreasing lung volumes with low lung volumes increasing bilateral lower lobe opacities. Cardiomegaly. No pneumothorax. Mild vascular congestion. IMPRESSION: Retraction of the right central line with the tip now near the cavoatrial junction. Cardiomegaly, vascular congestion. Decreasing lung volumes with bibasilar atelectasis or infiltrates. Electronically Signed   By: Charlett Nose M.D.   On: 10/15/2015 14:46   Dg Chest Port 1 View  10/15/2015  CLINICAL DATA:  Central line placement EXAM: PORTABLE CHEST 1 VIEW COMPARISON:  10/10/2015 FINDINGS: Enlarged cardiac silhouette. Hazy attenuation in the perihilar region bilaterally suggesting the possibility of pulmonary edema. Right internal jugular central line identified with tip extending about 3 cm into  the right atrium. No pneumothorax. IMPRESSION: No pneumothorax.  Central line extends into right atrium. Electronically Signed   By: Esperanza Heir M.D.   On: 10/15/2015 13:51     Assessment and Plan   1. PEA arrest/acute respiratory failure: -Patient transferred to ICU on the evening of 11/7 after suffering PEA arrest -CPR was initiated and meds delivered. No shocks. Question of Vfib, though strips appear to show artifact and regular WCT with possible bundle -She has previously been scheduled for stress testing, though did not show for this -She would benefit from ischemic evaluation, however given her renal impairment with CKD stage IV cardiac cath is somewhat precluded at this time without strong indication -Would start with [facility] when she is more stable to evaluate for high risk ischemia   2. 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 Acute on CKD stage IV: -No indication for dialysis at this time per Renal, see above -With proteinuria (nephrotic range 4.9 grams), felt to be 2/2 DM, possibly in the setting of poorly controlled CHF and HTN as well -Renally dose medications -Avoid nephrotoxins  4. Uncontrolled diabetes, type 2: -Per IM  5. HTN with associated renal failure and heart failure, uncontrolled: -Continue Coreg, isosorbide dinitrate, hydralazine, and diuretics    Signed, Eula Listen, PA-C Pager: [phone] 10/17/2015, 9:35 AM    Attending Note Patient seen and examined, agree with detailed note above,  Patient presentation and plan discussed on rounds.   CVP continues to run high, around 21 Good diuresis with Lasix IV And metolazone, greater than 3 L negative Still with significant leg, arm edema,  on ventilator Stable renal function, elevated creatinine 3.5 No significant arrhythmia overnight Case discussed with nephrology --Would agree, continue current course with aggressive diuresis Ischemia workup at a later date   Signed: Dossie Arbour  M.D., Ph.D. [facility]

## 2015-10-18 ENCOUNTER — Inpatient Hospital Stay: Payer: Medicaid Other

## 2015-10-18 LAB — GLUCOSE, CAPILLARY
GLUCOSE-CAPILLARY: 85 mg/dL (ref 65–99)
GLUCOSE-CAPILLARY: 90 mg/dL (ref 65–99)
Glucose-Capillary: 75 mg/dL (ref 65–99)
Glucose-Capillary: 105 mg/dL — ABNORMAL HIGH (ref 65–99)
Glucose-Capillary: 121 mg/dL — ABNORMAL HIGH (ref 65–99)
Glucose-Capillary: 85 mg/dL (ref 65–99)

## 2015-10-18 LAB — CBC
HEMATOCRIT: 25.6 % — AB (ref 35.0–47.0)
HEMOGLOBIN: 8.4 g/dL — AB (ref 12.0–16.0)
MCH: 28.3 pg (ref 26.0–34.0)
MCHC: 32.8 g/dL (ref 32.0–36.0)
MCV: 86.3 fL (ref 80.0–100.0)
PLATELETS: 296 10*3/uL (ref 150–440)
RBC: 2.96 MIL/uL — AB (ref 3.80–5.20)
RDW: 16.4 % — ABNORMAL HIGH (ref 11.5–14.5)
WBC: 8.8 10*3/uL (ref 3.6–11.0)

## 2015-10-18 LAB — BLOOD GAS, ARTERIAL
ACID-BASE DEFICIT: 1.5 mmol/L (ref 0.0–2.0)
ACID-BASE DEFICIT: 0.4 mmol/L (ref 0.0–2.0)
Allens test (pass/fail): POSITIVE — AB
Allens test (pass/fail): POSITIVE — AB
BICARBONATE: 22.7 meq/L (ref 21.0–28.0)
Bicarbonate: 24.1 mEq/L (ref 21.0–28.0)
FIO2: 0.28
FIO2: 0.28
Mode: POSITIVE
O2 SAT: 98.6 %
O2 Saturation: 97.4 %
PATIENT TEMPERATURE: 37
PCO2 ART: 35 mmHg (ref 32.0–48.0)
PCO2 ART: 38 mmHg (ref 32.0–48.0)
PEEP: 5 cmH2O
PH ART: 7.42 (ref 7.350–7.450)
PO2 ART: 116 mmHg — AB (ref 83.0–108.0)
Patient temperature: 37
Pressure support: 5 cmH2O
pH, Arterial: 7.41 (ref 7.350–7.450)
pO2, Arterial: 94 mmHg (ref 83.0–108.0)

## 2015-10-18 LAB — BASIC METABOLIC PANEL
ANION GAP: 10 (ref 5–15)
BUN: 52 mg/dL — ABNORMAL HIGH (ref 6–20)
CHLORIDE: 106 mmol/L (ref 101–111)
CO2: 22 mmol/L (ref 22–32)
Calcium: 8.7 mg/dL — ABNORMAL LOW (ref 8.9–10.3)
Creatinine, Ser: 3.88 mg/dL — ABNORMAL HIGH (ref 0.44–1.00)
GFR calc Af Amer: 16 mL/min — ABNORMAL LOW (ref >60)
GFR, EST NON AFRICAN AMERICAN: 13 mL/min — AB (ref >60)
GLUCOSE: 97 mg/dL (ref 65–99)
POTASSIUM: 3.5 mmol/L (ref 3.5–5.1)
Sodium: 138 mmol/L (ref 135–145)

## 2015-10-18 LAB — C DIFFICILE QUICK SCREEN W PCR REFLEX
C Diff antigen: NEGATIVE
C Diff interpretation: NEGATIVE
C Diff toxin: NEGATIVE

## 2015-10-18 MED ORDER — FAMOTIDINE 20 MG PO TABS
20.0000 mg | ORAL_TABLET | Freq: Every day | ORAL | Status: DC
  Administered 2015-10-19 – 2015-10-20 (×2): 20 mg via ORAL
  Filled 2015-10-18 (×2): qty 1

## 2015-10-18 NOTE — Progress Notes (Signed)
Spoke with Dr Dema SeverinMungal patient is increasingly more lethargic then she was early afternoon. Will open eyes with sternal rubs. ABG ordered

## 2015-10-18 NOTE — Progress Notes (Signed)
Nutrition Follow-up  INTERVENTION:   Meals and Snacks: await diet progression post extubation  NUTRITION DIAGNOSIS:   Inadequate oral intake related to acute illness as evidenced by NPO status.  GOAL:   Provide needs based on ASPEN/SCCM guidelines  MONITOR:    (Energy Intake, Anthropometrics, Electrolyte/Renal Profile)  REASON FOR ASSESSMENT:   Diagnosis    ASSESSMENT:    Pt s/p extubation this afternoon, currently on 2L [place]; pt has been receiving IV dextrose for hypoglycemia  Diet Order:  Diet NPO time specified   Electrolyte and Renal Profile:  Recent Labs Lab 10/15/15 2002 10/16/15 0948 10/17/15 0230 10/18/15 0400  BUN 50* 52*  52* 52* 52*  CREATININE 3.63* 3.82*  3.80* 3.70* 3.88*  NA 135 140  139 138 138  K 4.5 4.1  4.1 3.9 3.5  MG 2.1  --  1.9  --   PHOS  --  5.2* 5.2*  --    Glucose Profile:  Recent Labs  10/18/15 0029 10/18/15 0728 10/18/15 1136  GLUCAP 75 105* 121*   Meds: lasix, ss novolog  Height:   Ht Readings from Last 1 Encounters:  10/10/15 5' 3.5" (1.613 m)    Weight:   Wt Readings from Last 1 Encounters:  10/18/15 277 lb 9 oz (125.9 kg)   BMI:  Body mass index is 48.39 kg/(m^2).  Estimated Nutritional Needs:   Kcal:  1210-1375 kcals using IBW 55 kg (22-25 kcals/kg) per ASPEN guidelines  Protein:  138 g (2.5 g/kg)   Fluid:  1375-1650 mL (25-30 ml/kg)   EDUCATION NEEDS:   Education needs addressed  HIGH Care Level  Romelle Starcherate Corderius Saraceni MS, RD, LDN [phone] Pager

## 2015-10-18 NOTE — Progress Notes (Signed)
[facility] [place] at [facility]   PATIENT NAME: Karen Rich    MR#:  [id number]  DATE OF BIRTH:  [birth year]  SUBJECTIVE:  Patient seen earlier today  Remains intubated on vent. Off sedation.  REVIEW OF SYSTEMS:   Review of Systems  Unable to perform ROS: intubated   DRUG ALLERGIES:  No Known Allergies VITALS:  Blood pressure 126/61, pulse 76, temperature 96.6 F (35.9 C), temperature source Oral, resp. rate 23, height 5' 3.5" (1.613 m), weight 125.9 kg (277 lb 9 oz), last menstrual period 09/08/2015, SpO2 100 %. PHYSICAL EXAMINATION:   Physical Exam  Constitutional: She is well-developed, well-nourished, and in no distress. No distress.  HENT:  Head: Normocephalic.  Intubated on vent  Eyes: No scleral icterus.  Neck: No JVD present. No tracheal deviation present.  Cardiovascular: Exam reveals no gallop and no friction rub.   No murmur heard. Pulmonary/Chest: She has rales.  Abdominal: There is no tenderness.  Musculoskeletal: She exhibits edema.  Patient has anasarca  Neurological:  Intubated on vent - follows simple commands  Skin: Skin is warm. No rash noted. No erythema.  Psychiatric:  Unable to evaluate - being on vent.  Follows simple commands  Nursing note and vitals reviewed.  LABORATORY PANEL:   CBC  Recent Labs Lab 10/18/15 0400  WBC 8.8  HGB 8.4*  HCT 25.6*  PLT 296   ------------------------------------------------------------------------------------------------------------------  Chemistries   Recent Labs Lab 10/15/15 2002  10/17/15 0230 10/18/15 0400  NA 135  < > 138 138  K 4.5  < > 3.9 3.5  CL 106  < > 112* 106  CO2 22  < > 23 22  GLUCOSE 248*  < > 78 97  BUN 50*  < > 52* 52*  CREATININE 3.63*  < > 3.70* 3.88*  CALCIUM 8.8*  < > 8.4* 8.7*  MG 2.1  --  1.9  --   AST 31  --   --   --   ALT 31  --   --   --   ALKPHOS 102  --   --   --   BILITOT 0.4  --   --   --   < > = values in this interval  not displayed. ------------------------------------------------------------------------------------------------------------------  Cardiac Enzymes No results for input(s): TROPONINI in the last 168 hours. ------------------------------------------------------------------------------------------------------------------  RADIOLOGY:  No results found. ASSESSMENT AND PLAN:  39 year old female with a history of type 2 diabetes, hyperlipidemia and chronic kidney disease stage III presents to the ER with ascites and acute on chronic renal failure. More her to intensive care unit after CODE BLUE with asystole and got intubated  * Acute hypoxic resp failure: on vent (day 3), off sedation since 8 am and follows simple commands, nods head, vent mgmt per Intensivist - on SBT today Extubation if able later  * PEA Arrest: had CODE BLUE on 11/7, V. Tach> bradycardia> asystole, 2 rounds of ACLS and intubated 11/7  RIJ central line placed by Intensivist on 11/7  * Hypoglycemia with H/o Type 2 diabetes: Continue sliding scale insulin. Hold  Lantus. A1c is 6.6  * Acute on chronic combined systolic and diastolic CHF  On lasix 80 mg TID for now per nephro - off metolazone  * Acute on CKD 4: nephro following. 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 Avoid nephrotoxins and close monitoring. Creat stable  * Generalized anasarca: This is likely secondary to renal disease and acute combined diastolic and systolic heart failure. On  lasix TID  * HTN: continue Coreg, isosorbide dinitrate, hydralazine, and diuretics. Will adjust as need.  * Pressure ulcer present on admission right malleolus with skin breakdown to buttocks:  Wound care on board  * Anemia on Chronic Disease:  Monitor  CODE STATUS: FULL CODE  TOTAL CRITICAL CARE TIME TAKING CARE OF THIS PATIENT: 35 minutes.   D/W Harden MoStephanie Cantera and updated her about patient's condition   POSSIBLE D/C 3-4 days, DEPENDING ON CLINICAL CONDITION.   Milagros LollSudini,  Henchy Mccauley R M.D on 10/18/2015 at 2:15 PM  Between 7am to 6pm - Pager - [phone] After 6pm go to [email or web address] - password [email or web address]  Fabio Neighborsagle [place] [facility]  Office  [phone]  CC: Primary care physician; Sherrie MustacheFayegh Jadali, MD  Note: This dictation was prepared with Dragon dictation along with smaller phrase technology. Any transcriptional errors that result from this process are unintentional.

## 2015-10-18 NOTE — Progress Notes (Signed)
Patient suctioned prior to extubation, minimal secretions.  Patient extubated to 2LPM [place], tolerating well.  Will continue to monitor.  O2 sat on 2LPM [place] 98%

## 2015-10-18 NOTE — Progress Notes (Signed)
Called CT they stated they were doing emergent PE scans and unable to bring patient back

## 2015-10-18 NOTE — Progress Notes (Signed)
Patient extubated and placed on 2 liters. Oxygenation 98%

## 2015-10-18 NOTE — Consult Note (Signed)
PULMONARY / CRITICAL CARE MEDICINE   Name: Karen Rich MRN: [id number] DOB: 09/14/1976    ADMISSION DATE:  10/10/2015 CONSULTATION DATE:  10/16/15  REFERRING MD : Dr. Amado Coe   CHIEF COMPLAINT:     Cardiac arrest, respiratory failure   HISTORY OF PRESENT ILLNESS - per chart review   Patient admitted on 10/10/15 with the below history: 39 y.o. female with a known history of diabetes type 2, hyperlipidemia, hypertension, chronic kidney disease stage III who presents to the ED with complaint of swelling of her body diffusely. She reports that she stop taking all her medication including her insulin a few months ago. She also reports that she's been having shortness of breath ongoing for the past few months and is unable to lay flat and has to sleep in a chair. She denies any chest pain fevers or chills. Denies any nausea vomiting or diarrhea  On 10/15/15 the following events occurred Code blue was called as pt was c/o chest pain , gasping and became unresponsive . Patient was found to be bradycardic and eventually asystole-ACLS protocol was implemented for PEA by the ED physician. Once patient's rhythm was back she was intubated and moved to intensive care unit. Her rhythm strips were suggestive of ventricular fibrillation, however patient was able to move her eyes also moving her RUE to verbal commands, and hypothermia protocol was not initiated.   SUBJECTIVE: Patient more interactive today, on pressure support mode, sedation turned off, following simple commands, but very somnolent, however easily arousable.  SIGNIFICANT EVENTS    11/7>> CODE BLUE, V. Tach> bradycardia> asystole, 2 rounds of ACLS and intubated 11/7>> RIJ CVL   PAST MEDICAL HISTORY    :  Past Medical History  Diagnosis Date  . Edema   . Hyperlipidemia   . Hyperglycemia   . Fatigue   . Irregular heart beat   . Hypertension   . Diabetes mellitus without complication (HCC)   . Renal disorder   . Renal failure      stage 3   Past Surgical History  Procedure Laterality Date  . None     Prior to Admission medications   Not on File   No Known Allergies   FAMILY HISTORY   Family History  Problem Relation Age of Onset  . Hypertension Mother   . Diabetes Mother   . Hypertension Father   . CVA Father       SOCIAL HISTORY    reports that she has never smoked. She does not have any smokeless tobacco history on file. She reports that she does not drink alcohol or use illicit drugs.  Review of Systems  Unable to perform ROS: intubated      VITAL SIGNS    Temp:  [96.6 F (35.9 C)-99 F (37.2 C)] 96.6 F (35.9 C) (11/10 0700) Pulse Rate:  [66-75] 69 (11/10 0800) Resp:  [14-29] 21 (11/10 0800) BP: (86-162)/(61-92) 157/75 mmHg (11/10 0800) SpO2:  [98 %-100 %] 100 % (11/10 0800) FiO2 (%):  [28 %] 28 % (11/10 0856) Weight:  [277 lb 9 oz (125.9 kg)] 277 lb 9 oz (125.9 kg) (11/10 0500) HEMODYNAMICS: CVP:  [17 mmHg-33 mmHg] 26 mmHg VENTILATOR SETTINGS: Vent Mode:  [-] PRVC FiO2 (%):  [28 %] 28 % Set Rate:  [14 bmp] 14 bmp Vt Set:  [500 mL] 500 mL PEEP:  [5 cmH20] 5 cmH20 Pressure Support:  [0 cmH20] 0 cmH20 Plateau Pressure:  [27 cmH20] 27 cmH20 INTAKE / OUTPUT:  Intake/Output  Summary (Last 24 hours) at 10/18/15 0940 Last data filed at 10/18/15 0600  Gross per 24 hour  Intake 378.48 ml  Output   3875 ml  Net -3496.52 ml       PHYSICAL EXAM   Physical Exam  Constitutional: She appears well-developed.  HENT:  Head: Normocephalic.  Right Ear: External ear normal.  Left Ear: External ear normal.  Neck: Neck supple. No thyromegaly present.  Cardiovascular: Normal rate, regular rhythm, normal heart sounds and intact distal pulses.   No murmur heard. Pulmonary/Chest: She has no wheezes.  Intubated on ventilator, mild coarse upper airway sounds  Abdominal: Soft. Bowel sounds are normal. She exhibits no distension.  Musculoskeletal:  Edema to mid legs Generalized  anasarca  Neurological:  Intubated and sedated  Skin: Skin is warm and dry.       LABS   LABS:  CBC  Recent Labs Lab 10/16/15 0948 10/17/15 0230 10/18/15 0400  WBC 4.7 3.9 8.8  HGB 7.9* 8.4* 8.4*  HCT 25.2* 26.2* 25.6*  PLT 298 297 296   Coag's No results for input(s): APTT, INR in the last 168 hours. BMET  Recent Labs Lab 10/16/15 0948 10/17/15 0230 10/18/15 0400  NA 140  139 138 138  K 4.1  4.1 3.9 3.5  CL 110  110 112* 106  CO2 24  23 23 22   BUN 52*  52* 52* 52*  CREATININE 3.82*  3.80* 3.70* 3.88*  GLUCOSE 132*  134* 78 97   Electrolytes  Recent Labs Lab 10/15/15 2002 10/16/15 0948 10/17/15 0230 10/18/15 0400  CALCIUM 8.8* 8.8*  8.9 8.4* 8.7*  MG 2.1  --  1.9  --   PHOS  --  5.2* 5.2*  --    Sepsis Markers No results for input(s): LATICACIDVEN, PROCALCITON, O2SATVEN in the last 168 hours. ABG  Recent Labs Lab 10/15/15 2146 10/16/15 1145  PHART 7.30* 7.39  PCO2ART 43 35  PO2ART 85 152*   Liver Enzymes  Recent Labs Lab 10/15/15 2002 10/16/15 0948 10/17/15 0230  AST 31  --   --   ALT 31  --   --   ALKPHOS 102  --   --   BILITOT 0.4  --   --   ALBUMIN 2.0* 1.9* 1.8*   Cardiac Enzymes No results for input(s): TROPONINI, PROBNP in the last 168 hours. Glucose  Recent Labs Lab 10/17/15 1555 10/17/15 1741 10/17/15 1954 10/17/15 2117 10/18/15 0029 10/18/15 0728  GLUCAP 56* 77 68 89 75 105*     Recent Results (from the past 240 hour(s))  C difficile quick scan w PCR reflex     Status: None   Collection Time: 10/10/15 10:21 PM  Result Value Ref Range Status   C Diff antigen NEGATIVE NEGATIVE Final   C Diff toxin NEGATIVE NEGATIVE Final   C Diff interpretation Negative for C. difficile  Final  C difficile quick scan w PCR reflex     Status: None   Collection Time: 10/12/15 10:35 AM  Result Value Ref Range Status   C Diff antigen NEGATIVE NEGATIVE Final   C Diff toxin NEGATIVE NEGATIVE Final   C Diff  interpretation Negative for C. difficile  Final  MRSA PCR Screening     Status: None   Collection Time: 10/17/15  9:02 AM  Result Value Ref Range Status   MRSA by PCR NEGATIVE NEGATIVE Final    Comment:        The GeneXpert MRSA Assay (FDA approved for  NASAL specimens only), is one component of a comprehensive MRSA colonization surveillance program. It is not intended to diagnose MRSA infection nor to guide or monitor treatment for MRSA infections.   C difficile quick scan w PCR reflex     Status: None   Collection Time: 10/18/15  5:50 AM  Result Value Ref Range Status   C Diff antigen NEGATIVE NEGATIVE Final   C Diff toxin NEGATIVE NEGATIVE Final   C Diff interpretation Negative for C. difficile  Final     Current facility-administered medications:  .  0.9 %  sodium chloride infusion, 250 mL, Intravenous, PRN, Auburn Bilberry, MD .  acetaminophen (TYLENOL) tablet 650 mg, 650 mg, Oral, Q6H PRN **OR** acetaminophen (TYLENOL) suppository 650 mg, 650 mg, Rectal, Q6H PRN, Auburn Bilberry, MD .  albuterol (PROVENTIL) (2.5 MG/3ML) 0.083% nebulizer solution 2.5 mg, 2.5 mg, Nebulization, Q2H PRN, Lupita Leash, MD .  antiseptic oral rinse solution (CORINZ), 7 mL, Mouth Rinse, QID, Garlon Tuggle, MD, 7 mL at 10/18/15 0421 .  carvedilol (COREG) tablet 12.5 mg, 12.5 mg, Oral, BID WC, Auburn Bilberry, MD, 12.5 mg at 10/18/15 0806 .  chlorhexidine gluconate (PERIDEX) 0.12 % solution 15 mL, 15 mL, Mouth Rinse, BID, Jannelly Bergren, MD, 15 mL at 10/18/15 0806 .  collagenase (SANTYL) ointment, , Topical, Daily, Sital Mody, MD .  dextrose 5 %-0.9 % sodium chloride infusion, , Intravenous, Continuous, Arnaldo Natal, MD, Last Rate: 10 mL/hr at 10/17/15 0700 .  enoxaparin (LOVENOX) injection 40 mg, 40 mg, Subcutaneous, Q24H, Lucia Mccreadie, MD, 40 mg at 10/17/15 2017 .  famotidine (PEPCID) IVPB 20 mg premix, 20 mg, Intravenous, Q12H, Lupita Leash, MD, 20 mg at 10/17/15 2122 .  fentaNYL  (SUBLIMAZE) injection 100 mcg, 100 mcg, Intravenous, Q15 min PRN, Lupita Leash, MD .  fentaNYL (SUBLIMAZE) injection 100 mcg, 100 mcg, Intravenous, Q2H PRN, Lupita Leash, MD .  furosemide (LASIX) injection 80 mg, 80 mg, Intravenous, Q6H, Lamont Dowdy, MD, 80 mg at 10/18/15 0421 .  hydrALAZINE (APRESOLINE) injection 10 mg, 10 mg, Intravenous, Q6H PRN, Auburn Bilberry, MD .  hydrALAZINE (APRESOLINE) tablet 37.5 mg, 37.5 mg, Oral, 3 times per day, Tonny Bollman, MD, 37.5 mg at 10/18/15 0541 .  insulin aspart (novoLOG) injection 0-9 Units, 0-9 Units, Subcutaneous, 6 times per day, Delfino Lovett, MD, 0 Units at 10/17/15 1200 .  isosorbide dinitrate (ISORDIL) tablet 10 mg, 10 mg, Oral, TID, Tonny Bollman, MD, 10 mg at 10/17/15 2122 .  loperamide (IMODIUM) capsule 2 mg, 2 mg, Oral, Q6H PRN, Adrian Saran, MD, 2 mg at 10/15/15 1035 .  metolazone (ZAROXOLYN) tablet 5 mg, 5 mg, Oral, Daily, Lamont Dowdy, MD, 5 mg at 10/17/15 1044 .  ondansetron (ZOFRAN) tablet 4 mg, 4 mg, Oral, Q6H PRN **OR** ondansetron (ZOFRAN) injection 4 mg, 4 mg, Intravenous, Q6H PRN, Auburn Bilberry, MD .  propofol (DIPRIVAN) 1000 MG/100ML infusion, 0-50 mcg/kg/min, Intravenous, Continuous, Lupita Leash, MD, Stopped at 10/18/15 0600 .  sodium chloride 0.9 % injection 10-40 mL, 10-40 mL, Intracatheter, Q12H, Adrian Saran, MD, 10 mL at 10/17/15 2123 .  sodium chloride 0.9 % injection 10-40 mL, 10-40 mL, Intracatheter, PRN, Adrian Saran, MD .  sodium chloride 0.9 % injection 3 mL, 3 mL, Intravenous, PRN, Auburn Bilberry, MD, 3 mL at 10/11/15 0523  IMAGING    No results found.    Indwelling Urinary Catheter continued, requirement due to   Reason to continue Indwelling Urinary Catheter for strict Intake/Output monitoring for hemodynamic instability  Central Line continued, requirement due to   Reason to continue [employer] of central venous pressure or other hemodynamic parameters   Ventilator continued,  requirement due to, resp failure    Ventilator Sedation RASS 0 to -2   Cultures: BCx2  UC  Sputum  Antibiotics:  Lines: RIJ CVL 11/7>>  ASSESSMENT/PLAN  39 year old female admitted for anasarca, CKD stage II, had a cardiac arrest on the medical floor, transferred to the medical ICU after 2 rounds of ACLS, intubated and sedated.  PULMONARY Respiratory failure Cardiac arrest Requirement for mechanical ventilation New onset CHF (combined systolic and diastolic) P:   -Continue mechanical ventilation, wean as tolerated, ABGs as needed - hopefully will try to extubate today once more awake -Maintain O2 sat greater than 88% -Respiratory failure complicated by this new onset CHF -Planning for diuresis -Monitor creatinine levels during diuresis  CARDIOVASCULAR CVL RIJ A:  PEA arrest Combined CHF Anasarca Hypertension P:  -Now with stable cardiac rhythm, being evaluated by cardiology -Her EF is 40-45 percent -Cardiology actively following, has combined systolic and diastolic CHF, currently being treated with somewhat aggressive diuresis -Will require further outpatient cardiology workup -Diuresis - anasarca out of proportion to previous ECHO findings, concern for PE, not a CTA or V/Q candidate, will recheck ECHO and evaluate RV.   RENAL History of chronic kidney disease Elevated creatinine P:   -Monitor creatinine levels, continue with diuresis as tolerated -Appreciate nephrology recommendations -ICU electrolyte replacement protocol  GASTROINTESTINAL -GI prophylaxis  HEMATOLOGIC A:  Anemia P:  -Monitor blood counts, could be dilutional   ENDOCRINE Type 2 diabetes -Continue with SSI  NEUROLOGIC A:  Intubated and sedated P:   RASS goal: 0 -Had a short sedation vacation, was able to follow commands, but became increasingly agitated and this increased on the vent. Will continue with vent weaning protocol and sedation vacation    I have personally obtained  a history, examined the patient, evaluated laboratory and imaging results, formulated the assessment and plan and placed orders.  The Patient requires high complexity decision making for assessment and support, frequent evaluation and titration of therapies, application of advanced monitoring technologies and extensive interpretation of multiple databases. Critical Care Time devoted to patient care services described in this note is 45 minutes.   Overall, patient is critically ill, prognosis is guarded. Patient at high risk for cardiac arrest and death.   Stephanie Acre, MD [facility] Pager [phone] (please enter 7-digits) On Call Pager - [phone] (please enter 7-digits)     10/18/2015, 9:40 AM  Note: This note was prepared with Dragon dictation along with smaller phrase technology. Any transcriptional errors that result from this process are unintentional.

## 2015-10-18 NOTE — Progress Notes (Signed)
Patient: Karen Rich / Admit Date: 10/10/2015 / Date of Encounter: 10/18/2015, 8:50 AM   Subjective: Intubated and sedate. She continues to have good urine output.  on IV Lasix pushes with metolazone on 11/8. Stable renal function.   Review of Systems: Review of Systems  Unable to perform ROS: intubated    Objective: Telemetry: NSR Physical Exam: Blood pressure 157/75, pulse 69, temperature 96.6 F (35.9 C), temperature source Oral, resp. rate 21, height 5' 3.5" (1.613 m), weight 277 lb 9 oz (125.9 kg), last menstrual period 09/08/2015, SpO2 100 %. Body mass index is 48.39 kg/(m^2). General: Well developed, well nourished, in no acute distress. Head: Normocephalic, atraumatic, sclera non-icteric, no xanthomas, nares are without discharge. Neck: Negative for carotid bruits. JVP not elevated. Lungs: Clear bilaterally to auscultation without wheezes, rales, or rhonchi. Breathing is unlabored. Heart: RRR S1 S2 without murmurs, rubs, or gallops.  Abdomen: Non-tender, distended with normoactive bowel sounds. No rebound/guarding. Extremities: No clubbing or cyanosis. 2+ pitting edema throughout all extremities.  Neuro: Intubated and sedated. Psych:  Intubated and sedated.   Intake/Output Summary (Last 24 hours) at 10/18/15 0850 Last data filed at 10/18/15 0600  Gross per 24 hour  Intake 378.48 ml  Output   3875 ml  Net -3496.52 ml    Inpatient Medications:  . antiseptic oral rinse  7 mL Mouth Rinse QID  . carvedilol  12.5 mg Oral BID WC  . chlorhexidine gluconate  15 mL Mouth Rinse BID  . collagenase   Topical Daily  . enoxaparin (LOVENOX) injection  40 mg Subcutaneous Q24H  . famotidine (PEPCID) IV  20 mg Intravenous Q12H  . furosemide  80 mg Intravenous Q6H  . hydrALAZINE  37.5 mg Oral 3 times per day  . insulin aspart  0-9 Units Subcutaneous 6 times per day  . isosorbide dinitrate  10 mg Oral TID  . metolazone  5 mg Oral Daily  . sodium chloride  10-40 mL  Intracatheter Q12H   Infusions:  . dextrose 5 % and 0.9% NaCl 10 mL/hr at 10/17/15 0700  . propofol (DIPRIVAN) infusion Stopped (10/18/15 0600)    Labs:  Recent Labs  10/15/15 2002 10/16/15 0948 10/17/15 0230 10/18/15 0400  NA 135 140  139 138 138  K 4.5 4.1  4.1 3.9 3.5  CL 106 110  110 112* 106  CO2 GLUCOSE 248* 132*  134* 78 97  BUN 50* 52*  52* 52* 52*  CREATININE 3.63* 3.82*  3.80* 3.70* 3.88*  CALCIUM 8.8* 8.8*  8.9 8.4* 8.7*  MG 2.1  --  1.9  --   PHOS  --  5.2* 5.2*  --     Recent Labs  10/15/15 2002 10/16/15 0948 10/17/15 0230  AST 31  --   --   ALT 31  --   --   ALKPHOS 102  --   --   BILITOT 0.4  --   --   PROT 5.7*  --   --   ALBUMIN 2.0* 1.9* 1.8*    Recent Labs  10/17/15 0230 10/18/15 0400  WBC 3.9 8.8  HGB 8.4* 8.4*  HCT 26.2* 25.6*  MCV 86.7 86.3  PLT 297 296   No results for input(s): CKTOTAL, CKMB, TROPONINI in the last 72 hours. Invalid input(s): POCBNP No results for input(s): HGBA1C in the last 72 hours.   Weights: Filed Weights   10/16/15 0425 10/17/15 0343 10/18/15 0500  Weight: 295  lb 10.2 oz (134.1 kg) 291 lb 3.6 oz (132.1 kg) 277 lb 9 oz (125.9 kg)     Radiology/Studies:  Dg Chest 2 View  10/10/2015  CLINICAL DATA:  Short of breath and edema EXAM: CHEST  2 VIEW COMPARISON:  07/10/2013 FINDINGS: Lungs are severely under aerated. Small pleural effusions are noted on the lateral view. There is increased opacity towards the lung bases favored represent volume loss. Developing airspace disease is not excluded. Pulmonary vasculature is within normal limits. IMPRESSION: Low lung volumes. Small bilateral pleural effusions Bibasilar volume loss. Electronically Signed   By: Jolaine ClickArthur  Hoss M.D.   On: 10/10/2015 15:46   [place] Renal  10/10/2015  CLINICAL DATA:  Acute renal failure. EXAM: RENAL / URINARY TRACT ULTRASOUND COMPLETE COMPARISON:  07/10/2013 chest CT. FINDINGS: Right Kidney: Length: 9.3 cm. Echogenic right  kidney with no right hydronephrosis and no right renal mass. No right renal cortical atrophy. Left Kidney: Length: 10.7 cm. Echogenic left kidney with no left hydronephrosis and no left renal mass. No left renal cortical atrophy. Incidentally noted is a small left pleural effusion. Bladder: Appears normal for degree of bladder distention. IMPRESSION: 1. No hydronephrosis. 2. Echogenic normal size kidneys, indicating nonspecific renal parenchymal disease of uncertain chronicity. 3. Incidental small left pleural effusion. 4. Normal bladder. Electronically Signed   By: Delbert PhenixJason A Poff M.D.   On: 10/10/2015 18:59   Dg Chest Port 1 View  10/16/2015  CLINICAL DATA:  Respiratory failure.  Intubated. EXAM: PORTABLE CHEST 1 VIEW COMPARISON:  10/15/2015 FINDINGS: Endotracheal tube remains near the carina with the tip approximately 14 mm above the carina. There is cardiomegaly. Moderate layering right pleural effusion. Bilateral vascular congestion and airspace opacities could reflect edema. Mild cardiomegaly. Low lung volumes. IMPRESSION: Endotracheal tube just above the carina approximately 14 mm. Layering right effusion. Bilateral airspace opacities likely reflects edema. Low volumes with cardiomegaly. Electronically Signed   By: Charlett NoseKevin  Dover M.D.   On: 10/16/2015 11:55   Dg Chest Port 1 View  10/15/2015  CLINICAL DATA:  Status post intubation. EXAM: PORTABLE CHEST 1 VIEW COMPARISON:  Earlier film, same date. FINDINGS: External pacer paddles are noted. The right IJ center venous catheter tip is in the right atrium and could be retracted 2 cm. The endotracheal tube is right at the carina. Very low lung volumes with vascular crowding atelectasis. Stable cardiac enlargement and pulmonary edema. IMPRESSION: 1. The endotracheal tube is right at the carina and should be retracted approximately 2-3 cm. 2. Right IJ central venous catheter tip is in the right atrium. This could be retracted 2-3 cm. 3. Low lung volumes with  vascular crowding, atelectasis and persistent mild interstitial edema. Electronically Signed   By: Rudie MeyerP.  Gallerani M.D.   On: 10/15/2015 19:26   Dg Chest Port 1 View  10/15/2015  CLINICAL DATA:  Central line placement.  Readjustment. EXAM: PORTABLE CHEST 1 VIEW COMPARISON:  10/15/2015 FINDINGS: Right central line tip is near the cavoatrial junction. Decreasing lung volumes with low lung volumes increasing bilateral lower lobe opacities. Cardiomegaly. No pneumothorax. Mild vascular congestion. IMPRESSION: Retraction of the right central line with the tip now near the cavoatrial junction. Cardiomegaly, vascular congestion. Decreasing lung volumes with bibasilar atelectasis or infiltrates. Electronically Signed   By: Charlett NoseKevin  Dover M.D.   On: 10/15/2015 14:46   Dg Chest Port 1 View  10/15/2015  CLINICAL DATA:  Central line placement EXAM: PORTABLE CHEST 1 VIEW COMPARISON:  10/10/2015 FINDINGS: Enlarged cardiac silhouette. Hazy attenuation in the perihilar  region bilaterally suggesting the possibility of pulmonary edema. Right internal jugular central line identified with tip extending about 3 cm into the right atrium. No pneumothorax. IMPRESSION: No pneumothorax.  Central line extends into right atrium. Electronically Signed   By: Esperanza Heir M.D.   On: 10/15/2015 13:51     Assessment and Plan   1. PEA arrest/acute respiratory failure: -Patient transferred to ICU on the evening of 11/7 after suffering PEA arrest -CPR was initiated and meds delivered. No shocks. Question of Vfib, though strips appear to show artifact and regular WCT with possible bundle - continue supportive care.   2. Acute on chronic combined systolic and diastolic CHF NYHA Class IV with anasarca: - She is still fluid overloaded.  - continue IV Lasix and metolazone -CVP remains elevated around 21 -Her anasarca is out of proportion to her EF and right-sided pressure  - Ischemic cardiac evaluation once she recovers.   3. Acute on  CKD stage IV: -No indication for dialysis at this time per Renal, see above -With proteinuria (nephrotic range 4.9 grams), felt to be 2/2 DM, possibly in the setting of poorly controlled CHF and HTN as well -Renally dose medications -Avoid nephrotoxins  4. Uncontrolled diabetes, type 2: -Per IM  5. HTN with associated renal failure and heart failure, uncontrolled: -Continue Coreg, isosorbide dinitrate, hydralazine, and diuretics    Signed, Lorine Bears, MD.    10/18/2015, 8:50 AM

## 2015-10-18 NOTE — Progress Notes (Signed)
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  Sedated but shakes her head yes/no.  CURRENT MEDS . antiseptic oral rinse  7 mL Mouth Rinse QID  . carvedilol  12.5 mg Oral BID WC  . chlorhexidine gluconate  15 mL Mouth Rinse BID  . collagenase   Topical Daily  . enoxaparin (LOVENOX) injection  40 mg Subcutaneous Q24H  . famotidine (PEPCID) IV  20 mg Intravenous Q12H  . furosemide  80 mg Intravenous Q6H  . hydrALAZINE  37.5 mg Oral 3 times per day  . insulin aspart  0-9 Units Subcutaneous 6 times per day  . isosorbide dinitrate  10 mg Oral TID  . metolazone  5 mg Oral Daily  . sodium chloride  10-40 mL Intracatheter Q12H    OBJECTIVE  Filed Vitals:   10/18/15 0300 10/18/15 0400 10/18/15 0500 10/18/15 0600  BP: 134/92 126/61 122/61 133/67  Pulse: 69 68 67 66  Temp:  97.9 F (36.6 C)    TempSrc:      Resp: Height:      Weight:   277 lb 9 oz (125.9 kg)   SpO2: 100% 100% 100% 99%    Intake/Output Summary (Last 24 hours) at 10/18/15 0809 Last data filed at 10/18/15 0600  Gross per 24 hour  Intake 378.48 ml  Output   3875 ml  Net -3496.52 ml   Filed Weights   10/16/15 0425 10/17/15 0343 10/18/15 0500  Weight: 295 lb 10.2 oz (134.1 kg) 291 lb 3.6 oz (132.1 kg) 277 lb 9 oz (125.9 kg)    PHYSICAL EXAM  General: Pleasant, intubated.  Sedated but awake/groggy. Neuro: intubated.  Sedated but awake/groggy. Able to shake her head to yes/no questions. Psych: flat affect. HEENT:  Normal  Neck: Supple without bruits.  Obese, difficult to gauge JVP. Lungs:  Resp regular and unlabored, coarse breath sounds throughout. Heart: RRR,  distant, no s3, s4, or murmurs. Abdomen: Obese, edematous, BS+x4. Extremities: No clubbing, cyanosis.  1+ bilat woody edema. DP/PT/Radials 1+ and equal bilaterally.  Accessory Clinical Findings  CBC  Recent Labs  10/17/15 0230 10/18/15 0400  WBC 3.9 8.8  HGB 8.4* 8.4*  HCT 26.2* 25.6*  MCV 86.7 86.3  PLT 297 296   Basic Metabolic Panel  Recent Labs  10/15/15 2002 10/16/15 0948 10/17/15 0230 10/18/15 0400  NA 135 140  139 138 138  K 4.5 4.1  4.1 3.9 3.5  CL 106 110  110 112* 106  CO2 GLUCOSE 248* 132*  134* 78 97  BUN 50* 52*  52* 52* 52*  CREATININE 3.63* 3.82*  3.80* 3.70* 3.88*  CALCIUM 8.8* 8.8*  8.9 8.4* 8.7*  MG 2.1  --  1.9  --   PHOS  --  5.2* 5.2*  --    Liver Function Tests  Recent Labs  10/15/15 2002 10/16/15 0948 10/17/15 0230  AST 31  --   --   ALT 31  --   --   ALKPHOS 102  --   --   BILITOT 0.4  --   --   PROT 5.7*  --   --  ALBUMIN 2.0* 1.9* 1.8*   Fasting Lipid Panel  Recent Labs  10/15/15 2002  TRIG 896*   TELE  RSR  Radiology/Studies  Dg Chest 2 View  10/10/2015  CLINICAL DATA:  Short of breath and edema EXAM: CHEST  2 VIEW COMPARISON:  07/10/2013 FINDINGS: Lungs are severely under aerated. Small pleural effusions are noted on the lateral view. There is increased opacity towards the lung bases favored represent volume loss. Developing airspace disease is not excluded. Pulmonary vasculature is within normal limits. IMPRESSION: Low lung volumes. Small bilateral pleural effusions Bibasilar volume loss. Electronically Signed   By: Jolaine ClickArthur  Hoss M.D.   On: 10/10/2015 15:46   [place] Renal  10/10/2015  CLINICAL DATA:  Acute renal failure. EXAM: RENAL / URINARY TRACT ULTRASOUND COMPLETE COMPARISON:  07/10/2013 chest CT. FINDINGS: Right Kidney: Length: 9.3 cm. Echogenic right kidney with no right hydronephrosis and no right renal mass. No right renal cortical atrophy. Left Kidney: Length: 10.7 cm. Echogenic left kidney  with no left hydronephrosis and no left renal mass. No left renal cortical atrophy. Incidentally noted is a small left pleural effusion. Bladder: Appears normal for degree of bladder distention. IMPRESSION: 1. No hydronephrosis. 2. Echogenic normal size kidneys, indicating nonspecific renal parenchymal disease of uncertain chronicity. 3. Incidental small left pleural effusion. 4. Normal bladder. Electronically Signed   By: Delbert PhenixJason A Poff M.D.   On: 10/10/2015 18:59   Dg Chest Port 1 View  10/16/2015  CLINICAL DATA:  Respiratory failure.  Intubated. EXAM: PORTABLE CHEST 1 VIEW COMPARISON:  10/15/2015 FINDINGS: Endotracheal tube remains near the carina with the tip approximately 14 mm above the carina. There is cardiomegaly. Moderate layering right pleural effusion. Bilateral vascular congestion and airspace opacities could reflect edema. Mild cardiomegaly. Low lung volumes. IMPRESSION: Endotracheal tube just above the carina approximately 14 mm. Layering right effusion. Bilateral airspace opacities likely reflects edema. Low volumes with cardiomegaly. Electronically Signed   By: Charlett NoseKevin  Dover M.D.   On: 10/16/2015 11:55   Dg Chest Port 1 View  10/15/2015  CLINICAL DATA:  Status post intubation. EXAM: PORTABLE CHEST 1 VIEW COMPARISON:  Earlier film, same date. FINDINGS: External pacer paddles are noted. The right IJ center venous catheter tip is in the right atrium and could be retracted 2 cm. The endotracheal tube is right at the carina. Very low lung volumes with vascular crowding atelectasis. Stable cardiac enlargement and pulmonary edema. IMPRESSION: 1. The endotracheal tube is right at the carina and should be retracted approximately 2-3 cm. 2. Right IJ central venous catheter tip is in the right atrium. This could be retracted 2-3 cm. 3. Low lung volumes with vascular crowding, atelectasis and persistent mild interstitial edema. Electronically Signed   By: Rudie MeyerP.  Gallerani M.D.   On: 10/15/2015 19:26   Dg  Chest Port 1 View  10/15/2015  CLINICAL DATA:  Central line placement.  Readjustment. EXAM: PORTABLE CHEST 1 VIEW COMPARISON:  10/15/2015 FINDINGS: Right central line tip is near the cavoatrial junction. Decreasing lung volumes with low lung volumes increasing bilateral lower lobe opacities. Cardiomegaly. No pneumothorax. Mild vascular congestion. IMPRESSION: Retraction of the right central line with the tip now near the cavoatrial junction. Cardiomegaly, vascular congestion. Decreasing lung volumes with bibasilar atelectasis or infiltrates. Electronically Signed   By: Charlett NoseKevin  Dover M.D.   On: 10/15/2015 14:46   Dg Chest Port 1 View  10/15/2015  CLINICAL DATA:  Central line placement EXAM: PORTABLE CHEST 1 VIEW COMPARISON:  10/10/2015 FINDINGS: Enlarged cardiac silhouette. Hazy attenuation  in the perihilar region bilaterally suggesting the possibility of pulmonary edema. Right internal jugular central line identified with tip extending about 3 cm into the right atrium. No pneumothorax. IMPRESSION: No pneumothorax.  Central line extends into right atrium. Electronically Signed   By: Esperanza Heir M.D.   On: 10/15/2015 13:51    ASSESSMENT AND PLAN  1. PEA arrest/acute respiratory failure: -Patient transferred to ICU on the evening of 11/7 after suffering PEA arrest - etiology unclear.   -CPR was initiated and meds delivered. No shocks. Question of Vfib, though strips appear to show artifact and regular WCT with possible bundle -She has previously been scheduled for stress testing, though did not show for this -She would benefit from ischemic evaluation, however given her renal impairment with CKD stage IV cardiac cath is precluded at this time without strong indication. -Would start with [facility] when she is more stable to evaluate for high risk ischemia.  2. 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 -Minus 11.5 L this  admission, 3.9L overnight.  Wts are so variably recorded that they are useless. -CVP 26 last night. -Her anasarca is out of proportion to her EF and right-sided pressure.  In setting of PEA arrest, ? Possible PE (not currently a candidate for CTA or V:Q).  Echo 11/3 did not show PAH or RV failure (performed prior to PEA arrest). -Diuresing well.  Cont IV Lasix 80 mg q 8 hours per renal - fxn reasonably stable. -Metolazone was discontinued 11/9 by IM  -Monitor urine output -Replete K+ as needed -Monitor closely for arrhythmia  3. Acute on CKD stage IV: -No indication for dialysis at this time per Renal, see above -With proteinuria (nephrotic range 4.9 grams), felt to be 2/2 DM, possibly in the setting of poorly controlled CHF and HTN as well -Renally dose medications -Avoid nephrotoxins  4. Uncontrolled diabetes, type 2: -Per IM  5. HTN with associated renal failure and heart failure, uncontrolled: -Continue Coreg, isosorbide dinitrate, hydralazine, and diuretics   Signed, Nicolasa Ducking NP

## 2015-10-18 NOTE — Progress Notes (Signed)
Inpatient Diabetes Program Recommendations  AACE/ADA: New Consensus Statement on Inpatient Glycemic Control (2015)  Target Ranges:  Prepandial:   less than 140 mg/dL      Peak postprandial:   less than 180 mg/dL (1-2 hours)      Critically ill patients:  140 - 180 mg/dL   Review of Glycemic Control A1C 6.6%  Results for Karen PellegriniHAITH, Karen Rich (MRN [id number]) as of 10/18/2015 07:00  Ref. Range 10/17/2015 15:55 10/17/2015 17:41 10/17/2015 19:54 10/17/2015 21:17 10/18/2015 00:29  Glucose-Capillary Latest Ref Range: 65-99 mg/dL 56 (L) 77 68 89 75    Current orders for Inpatient glycemic control: Novolog 0-9 units q4h  Inpatient Diabetes Program Recommendations:  If patient is extubated today, please change Novolog 0-9 correction order q4h to tid and Novolog 0-5 units hs  Currently ordered Novolog 0-9 uniits q4h.   Will continue to follow and make further recommendations regarding basal insulin as more data is collected.

## 2015-10-18 NOTE — Progress Notes (Signed)
Remains intubated, sedation turned off at 0600 in preparation for SBT today. VSS family updated.

## 2015-10-18 NOTE — Progress Notes (Signed)
Subjective:   Remains critically ill. Intubated and sedated. Was placed on pressure support yesterday.  UOP  4375 (3880)  furosemide  IV q6 and metolazone  Objective:  Vital signs in last 24 hours:  Temp:  [96.6 F (35.9 C)-99 F (37.2 C)] 96.6 F (35.9 C) (11/10 0700) Pulse Rate:  [66-75] 69 (11/10 0800) Resp:  [14-29] 21 (11/10 0800) BP: (86-162)/(61-92) 157/75 mmHg (11/10 0800) SpO2:  [98 %-100 %] 100 % (11/10 0800) FiO2 (%):  [28 %] 28 % (11/10 0856) Weight:  [125.9 kg (277 lb 9 oz)] 125.9 kg (277 lb 9 oz) (11/10 0500)  Weight change: -6.2 kg (-13 lb 10.7 oz) Filed Weights   10/16/15 0425 10/17/15 0343 10/18/15 0500  Weight: 134.1 kg (295 lb 10.2 oz) 132.1 kg (291 lb 3.6 oz) 125.9 kg (277 lb 9 oz)    Intake/Output:    Intake/Output Summary (Last 24 hours) at 10/18/15 0905 Last data filed at 10/18/15 0600  Gross per 24 hour  Intake 378.48 ml  Output   3875 ml  Net -3496.52 ml     Physical Exam: General: Critically ill, +anasarca  HEENT ETT  Neck Right central line  Pulm/lungs Clear, vent assisted, PRVC 28%  CVS/Heart Regular rhythm  Abdomen:  +distended, +abdominal wall edema  Extremities: 2+ pitting edema all four extremities  Neurologic: Alert, oriented, ambulatory  Skin: excoriations          Basic Metabolic Panel:   Recent Labs Lab 10/15/15 0315 10/15/15 2002 10/16/15 0948 10/17/15 0230 10/18/15 0400  NA 141 135 140  139 138 138  K 4.3 4.5 4.1  4.1 3.9 3.5  CL 115* 106 110  110 112* 106  CO2 GLUCOSE 98 248* 132*  134* 78 97  BUN 50* 50* 52*  52* 52* 52*  CREATININE 3.76* 3.63* 3.82*  3.80* 3.70* 3.88*  CALCIUM 8.6* 8.8* 8.8*  8.9 8.4* 8.7*  MG  --  2.1  --  1.9  --   PHOS  --   --  5.2* 5.2*  --      CBC:  Recent Labs Lab 10/14/15 0258 10/15/15 2002 10/16/15 0948 10/17/15 0230 10/18/15 0400  WBC 5.8 7.0 4.7 3.9 8.8  HGB 8.5* 8.6* 7.9* 8.4* 8.4*  HCT 26.8* 25.4* 25.2* 26.2* 25.6*  MCV 88.2  88.6 87.7 86.7 86.3  PLT 322 287 298 297 296      Microbiology:  Recent Results (from the past 720 hour(s))  C difficile quick scan w PCR reflex     Status: None   Collection Time: 10/10/15 10:21 PM  Result Value Ref Range Status   C Diff antigen NEGATIVE NEGATIVE Final   C Diff toxin NEGATIVE NEGATIVE Final   C Diff interpretation Negative for C. difficile  Final  C difficile quick scan w PCR reflex     Status: None   Collection Time: 10/12/15 10:35 AM  Result Value Ref Range Status   C Diff antigen NEGATIVE NEGATIVE Final   C Diff toxin NEGATIVE NEGATIVE Final   C Diff interpretation Negative for C. difficile  Final  MRSA PCR Screening     Status: None   Collection Time: 10/17/15  9:02 AM  Result Value Ref Range Status   MRSA by PCR NEGATIVE NEGATIVE Final    Comment:        The GeneXpert MRSA Assay (FDA approved for NASAL specimens only), is one component of a comprehensive MRSA colonization surveillance  program. It is not intended to diagnose MRSA infection nor to guide or monitor treatment for MRSA infections.   C difficile quick scan w PCR reflex     Status: None   Collection Time: 10/18/15  5:50 AM  Result Value Ref Range Status   C Diff antigen NEGATIVE NEGATIVE Final   C Diff toxin NEGATIVE NEGATIVE Final   C Diff interpretation Negative for C. difficile  Final    Coagulation Studies: No results for input(s): LABPROT, INR in the last 72 hours.  Urinalysis: No results for input(s): COLORURINE, LABSPEC, PHURINE, GLUCOSEU, HGBUR, BILIRUBINUR, KETONESUR, PROTEINUR, UROBILINOGEN, NITRITE, LEUKOCYTESUR in the last 72 hours.  Invalid input(s): APPERANCEUR    Imaging: Dg Chest Port 1 View  10/16/2015  CLINICAL DATA:  Respiratory failure.  Intubated. EXAM: PORTABLE CHEST 1 VIEW COMPARISON:  10/15/2015 FINDINGS: Endotracheal tube remains near the carina with the tip approximately 14 mm above the carina. There is cardiomegaly. Moderate layering right pleural  effusion. Bilateral vascular congestion and airspace opacities could reflect edema. Mild cardiomegaly. Low lung volumes. IMPRESSION: Endotracheal tube just above the carina approximately 14 mm. Layering right effusion. Bilateral airspace opacities likely reflects edema. Low volumes with cardiomegaly. Electronically Signed   By: Charlett Nose M.D.   On: 10/16/2015 11:55     Medications:   . dextrose 5 % and 0.9% NaCl 10 mL/hr at 10/17/15 0700  . propofol (DIPRIVAN) infusion Stopped (10/18/15 0600)   . antiseptic oral rinse  7 mL Mouth Rinse QID  . carvedilol  12.5 mg Oral BID WC  . chlorhexidine gluconate  15 mL Mouth Rinse BID  . collagenase   Topical Daily  . enoxaparin (LOVENOX) injection  40 mg Subcutaneous Q24H  . famotidine (PEPCID) IV  20 mg Intravenous Q12H  . furosemide  80 mg Intravenous Q6H  . hydrALAZINE  37.5 mg Oral 3 times per day  . insulin aspart  0-9 Units Subcutaneous 6 times per day  . isosorbide dinitrate  10 mg Oral TID  . metolazone  5 mg Oral Daily  . sodium chloride  10-40 mL Intracatheter Q12H   sodium chloride, acetaminophen **OR** acetaminophen, albuterol, fentaNYL (SUBLIMAZE) injection, fentaNYL (SUBLIMAZE) injection, hydrALAZINE, loperamide, ondansetron **OR** ondansetron (ZOFRAN) IV, sodium chloride, sodium chloride  Assessment/ Plan:  39 y.o. 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  1. 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 Acute renal failure from acute cardiorenal syndrome from systolic acute congestive heart failure and now acute respiratory failure requiring mechanical ventilation. Echocardiogram from 11/3 with EF of 40-45%. Also with hypotension during cardiac  arrest.  Chronic kidney disease with proteinuria (nephrotic range: 4.9 grams) secondary to diabetic nephropathy. 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 Baseline creatinine of 1.2 in 08/2014. Unclear how much progression of disease. - Creatinine stable from last few days. UOP responsive to IV furosemide. Continue furosemide  IV q6 hours. Continue metolazone - monitor serum electrolytes. No acute indication for dialysis. If volume status and respiratory status do not improve with aggressive diuretic therapy, will proceed with dialysis. Will need dialysis access.  - Renally dose all medication - not a candidate for ACE-I/ARB at this time.   2. Acute exacerbation of systolic congestive heart failure with anasarca: currently with significant volume overload - furosemide and  metolazone as above.  - monitor urine output, volume status - Appreciate cardiology input  3. 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  - Continue insulin regimen and glucose control.   4. Hypertension: volume driven. Blood pressure better controlled.  - Current regimen of carvedilol, isosorbide dinitrate, hydralazine and diuretics.  Carvedilol as per cardiology.   Isosorbide dinitrate and hydralazine for afterload reduction.    LOS: 8 Karen Rich 11/10/20169:05 AM

## 2015-10-19 DIAGNOSIS — R404 Transient alteration of awareness: Secondary | ICD-10-CM

## 2015-10-19 LAB — COMPREHENSIVE METABOLIC PANEL WITH GFR
ALT: 29 U/L (ref 14–54)
AST: 23 U/L (ref 15–41)
Albumin: 1.8 g/dL — ABNORMAL LOW (ref 3.5–5.0)
Alkaline Phosphatase: 92 U/L (ref 38–126)
Anion gap: 6 (ref 5–15)
BUN: 53 mg/dL — ABNORMAL HIGH (ref 6–20)
CO2: 24 mmol/L (ref 22–32)
Calcium: 8.3 mg/dL — ABNORMAL LOW (ref 8.9–10.3)
Chloride: 109 mmol/L (ref 101–111)
Creatinine, Ser: 3.92 mg/dL — ABNORMAL HIGH (ref 0.44–1.00)
GFR calc Af Amer: 15 mL/min — ABNORMAL LOW (ref >60)
GFR calc non Af Amer: 13 mL/min — ABNORMAL LOW (ref >60)
Glucose, Bld: 83 mg/dL (ref 65–99)
Potassium: 3.5 mmol/L (ref 3.5–5.1)
Sodium: 139 mmol/L (ref 135–145)
Total Bilirubin: 0.7 mg/dL (ref 0.3–1.2)
Total Protein: 5.5 g/dL — ABNORMAL LOW (ref 6.5–8.1)

## 2015-10-19 LAB — GLUCOSE, CAPILLARY
GLUCOSE-CAPILLARY: 79 mg/dL (ref 65–99)
GLUCOSE-CAPILLARY: 86 mg/dL (ref 65–99)
GLUCOSE-CAPILLARY: 111 mg/dL — AB (ref 65–99)
Glucose-Capillary: 136 mg/dL — ABNORMAL HIGH (ref 65–99)
Glucose-Capillary: 168 mg/dL — ABNORMAL HIGH (ref 65–99)
Glucose-Capillary: 75 mg/dL (ref 65–99)

## 2015-10-19 LAB — AMMONIA: AMMONIA: 28 umol/L (ref 9–35)

## 2015-10-19 LAB — VITAMIN B12: Vitamin B-12: 1488 pg/mL — ABNORMAL HIGH (ref 180–914)

## 2015-10-19 LAB — CBC WITH DIFFERENTIAL/PLATELET
BASOS ABS: 0.1 10*3/uL (ref 0–0.1)
Basophils Relative: 1 %
Eosinophils Absolute: 0.1 10*3/uL (ref 0–0.7)
Eosinophils Relative: 1 %
HEMATOCRIT: 24.5 % — AB (ref 35.0–47.0)
Hemoglobin: 8 g/dL — ABNORMAL LOW (ref 12.0–16.0)
Lymphocytes Relative: 7 %
Lymphs Abs: 0.7 10*3/uL — ABNORMAL LOW (ref 1.0–3.6)
MCH: 28.2 pg (ref 26.0–34.0)
MCHC: 32.5 g/dL (ref 32.0–36.0)
MCV: 86.9 fL (ref 80.0–100.0)
Monocytes Absolute: 0.7 10*3/uL (ref 0.2–0.9)
Monocytes Relative: 7 %
NEUTROS PCT: 84 %
Neutro Abs: 8.9 10*3/uL — ABNORMAL HIGH (ref 1.4–6.5)
Platelets: 309 10*3/uL (ref 150–440)
RBC: 2.82 MIL/uL — AB (ref 3.80–5.20)
RDW: 16.8 % — ABNORMAL HIGH (ref 11.5–14.5)
WBC: 10.5 10*3/uL (ref 3.6–11.0)

## 2015-10-19 LAB — TSH: TSH: 3.377 u[IU]/mL (ref 0.350–4.500)

## 2015-10-19 MED ORDER — CETYLPYRIDINIUM CHLORIDE 0.05 % MT LIQD
7.0000 mL | Freq: Two times a day (BID) | OROMUCOSAL | Status: DC
  Administered 2015-10-19 – 2015-11-02 (×25): 7 mL via OROMUCOSAL

## 2015-10-19 MED ORDER — FUROSEMIDE 10 MG/ML IJ SOLN
80.0000 mg | Freq: Two times a day (BID) | INTRAMUSCULAR | Status: DC
  Administered 2015-10-19 – 2015-10-20 (×2): 80 mg via INTRAVENOUS
  Filled 2015-10-19 (×2): qty 8

## 2015-10-19 MED ORDER — CARVEDILOL 12.5 MG PO TABS
25.0000 mg | ORAL_TABLET | Freq: Two times a day (BID) | ORAL | Status: DC
  Administered 2015-10-19 – 2015-11-02 (×24): 25 mg via ORAL
  Filled 2015-10-19 (×6): qty 2
  Filled 2015-10-19: qty 1
  Filled 2015-10-19 (×4): qty 2
  Filled 2015-10-19: qty 1
  Filled 2015-10-19 (×6): qty 2
  Filled 2015-10-19: qty 1
  Filled 2015-10-19 (×2): qty 2
  Filled 2015-10-19 (×2): qty 1
  Filled 2015-10-19 (×2): qty 2
  Filled 2015-10-19: qty 1

## 2015-10-19 NOTE — Progress Notes (Signed)
Patient: Karen PellegriniCamille Y Armas / Admit Date: 10/10/2015 / Date of Encounter: 10/19/2015, 8:47 AM   Subjective: Intubated and sedate. She continues to have good urine output.  She was extubated. She feels better.   Review of Systems: Review of Systems  Unable to perform ROS: intubated    Objective: Telemetry: NSR Physical Exam: Blood pressure 140/61, pulse 89, temperature 98.3 F (36.8 C), temperature source Oral, resp. rate 16, height 5' 3.5" (1.613 m), weight 270 lb 11.6 oz (122.8 kg), last menstrual period 09/08/2015, SpO2 100 %. Body mass index is 47.2 kg/(m^2). General: Well developed, well nourished, in no acute distress. Head: Normocephalic, atraumatic, sclera non-icteric, no xanthomas, nares are without discharge. Neck: Negative for carotid bruits. JVP not elevated. Lungs: Clear bilaterally to auscultation without wheezes, rales, or rhonchi. Breathing is unlabored. Heart: RRR S1 S2 without murmurs, rubs, or gallops.  Abdomen: Non-tender, distended with normoactive bowel sounds. No rebound/guarding. Extremities: No clubbing or cyanosis. 1+ pitting edema throughout all extremities.  Neuro: alert Psych: slow to respond.    Intake/Output Summary (Last 24 hours) at 10/19/15 0847 Last data filed at 10/19/15 0600  Gross per 24 hour  Intake    120 ml  Output   3565 ml  Net  -3445 ml    Inpatient Medications:  . antiseptic oral rinse  7 mL Mouth Rinse BID  . carvedilol  25 mg Oral BID WC  . collagenase   Topical Daily  . enoxaparin (LOVENOX) injection  40 mg Subcutaneous Q24H  . famotidine  20 mg Oral Daily  . furosemide  80 mg Intravenous BID  . hydrALAZINE  37.5 mg Oral 3 times per day  . insulin aspart  0-9 Units Subcutaneous 6 times per day  . isosorbide dinitrate  10 mg Oral TID  . metolazone  5 mg Oral Daily  . sodium chloride  10-40 mL Intracatheter Q12H   Infusions:  . dextrose 5 % and 0.9% NaCl 10 mL/hr at 10/19/15 0601  . propofol (DIPRIVAN) infusion Stopped  (10/18/15 0600)    Labs:  Recent Labs  10/16/15 0948 10/17/15 0230 10/18/15 0400 10/19/15 0419  NA 140  139 138 138 139  K 4.1  4.1 3.9 3.5 3.5  CL 110  110 112* 106 109  CO2 24  23 23 22 24   GLUCOSE 132*  134* 78 97 83  BUN 52*  52* 52* 52* 53*  CREATININE 3.82*  3.80* 3.70* 3.88* 3.92*  CALCIUM 8.8*  8.9 8.4* 8.7* 8.3*  MG  --  1.9  --   --   PHOS 5.2* 5.2*  --   --     Recent Labs  10/17/15 0230 10/19/15 0419  AST  --  23  ALT  --  29  ALKPHOS  --  92  BILITOT  --  0.7  PROT  --  5.5*  ALBUMIN 1.8* 1.8*    Recent Labs  10/18/15 0400 10/19/15 0419  WBC 8.8 10.5  NEUTROABS  --  8.9*  HGB 8.4* 8.0*  HCT 25.6* 24.5*  MCV 86.3 86.9  PLT 296 309   No results for input(s): CKTOTAL, CKMB, TROPONINI in the last 72 hours. Invalid input(s): POCBNP No results for input(s): HGBA1C in the last 72 hours.   Weights: Filed Weights   10/17/15 0343 10/18/15 0500 10/19/15 0400  Weight: 291 lb 3.6 oz (132.1 kg) 277 lb 9 oz (125.9 kg) 270 lb 11.6 oz (122.8 kg)     Radiology/Studies:  Dg Chest  2 View  10/10/2015  CLINICAL DATA:  Short of breath and edema EXAM: CHEST  2 VIEW COMPARISON:  07/10/2013 FINDINGS: Lungs are severely under aerated. Small pleural effusions are noted on the lateral view. There is increased opacity towards the lung bases favored represent volume loss. Developing airspace disease is not excluded. Pulmonary vasculature is within normal limits. IMPRESSION: Low lung volumes. Small bilateral pleural effusions Bibasilar volume loss. Electronically Signed   By: Jolaine Click M.D.   On: 10/10/2015 15:46   Ct Head Wo Contrast  10/18/2015  CLINICAL DATA:  Altered mental status.  Lethargy. EXAM: CT HEAD WITHOUT CONTRAST TECHNIQUE: Contiguous axial images were obtained from the base of the skull through the vertex without intravenous contrast. COMPARISON:  None. FINDINGS: Gray-white differentiation is maintained. No CT evidence of acute large territory  infarct. No intraparenchymal or extra-axial mass or hemorrhage. Normal size a configuration of the ventricles and basilar cisterns. No midline shift. Limited visualization the paranasal emesis and mastoid air cells is normal. No air-fluid levels. Regional soft tissues appear normal. No displaced calvarial fracture. IMPRESSION: Negative noncontrast head CT. Electronically Signed   By: Simonne Come M.D.   On: 10/18/2015 21:17   US Renal  10/10/2015  CLINICAL DATA:  Acute renal failure. EXAM: RENAL / URINARY TRACT ULTRASOUND COMPLETE COMPARISON:  07/10/2013 chest CT. FINDINGS: Right Kidney: Length: 9.3 cm. Echogenic right kidney with no right hydronephrosis and no right renal mass. No right renal cortical atrophy. Left Kidney: Length: 10.7 cm. Echogenic left kidney with no left hydronephrosis and no left renal mass. No left renal cortical atrophy. Incidentally noted is a small left pleural effusion. Bladder: Appears normal for degree of bladder distention. IMPRESSION: 1. No hydronephrosis. 2. Echogenic normal size kidneys, indicating nonspecific renal parenchymal disease of uncertain chronicity. 3. Incidental small left pleural effusion. 4. Normal bladder. Electronically Signed   By: Delbert Phenix M.D.   On: 10/10/2015 18:59   Dg Chest Port 1 View  10/16/2015  CLINICAL DATA:  Respiratory failure.  Intubated. EXAM: PORTABLE CHEST 1 VIEW COMPARISON:  10/15/2015 FINDINGS: Endotracheal tube remains near the carina with the tip approximately 14 mm above the carina. There is cardiomegaly. Moderate layering right pleural effusion. Bilateral vascular congestion and airspace opacities could reflect edema. Mild cardiomegaly. Low lung volumes. IMPRESSION: Endotracheal tube just above the carina approximately 14 mm. Layering right effusion. Bilateral airspace opacities likely reflects edema. Low volumes with cardiomegaly. Electronically Signed   By: Charlett Nose M.D.   On: 10/16/2015 11:55   Dg Chest Port 1 View  10/15/2015   CLINICAL DATA:  Status post intubation. EXAM: PORTABLE CHEST 1 VIEW COMPARISON:  Earlier film, same date. FINDINGS: External pacer paddles are noted. The right IJ center venous catheter tip is in the right atrium and could be retracted 2 cm. The endotracheal tube is right at the carina. Very low lung volumes with vascular crowding atelectasis. Stable cardiac enlargement and pulmonary edema. IMPRESSION: 1. The endotracheal tube is right at the carina and should be retracted approximately 2-3 cm. 2. Right IJ central venous catheter tip is in the right atrium. This could be retracted 2-3 cm. 3. Low lung volumes with vascular crowding, atelectasis and persistent mild interstitial edema. Electronically Signed   By: Rudie Meyer M.D.   On: 10/15/2015 19:26   Dg Chest Port 1 View  10/15/2015  CLINICAL DATA:  Central line placement.  Readjustment. EXAM: PORTABLE CHEST 1 VIEW COMPARISON:  10/15/2015 FINDINGS: Right central line tip is near the  cavoatrial junction. Decreasing lung volumes with low lung volumes increasing bilateral lower lobe opacities. Cardiomegaly. No pneumothorax. Mild vascular congestion. IMPRESSION: Retraction of the right central line with the tip now near the cavoatrial junction. Cardiomegaly, vascular congestion. Decreasing lung volumes with bibasilar atelectasis or infiltrates. Electronically Signed   By: Charlett Nose M.D.   On: 10/15/2015 14:46   Dg Chest Port 1 View  10/15/2015  CLINICAL DATA:  Central line placement EXAM: PORTABLE CHEST 1 VIEW COMPARISON:  10/10/2015 FINDINGS: Enlarged cardiac silhouette. Hazy attenuation in the perihilar region bilaterally suggesting the possibility of pulmonary edema. Right internal jugular central line identified with tip extending about 3 cm into the right atrium. No pneumothorax. IMPRESSION: No pneumothorax.  Central line extends into right atrium. Electronically Signed   By: Esperanza Heir M.D.   On: 10/15/2015 13:51     Assessment and Plan   1.  PEA arrest/acute respiratory failure: -Patient transferred to ICU on the evening of 11/7 after suffering PEA arrest likely due to severe fluid overload.  -CPR was initiated and meds delivered. No shocks. Question of Vfib, though strips appear to show artifact and regular WCT with possible bundle - continue supportive care.   2. 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 - I decreased IV Lasix to 80 mg bid. continue metolazone -   -Her anasarca is out of proportion to her EF and right-sided pressure . CKD and nephrotic syndrome are likely the main drivers of fluid overload.  - Ischemic cardiac evaluation once she recovers. Likely outpatient.   3. Acute on CKD stage IV: -No indication for dialysis at this time per Renal, see above -With proteinuria (nephrotic range 4.9 grams), felt to be 2/2 DM, possibly in the setting of poorly controlled CHF and HTN as well -Renally dose medications -Avoid nephrotoxins  4. Uncontrolled diabetes, type 2: -Per IM  5. HTN with associated renal failure and heart failure, uncontrolled: -Coreg increased to 25 mg bid. No ACE I/ARB due to acute on CKD. Continue Imdur/Hudralazine.    Signed, Lorine Bears, MD.    10/19/2015, 8:47 AM

## 2015-10-19 NOTE — Progress Notes (Signed)
[facility] [place] [facility]   PATIENT NAME: Karen Rich    MR#:  [id number]  DATE OF BIRTH:  03/26/1976  SUBJECTIVE:  Admitted 10/10/2015 for anasarca and ascites due to CHF and hypoalbuminemia. PEA/Vfib with CODE BLUE on 10/15/2015. Intubated and transferred to ICU. Patient has diuresed well on IV Lasix and extubated on 10/18/2015. Patient is awake and alert at this point on 2 L oxygen. No complaints other than some shortness of breath and feeling weak. REVIEW OF SYSTEMS:   Review of Systems  Constitutional: Positive for malaise/fatigue. Negative for fever and chills.  HENT: Negative for sore throat.   Eyes: Negative for blurred vision, double vision and pain.  Respiratory: Positive for cough and shortness of breath. Negative for hemoptysis and wheezing.   Cardiovascular: Positive for leg swelling. Negative for chest pain, palpitations and orthopnea.  Gastrointestinal: Negative for heartburn, nausea, vomiting, abdominal pain, diarrhea and constipation.  Genitourinary: Negative for dysuria and hematuria.  Musculoskeletal: Positive for back pain. Negative for joint pain.  Skin: Negative for rash.  Neurological: Positive for weakness. Negative for sensory change, speech change, focal weakness and headaches.  Endo/Heme/Allergies: Does not bruise/bleed easily.  Psychiatric/Behavioral: Negative for depression. The patient is not nervous/anxious.    DRUG ALLERGIES:  No Known Allergies VITALS:  Blood pressure 140/74, pulse 86, temperature 98.3 F (36.8 C), temperature source Oral, resp. rate 16, height 5' 3.5" (1.613 m), weight 122.8 kg (270 lb 11.6 oz), last menstrual period 09/08/2015, SpO2 99 %. PHYSICAL EXAMINATION:   Physical Exam  Constitutional: She is oriented to person, place, and time and well-developed, well-nourished, and in no distress. No distress.  HENT:  Head: Normocephalic.  Eyes: No scleral icterus.  Neck: No JVD present. No  tracheal deviation present.  Cardiovascular: Exam reveals no gallop and no friction rub.   No murmur heard. Pulmonary/Chest: She has rales.  Abdominal: There is no tenderness.  Musculoskeletal: She exhibits edema.  Patient has anasarca  Neurological: She is alert and oriented to person, place, and time.  Moves all 4 extremities  Skin: Skin is warm. No rash noted. No erythema.  Nursing note and vitals reviewed.  LABORATORY PANEL:   CBC  Recent Labs Lab 10/19/15 0419  WBC 10.5  HGB 8.0*  HCT 24.5*  PLT 309   ------------------------------------------------------------------------------------------------------------------  Chemistries   Recent Labs Lab 10/17/15 0230  10/19/15 0419  NA 138  < > 139  K 3.9  < > 3.5  CL 112*  < > 109  CO2 23  < > 24  GLUCOSE 78  < > 83  BUN 52*  < > 53*  CREATININE 3.70*  < > 3.92*  CALCIUM 8.4*  < > 8.3*  MG 1.9  --   --   AST  --   --  23  ALT  --   --  29  ALKPHOS  --   --  92  BILITOT  --   --  0.7  < > = values in this interval not displayed. ------------------------------------------------------------------------------------------------------------------  Cardiac Enzymes No results for input(s): TROPONINI in the last 168 hours. ------------------------------------------------------------------------------------------------------------------  RADIOLOGY:  Ct Head Wo Contrast  10/18/2015  CLINICAL DATA:  Altered mental status.  Lethargy. EXAM: CT HEAD WITHOUT CONTRAST TECHNIQUE: Contiguous axial images were obtained from the base of the skull through the vertex without intravenous contrast. COMPARISON:  None. FINDINGS: Gray-white differentiation is maintained. No CT evidence of acute large territory infarct. No intraparenchymal or extra-axial mass or  hemorrhage. Normal size a configuration of the ventricles and basilar cisterns. No midline shift. Limited visualization the paranasal emesis and mastoid air cells is normal. No  air-fluid levels. Regional soft tissues appear normal. No displaced calvarial fracture. IMPRESSION: Negative noncontrast head CT. Electronically Signed   By: Simonne Come M.D.   On: 10/18/2015 21:17   ASSESSMENT AND PLAN:  39 year old female with a history of type 2 diabetes, hyperlipidemia and chronic kidney disease stage III presents to the ER with ascites and acute on chronic renal failure. More her to intensive care unit after CODE BLUE with asystole and got intubated  * Acute hypoxic resp failure Due to chf Extubated and improving  * PEA/Vfib?  Code blue 11/7. Now stable Cardiology following. Needs Ischemic w/u as OP when better.  * Hypoglycemia with H/o Type 2 diabetes: Continue sliding scale insulin A1c is 6.6  * Acute on chronic combined systolic and diastolic CHF  On lasix 80 mg TID for now per nephro Monitor BUN/Cr I/Os. Daily weight   * Acute on CKD 4: nephro following. 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 Avoid nephrotoxins and close monitoring. Creat stable  * Generalized anasarca: This is likely secondary to renal disease with nephrotic proteinuria and acute combined diastolic and systolic heart failure. On lasix TID  * HTN: continue Coreg, isosorbide dinitrate, hydralazine, and diuretics. Will adjust as need.  * Pressure ulcer present on admission right malleolus with skin breakdown to buttocks:  Wound care on board  * Anemia on Chronic Disease:  Monitor  CODE STATUS: FULL CODE  TOTAL  TIME TAKING CARE OF THIS PATIENT: 35 minutes.   Will transfer to [facility] floor today.  POSSIBLE D/C 3-4 days, DEPENDING ON CLINICAL CONDITION.   Milagros Loll R M.D on 10/19/2015 at 9:57 AM  Between 7am to 6pm - Pager - [phone] After 6pm go to [email or web address] - password [email or web address]  [facility]  Office  [phone]  CC: Primary care physician; Sherrie Mustache, MD  Note: This dictation was prepared with Dragon dictation along with smaller phrase  technology. Any transcriptional errors that result from this process are unintentional.

## 2015-10-19 NOTE — Progress Notes (Signed)
Report called to [name], RN on 2A. Patient to be transferred to 2A room 260. Patient and visitors at bedside informed.

## 2015-10-19 NOTE — Progress Notes (Signed)
Subjective:   Extubated yesterday. CT head negative. Answering questions appropriately.  UOP  3565 (4375) (3880)  furosemide 80mg  IV q6 and metolazone  Objective:  Vital signs in last 24 hours:  Temp:  [97.3 F (36.3 C)-99.4 F (37.4 C)] 99.4 F (37.4 C) (11/11 0400) Pulse Rate:  [69-88] 88 (11/11 0600) Resp:  [16-36] 21 (11/11 0600) BP: (125-151)/(59-83) 151/72 mmHg (11/11 0600) SpO2:  [94 %-100 %] 100 % (11/11 0600) FiO2 (%):  [28 %] 28 % (11/10 1203) Weight:  [122.8 kg (270 lb 11.6 oz)] 122.8 kg (270 lb 11.6 oz) (11/11 0400)  Weight change: -3.1 kg (-6 lb 13.4 oz) Filed Weights   10/17/15 0343 10/18/15 0500 10/19/15 0400  Weight: 132.1 kg (291 lb 3.6 oz) 125.9 kg (277 lb 9 oz) 122.8 kg (270 lb 11.6 oz)    Intake/Output:    Intake/Output Summary (Last 24 hours) at 10/19/15 0820 Last data filed at 10/19/15 0600  Gross per 24 hour  Intake    120 ml  Output   3565 ml  Net  -3445 ml     Physical Exam: General: Critically ill, +anasarca  HEENT ETT  Neck Right central line  Pulm/lungs Clear, room air  CVS/Heart Regular rhythm  Abdomen:  +distended, +abdominal wall edema  Extremities: 1+ pitting edema all four extremities  Neurologic: Alert, oriented, ambulatory  Skin: excoriations          Basic Metabolic Panel:   Recent Labs Lab 10/15/15 2002 10/16/15 0948 10/17/15 0230 10/18/15 0400 10/19/15 0419  NA 135 140  139 138 138 139  K 4.5 4.1  4.1 3.9 3.5 3.5  CL 106 110  110 112* 106 109  CO2 22 24  23 23 22 24   GLUCOSE 248* 132*  134* 78 97 83  BUN 50* 52*  52* 52* 52* 53*  CREATININE 3.63* 3.82*  3.80* 3.70* 3.88* 3.92*  CALCIUM 8.8* 8.8*  8.9 8.4* 8.7* 8.3*  MG 2.1  --  1.9  --   --   PHOS  --  5.2* 5.2*  --   --      CBC:  Recent Labs Lab 10/15/15 2002 10/16/15 0948 10/17/15 0230 10/18/15 0400 10/19/15 0419  WBC 7.0 4.7 3.9 8.8 10.5  NEUTROABS  --   --   --   --  8.9*  HGB 8.6* 7.9* 8.4* 8.4* 8.0*  HCT 25.4* 25.2* 26.2* 25.6*  24.5*  MCV 88.6 87.7 86.7 86.3 86.9  PLT 287 298 297 296 309      Microbiology:  Recent Results (from the past 720 hour(s))  C difficile quick scan w PCR reflex     Status: None   Collection Time: 10/10/15 10:21 PM  Result Value Ref Range Status   C Diff antigen NEGATIVE NEGATIVE Final   C Diff toxin NEGATIVE NEGATIVE Final   C Diff interpretation Negative for C. difficile  Final  C difficile quick scan w PCR reflex     Status: None   Collection Time: 10/12/15 10:35 AM  Result Value Ref Range Status   C Diff antigen NEGATIVE NEGATIVE Final   C Diff toxin NEGATIVE NEGATIVE Final   C Diff interpretation Negative for C. difficile  Final  MRSA PCR Screening     Status: None   Collection Time: 10/17/15  9:02 AM  Result Value Ref Range Status   MRSA by PCR NEGATIVE NEGATIVE Final    Comment:        The GeneXpert MRSA Assay (FDA  approved for NASAL specimens only), is one component of a comprehensive MRSA colonization surveillance program. It is not intended to diagnose MRSA infection nor to guide or monitor treatment for MRSA infections.   C difficile quick scan w PCR reflex     Status: None   Collection Time: 10/18/15  5:50 AM  Result Value Ref Range Status   C Diff antigen NEGATIVE NEGATIVE Final   C Diff toxin NEGATIVE NEGATIVE Final   C Diff interpretation Negative for C. difficile  Final    Coagulation Studies: No results for input(s): LABPROT, INR in the last 72 hours.  Urinalysis: No results for input(s): COLORURINE, LABSPEC, PHURINE, GLUCOSEU, HGBUR, BILIRUBINUR, KETONESUR, PROTEINUR, UROBILINOGEN, NITRITE, LEUKOCYTESUR in the last 72 hours.  Invalid input(s): APPERANCEUR    Imaging: Ct Head Wo Contrast  10/18/2015  CLINICAL DATA:  Altered mental status.  Lethargy. EXAM: CT HEAD WITHOUT CONTRAST TECHNIQUE: Contiguous axial images were obtained from the base of the skull through the vertex without intravenous contrast. COMPARISON:  None. FINDINGS: Gray-white  differentiation is maintained. No CT evidence of acute large territory infarct. No intraparenchymal or extra-axial mass or hemorrhage. Normal size a configuration of the ventricles and basilar cisterns. No midline shift. Limited visualization the paranasal emesis and mastoid air cells is normal. No air-fluid levels. Regional soft tissues appear normal. No displaced calvarial fracture. IMPRESSION: Negative noncontrast head CT. Electronically Signed   By: Simonne Come M.D.   On: 10/18/2015 21:17     Medications:   . dextrose 5 % and 0.9% NaCl 10 mL/hr at 10/19/15 0601  . propofol (DIPRIVAN) infusion Stopped (10/18/15 0600)   . antiseptic oral rinse  7 mL Mouth Rinse QID  . carvedilol  12.5 mg Oral BID WC  . chlorhexidine gluconate  15 mL Mouth Rinse BID  . collagenase   Topical Daily  . enoxaparin (LOVENOX) injection  40 mg Subcutaneous Q24H  . famotidine  20 mg Oral Daily  . furosemide  80 mg Intravenous Q6H  . hydrALAZINE  37.5 mg Oral 3 times per day  . insulin aspart  0-9 Units Subcutaneous 6 times per day  . isosorbide dinitrate  10 mg Oral TID  . metolazone  5 mg Oral Daily  . sodium chloride  10-40 mL Intracatheter Q12H   sodium chloride, acetaminophen **OR** acetaminophen, albuterol, fentaNYL (SUBLIMAZE) injection, fentaNYL (SUBLIMAZE) injection, hydrALAZINE, loperamide, ondansetron **OR** ondansetron (ZOFRAN) IV, sodium chloride, sodium chloride  Assessment/ Plan:  39 y.o. female with medical problems of Diabetes type II (since Age 43, poorly controlled) with severe complications including retinopathy and kidney disease, hypertension, obesity who was admitted to [facility] on 10/10/2015 for evaluation of acute renal failure and anasarca.  1. 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 Acute renal failure from acute cardiorenal syndrome from systolic acute congestive heart failure and now acute respiratory failure  requiring mechanical ventilation. Echocardiogram from 11/3 with EF of 40-45%. Also with hypotension during cardiac arrest.  Chronic kidney disease with proteinuria (nephrotic range: 4.9 grams) secondary to diabetic nephropathy. 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 Baseline creatinine of 1.2 in 08/2014. Unclear how much progression of disease. - Creatinine stable from last few days. UOP responsive to IV furosemide. Continue furosemide  IV q6 hours. Continue metolazone - monitor serum electrolytes. No acute indication for dialysis. - not a candidate for ACE-I/ARB at this time.   2. Acute exacerbation of systolic congestive heart failure with anasarca: currently with significant volume overload -  furosemide and metolazone as above.  - monitor urine output, volume status - Appreciate cardiology input  3. 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  - Continue insulin regimen and glucose control.   4. Hypertension: volume driven. Blood pressure better controlled.  - Current regimen of carvedilol, isosorbide dinitrate, hydralazine and diuretics.  Carvedilol as per cardiology.   Isosorbide dinitrate and hydralazine for afterload reduction.    LOS: 9 Ioannis Schuh 11/11/20168:20 AM

## 2015-10-19 NOTE — Care Management Note (Signed)
Case Management Note  Patient Details  Name: Karen Rich MRN: [id number] Date of Birth: Sep 18, 1976  Subjective/Objective:  Admitted 11/02 with CHF and hypoalbuminemia. PEA/Vfib on 11/07.  Intubated and transferred to ICU. Responded to lasix and was extubated on 11/10 Patient is alert and oriented at this point. On 2 L/ [name]  For transfer back to the floor.                   Action/Plan:   Expected Discharge Date:                  Expected Discharge Plan:     In-House Referral:     Discharge planning Services     Post Acute Care Choice:    Choice offered to:     DME Arranged:    DME Agency:     HH Arranged:    HH Agency:     Status of Service:     Medicare Important Message Given:    Date Medicare IM Given:    Medicare IM give by:    Date Additional Medicare IM Given:    Additional Medicare Important Message give by:     If discussed at Long Length of Stay Meetings, dates discussed:    Additional Comments:  Karen MemosLisa M Jamerson Vonbargen, RN 10/19/2015, 12:23 PM

## 2015-10-19 NOTE — Progress Notes (Signed)
Inpatient Diabetes Program Recommendations  AACE/ADA: New Consensus Statement on Inpatient Glycemic Control (2015)  Target Ranges:  Prepandial:   less than 140 mg/dL      Peak postprandial:   less than 180 mg/dL (1-2 hours)      Critically ill patients:  140 - 180 mg/dL   Review of Glycemic Control  Results for Felicity PellegriniHAITH, Dimples Y (MRN [id number]) as of 10/19/2015 09:24  Ref. Range 10/18/2015 15:42 10/18/2015 20:25 10/18/2015 23:22 10/19/2015 04:11 10/19/2015 07:25  Glucose-Capillary Latest Ref Range: 65-99 mg/dL 85 85 90 75 79   Current orders for Inpatient glycemic control: Novolog 0-9 units q4h- agree.   Will continue to follow and make further recommendations regarding basal insulin as more data is collected and when she is no longer NPO.   Susette RacerJulie Kathelene Rumberger, RN, BA, MHA, CDE Diabetes Coordinator Inpatient Diabetes Program  [phone] (Team Pager) [phone] [facility] Office) 10/19/2015 9:35 AM .

## 2015-10-19 NOTE — Progress Notes (Signed)
Received report from Sarah in ICU. Patient has a right IJ that is saline locked. Patient has open blisters on right AC area,  right thigh open to air, right calf and ankle and abrasions on bilateral leg and arm. No pressure areas noted. Skin assessed with Caswell CorwinAbigail Jackson. Patient has no complaints of pain.   Filed Vitals:   10/19/15 1459  BP: 132/60  Pulse: 82  Temp: 98 F (36.7 C)  Resp: 18

## 2015-10-19 NOTE — Clinical Documentation Improvement (Deleted)
Internal Medicine  Please choose the clarification below as appropriate for this admission:   The following documentation is noted in the medical record   Diagnosis 1:  Diabetes Mellitus type 1       Location on: Hospital Problem list  Diagnosis 2:  Diabetes Mellitus type 2       Location:  Progress notes            Please clarify the appropriate diagnosis for this patient;    Type 1 Diabetes   Type 2 Diabetes  Other  Clinically Undetermined    Please exercise your independent, professional judgment when responding. A specific answer is not anticipated or expected.   Thank You, Lavonda JumboLawanda J Delphine [facility]izem[facility] [place] [phone]

## 2015-10-19 NOTE — Consult Note (Signed)
PULMONARY / CRITICAL CARE MEDICINE   Name: Karen PellegriniCamille Y Rich MRN: [id number] DOB: 05-13-1976    ADMISSION DATE:  10/10/2015 CONSULTATION DATE:  10/16/15  REFERRING MD : Dr. Amado CoeGouru   CHIEF COMPLAINT:     Cardiac arrest, respiratory failure   HISTORY OF PRESENT ILLNESS - per chart review   Patient admitted on 10/10/15 with the below history: 39 y.o. female with a known history of diabetes type 2, hyperlipidemia, hypertension, chronic kidney disease stage III who presents to the ED with complaint of swelling of her body diffusely. She reports that she stop taking all her medication including her insulin a few months ago. She also reports that she's been having shortness of breath ongoing for the past few months and is unable to lay flat and has to sleep in a chair. She denies any chest pain fevers or chills. Denies any nausea vomiting or diarrhea  On 10/15/15 the following events occurred Code blue was called as pt was c/o chest pain , gasping and became unresponsive . Patient was found to be bradycardic and eventually asystole-ACLS protocol was implemented for PEA by the ED physician. Once patient's rhythm was back she was intubated and moved to intensive care unit. Her rhythm strips were suggestive of ventricular fibrillation, however patient was able to move her eyes also moving her RUE to verbal commands, and hypothermia protocol was not initiated.   SUBJECTIVE: Patient more interactive today, on pressure support mode, sedation turned off, following simple commands, but very somnolent, however easily arousable.  SIGNIFICANT EVENTS    11/7>> CODE BLUE, V. Tach> bradycardia> asystole, 2 rounds of ACLS and intubated 11/7>> RIJ CVL   PAST MEDICAL HISTORY    :  Past Medical History  Diagnosis Date  . Edema   . Hyperlipidemia   . Hyperglycemia   . Fatigue   . Irregular heart beat   . Hypertension   . Diabetes mellitus without complication (HCC)   . Renal disorder   . Renal failure      stage 3   Past Surgical History  Procedure Laterality Date  . None     Prior to Admission medications   Not on File   No Known Allergies   FAMILY HISTORY   Family History  Problem Relation Age of Onset  . Hypertension Mother   . Diabetes Mother   . Hypertension Father   . CVA Father       SOCIAL HISTORY    reports that she has never smoked. She does not have any smokeless tobacco history on file. She reports that she does not drink alcohol or use illicit drugs.  Review of Systems  Constitutional: Negative for fever and chills.  Eyes: Negative for blurred vision.  Respiratory: Positive for cough and sputum production.   Cardiovascular: Negative for chest pain.  Gastrointestinal: Negative for heartburn and nausea.  Musculoskeletal: Negative for myalgias.  Skin: Negative for itching and rash.  Neurological: Negative for dizziness and headaches.      VITAL SIGNS    Temp:  [97.3 F (36.3 C)-99.4 F (37.4 C)] 98.3 F (36.8 C) (11/11 0815) Pulse Rate:  [69-89] 89 (11/11 0815) Resp:  [16-36] 16 (11/11 0815) BP: (125-151)/(53-83) 140/61 mmHg (11/11 0800) SpO2:  [94 %-100 %] 100 % (11/11 0815) FiO2 (%):  [28 %] 28 % (11/10 1203) Weight:  [270 lb 11.6 oz (122.8 kg)] 270 lb 11.6 oz (122.8 kg) (11/11 0400) HEMODYNAMICS: CVP:  [16 mmHg] 16 mmHg VENTILATOR SETTINGS: Vent Mode:  [-]  Spontaneous FiO2 (%):  [28 %] 28 % PEEP:  [5 cmH20] 5 cmH20 Pressure Support:  [5 cmH20] 5 cmH20 INTAKE / OUTPUT:  Intake/Output Summary (Last 24 hours) at 10/19/15 1610 Last data filed at 10/19/15 0600  Gross per 24 hour  Intake    120 ml  Output   3565 ml  Net  -3445 ml       PHYSICAL EXAM   Physical Exam  Constitutional: She appears well-developed.  HENT:  Head: Normocephalic.  Right Ear: External ear normal.  Left Ear: External ear normal.  Neck: Neck supple. No thyromegaly present.  Cardiovascular: Normal rate, regular rhythm, normal heart sounds and intact  distal pulses.   No murmur heard. Pulmonary/Chest: She has no wheezes.  Extubated, coarse upper airway sounds.   Abdominal: Soft. Bowel sounds are normal. She exhibits no distension.  Musculoskeletal:  Edema to mid legs Generalized anasarca  Neurological:  More alert today, but still with periods of somnolence.   Skin: Skin is warm and dry.       LABS   LABS:  CBC  Recent Labs Lab 10/17/15 0230 10/18/15 0400 10/19/15 0419  WBC 3.9 8.8 10.5  HGB 8.4* 8.4* 8.0*  HCT 26.2* 25.6* 24.5*  PLT 297 296 309   Coag's No results for input(s): APTT, INR in the last 168 hours. BMET  Recent Labs Lab 10/17/15 0230 10/18/15 0400 10/19/15 0419  NA 138 138 139  K 3.9 3.5 3.5  CL 112* 106 109  CO2 BUN 52* 52* 53*  CREATININE 3.70* 3.88* 3.92*  GLUCOSE 78 97 83   Electrolytes  Recent Labs Lab 10/15/15 2002 10/16/15 0948 10/17/15 0230 10/18/15 0400 10/19/15 0419  CALCIUM 8.8* 8.8*  8.9 8.4* 8.7* 8.3*  MG 2.1  --  1.9  --   --   PHOS  --  5.2* 5.2*  --   --    Sepsis Markers No results for input(s): LATICACIDVEN, PROCALCITON, O2SATVEN in the last 168 hours. ABG  Recent Labs Lab 10/16/15 1145 10/18/15 1220 10/18/15 1710  PHART 7.39 7.42 7.41  PCO2ART 35 35 38  PO2ART 152* 94 116*   Liver Enzymes  Recent Labs Lab 10/15/15 2002 10/16/15 0948 10/17/15 0230 10/19/15 0419  AST 31  --   --  23  ALT 31  --   --  29  ALKPHOS 102  --   --  92  BILITOT 0.4  --   --  0.7  ALBUMIN 2.0* 1.9* 1.8* 1.8*   Cardiac Enzymes No results for input(s): TROPONINI, PROBNP in the last 168 hours. Glucose  Recent Labs Lab 10/18/15 1136 10/18/15 1542 10/18/15 2025 10/18/15 2322 10/19/15 0411 10/19/15 0725  GLUCAP 121* 85 85 90 75 79     Recent Results (from the past 240 hour(s))  C difficile quick scan w PCR reflex     Status: None   Collection Time: 10/10/15 10:21 PM  Result Value Ref Range Status   C Diff antigen NEGATIVE NEGATIVE Final   C Diff  toxin NEGATIVE NEGATIVE Final   C Diff interpretation Negative for C. difficile  Final  C difficile quick scan w PCR reflex     Status: None   Collection Time: 10/12/15 10:35 AM  Result Value Ref Range Status   C Diff antigen NEGATIVE NEGATIVE Final   C Diff toxin NEGATIVE NEGATIVE Final   C Diff interpretation Negative for C. difficile  Final  MRSA PCR Screening  Status: None   Collection Time: 10/17/15  9:02 AM  Result Value Ref Range Status   MRSA by PCR NEGATIVE NEGATIVE Final    Comment:        The GeneXpert MRSA Assay (FDA approved for NASAL specimens only), is one component of a comprehensive MRSA colonization surveillance program. It is not intended to diagnose MRSA infection nor to guide or monitor treatment for MRSA infections.   C difficile quick scan w PCR reflex     Status: None   Collection Time: 10/18/15  5:50 AM  Result Value Ref Range Status   C Diff antigen NEGATIVE NEGATIVE Final   C Diff toxin NEGATIVE NEGATIVE Final   C Diff interpretation Negative for C. difficile  Final     Current facility-administered medications:  .  0.9 %  sodium chloride infusion, 250 mL, Intravenous, PRN, Auburn Bilberry, MD .  acetaminophen (TYLENOL) tablet 650 mg, 650 mg, Oral, Q6H PRN **OR** acetaminophen (TYLENOL) suppository 650 mg, 650 mg, Rectal, Q6H PRN, Auburn Bilberry, MD .  albuterol (PROVENTIL) (2.5 MG/3ML) 0.083% nebulizer solution 2.5 mg, 2.5 mg, Nebulization, Q2H PRN, Lupita Leash, MD .  antiseptic oral rinse (CPC / CETYLPYRIDINIUM CHLORIDE 0.05%) solution 7 mL, 7 mL, Mouth Rinse, BID, Timaya Bojarski, MD .  carvedilol (COREG) tablet 25 mg, 25 mg, Oral, BID WC, Iran Ouch, MD .  collagenase (SANTYL) ointment, , Topical, Daily, Sital Mody, MD .  dextrose 5 %-0.9 % sodium chloride infusion, , Intravenous, Continuous, Arnaldo Natal, MD, Last Rate: 10 mL/hr at 10/19/15 0601 .  enoxaparin (LOVENOX) injection 40 mg, 40 mg, Subcutaneous, Q24H, Howie Rufus,  MD, 40 mg at 10/18/15 2259 .  famotidine (PEPCID) tablet 20 mg, 20 mg, Oral, Daily, Srikar Sudini, MD .  fentaNYL (SUBLIMAZE) injection 100 mcg, 100 mcg, Intravenous, Q15 min PRN, Lupita Leash, MD .  fentaNYL (SUBLIMAZE) injection 100 mcg, 100 mcg, Intravenous, Q2H PRN, Lupita Leash, MD .  furosemide (LASIX) injection 80 mg, 80 mg, Intravenous, BID, Iran Ouch, MD .  hydrALAZINE (APRESOLINE) injection 10 mg, 10 mg, Intravenous, Q6H PRN, Auburn Bilberry, MD .  hydrALAZINE (APRESOLINE) tablet 37.5 mg, 37.5 mg, Oral, 3 times per day, Tonny Bollman, MD, 37.5 mg at 10/19/15 0556 .  insulin aspart (novoLOG) injection 0-9 Units, 0-9 Units, Subcutaneous, 6 times per day, Delfino Lovett, MD, 1 Units at 10/18/15 1326 .  isosorbide dinitrate (ISORDIL) tablet 10 mg, 10 mg, Oral, TID, Tonny Bollman, MD, 10 mg at 10/18/15 2300 .  loperamide (IMODIUM) capsule 2 mg, 2 mg, Oral, Q6H PRN, Adrian Saran, MD, 2 mg at 10/15/15 1035 .  metolazone (ZAROXOLYN) tablet 5 mg, 5 mg, Oral, Daily, Lamont Dowdy, MD, 5 mg at 10/18/15 1129 .  ondansetron (ZOFRAN) tablet 4 mg, 4 mg, Oral, Q6H PRN **OR** ondansetron (ZOFRAN) injection 4 mg, 4 mg, Intravenous, Q6H PRN, Auburn Bilberry, MD .  propofol (DIPRIVAN) 1000 MG/100ML infusion, 0-50 mcg/kg/min, Intravenous, Continuous, Lupita Leash, MD, Stopped at 10/18/15 0600 .  sodium chloride 0.9 % injection 10-40 mL, 10-40 mL, Intracatheter, Q12H, Sital Mody, MD, 10 mL at 10/18/15 1130 .  sodium chloride 0.9 % injection 10-40 mL, 10-40 mL, Intracatheter, PRN, Adrian Saran, MD .  sodium chloride 0.9 % injection 3 mL, 3 mL, Intravenous, PRN, Auburn Bilberry, MD, 3 mL at 10/11/15 0523  IMAGING    Ct Head Wo Contrast  10/18/2015  CLINICAL DATA:  Altered mental status.  Lethargy. EXAM: CT HEAD WITHOUT CONTRAST TECHNIQUE: Contiguous axial  images were obtained from the base of the skull through the vertex without intravenous contrast. COMPARISON:  None. FINDINGS: Gray-white  differentiation is maintained. No CT evidence of acute large territory infarct. No intraparenchymal or extra-axial mass or hemorrhage. Normal size a configuration of the ventricles and basilar cisterns. No midline shift. Limited visualization the paranasal emesis and mastoid air cells is normal. No air-fluid levels. Regional soft tissues appear normal. No displaced calvarial fracture. IMPRESSION: Negative noncontrast head CT. Electronically Signed   By: Simonne Come M.D.   On: 10/18/2015 21:17      Indwelling Urinary Catheter continued, requirement due to   Reason to continue Indwelling Urinary Catheter for strict Intake/Output monitoring for hemodynamic instability   Central Line continued, requirement due to   Reason to continue [employer] Monitoring of central venous pressure or other hemodynamic parameters   Ventilator continued, requirement due to, resp failure    Ventilator Sedation RASS 0 to -2   Cultures: BCx2  UC  Sputum  Antibiotics:  Lines: RIJ CVL 11/7>>  ASSESSMENT/PLAN  39 year old female admitted for anasarca, CKD stage II, had a cardiac arrest on the medical floor, transferred to the medical ICU after 2 rounds of ACLS, intubated and sedated, now extubated, with mild to moderate confusion.   PULMONARY Respiratory failure-resovled Cardiac arrest Requirement for mechanical ventilation New onset CHF (combined systolic and diastolic) P:   - extubated on 16/10, doing well  -Maintain O2 sat greater than 88% -Respiratory failure complicated by this new onset CHF -Planning for diuresis -Monitor creatinine levels during diuresis  CARDIOVASCULAR CVL RIJ A:  PEA arrest Combined CHF Anasarca Hypertension P:  -Now with stable cardiac rhythm, being evaluated by cardiology -Her EF is 40-45 percent -Cardiology actively following, has combined systolic and diastolic CHF, currently being treated with somewhat aggressive diuresis -Will require further outpatient  cardiology workup -Diuresis - anasarca out of proportion to previous ECHO findings, concern for PE, not a CTA or V/Q candidate, will recheck ECHO and evaluate RV.   RENAL History of chronic kidney disease Elevated creatinine P:   -Monitor creatinine levels, continue with diuresis as tolerated -Appreciate nephrology recommendations -ICU electrolyte replacement protocol  GASTROINTESTINAL -GI prophylaxis  HEMATOLOGIC A:  Anemia P:  -Monitor blood counts - transfuse per ICU protocol, Hb<7.0   ENDOCRINE Type 2 diabetes -Continue with SSI  NEUROLOGIC AMS, Confusion - slowly improving - per family has had AMS over the past couple of months.  - CT head - negative - continue to monitor neuro status.     I have personally obtained a history, examined the patient, evaluated laboratory and imaging results, formulated the assessment and plan and placed orders.  Critical Care Time devoted to patient care services described in this note is 35 minutes.   Stephanie Acre, MD [facility] Pager [phone] (please enter 7-digits) On Call Pager - [phone] (please enter 7-digits)     10/19/2015, 9:03 AM  Note: This note was prepared with Dragon dictation along with smaller phrase technology. Any transcriptional errors that result from this process are unintentional.

## 2015-10-20 ENCOUNTER — Inpatient Hospital Stay: Payer: Medicaid Other

## 2015-10-20 DIAGNOSIS — J90 Pleural effusion, not elsewhere classified: Secondary | ICD-10-CM | POA: Insufficient documentation

## 2015-10-20 DIAGNOSIS — I509 Heart failure, unspecified: Secondary | ICD-10-CM

## 2015-10-20 LAB — GLUCOSE, CAPILLARY
GLUCOSE-CAPILLARY: 108 mg/dL — AB (ref 65–99)
GLUCOSE-CAPILLARY: 124 mg/dL — AB (ref 65–99)
Glucose-Capillary: 164 mg/dL — ABNORMAL HIGH (ref 65–99)
Glucose-Capillary: 133 mg/dL — ABNORMAL HIGH (ref 65–99)
Glucose-Capillary: 176 mg/dL — ABNORMAL HIGH (ref 65–99)
Glucose-Capillary: 145 mg/dL — ABNORMAL HIGH (ref 65–99)

## 2015-10-20 LAB — BASIC METABOLIC PANEL
Anion gap: 7 (ref 5–15)
BUN: 58 mg/dL — ABNORMAL HIGH (ref 6–20)
CALCIUM: 8.2 mg/dL — AB (ref 8.9–10.3)
CO2: 27 mmol/L (ref 22–32)
CREATININE: 4.12 mg/dL — AB (ref 0.44–1.00)
Chloride: 108 mmol/L (ref 101–111)
GFR calc Af Amer: 14 mL/min — ABNORMAL LOW (ref >60)
GFR calc non Af Amer: 12 mL/min — ABNORMAL LOW (ref >60)
GLUCOSE: 161 mg/dL — AB (ref 65–99)
Potassium: 3.7 mmol/L (ref 3.5–5.1)
SODIUM: 142 mmol/L (ref 135–145)

## 2015-10-20 MED ORDER — INSULIN ASPART 100 UNIT/ML ~~LOC~~ SOLN
0.0000 [IU] | Freq: Three times a day (TID) | SUBCUTANEOUS | Status: DC
  Administered 2015-10-20: 3 [IU] via SUBCUTANEOUS
  Administered 2015-10-20: 2 [IU] via SUBCUTANEOUS
  Administered 2015-10-21: 3 [IU] via SUBCUTANEOUS
  Administered 2015-10-21 – 2015-10-22 (×4): 2 [IU] via SUBCUTANEOUS
  Administered 2015-10-23 (×2): 5 [IU] via SUBCUTANEOUS
  Administered 2015-10-23 – 2015-10-24 (×3): 3 [IU] via SUBCUTANEOUS
  Administered 2015-10-24: 5 [IU] via SUBCUTANEOUS
  Administered 2015-10-25 (×2): 2 [IU] via SUBCUTANEOUS
  Administered 2015-10-26: 5 [IU] via SUBCUTANEOUS
  Administered 2015-10-26 (×2): 3 [IU] via SUBCUTANEOUS
  Administered 2015-10-27: 8 [IU] via SUBCUTANEOUS
  Administered 2015-10-27: 5 [IU] via SUBCUTANEOUS
  Administered 2015-10-28: 8 [IU] via SUBCUTANEOUS
  Administered 2015-10-28: 3 [IU] via SUBCUTANEOUS
  Administered 2015-10-28: 11 [IU] via SUBCUTANEOUS
  Administered 2015-10-29: 5 [IU] via SUBCUTANEOUS
  Administered 2015-10-29: 8 [IU] via SUBCUTANEOUS
  Administered 2015-10-30: 3 [IU] via SUBCUTANEOUS
  Administered 2015-10-30: 11 [IU] via SUBCUTANEOUS
  Administered 2015-10-31 (×2): 5 [IU] via SUBCUTANEOUS
  Administered 2015-11-01: 8 [IU] via SUBCUTANEOUS
  Administered 2015-11-01 (×2): 5 [IU] via SUBCUTANEOUS
  Administered 2015-11-02: 8 [IU] via SUBCUTANEOUS
  Administered 2015-11-02: 2 [IU] via SUBCUTANEOUS
  Filled 2015-10-20: qty 11
  Filled 2015-10-20: qty 8
  Filled 2015-10-20: qty 2
  Filled 2015-10-20: qty 3
  Filled 2015-10-20: qty 5
  Filled 2015-10-20: qty 8
  Filled 2015-10-20: qty 5
  Filled 2015-10-20 (×2): qty 3
  Filled 2015-10-20 (×2): qty 5
  Filled 2015-10-20: qty 3
  Filled 2015-10-20: qty 5
  Filled 2015-10-20: qty 8
  Filled 2015-10-20: qty 2
  Filled 2015-10-20: qty 8
  Filled 2015-10-20: qty 2
  Filled 2015-10-20: qty 5
  Filled 2015-10-20: qty 2
  Filled 2015-10-20: qty 3
  Filled 2015-10-20 (×2): qty 2
  Filled 2015-10-20: qty 8
  Filled 2015-10-20: qty 11
  Filled 2015-10-20: qty 5
  Filled 2015-10-20: qty 3
  Filled 2015-10-20: qty 5
  Filled 2015-10-20: qty 2
  Filled 2015-10-20: qty 5
  Filled 2015-10-20: qty 1
  Filled 2015-10-20 (×2): qty 3
  Filled 2015-10-20: qty 2
  Filled 2015-10-20: qty 5
  Filled 2015-10-20: qty 3
  Filled 2015-10-20: qty 5

## 2015-10-20 MED ORDER — INSULIN ASPART 100 UNIT/ML ~~LOC~~ SOLN
0.0000 [IU] | Freq: Every day | SUBCUTANEOUS | Status: DC
  Administered 2015-10-26: 3 [IU] via SUBCUTANEOUS
  Administered 2015-10-27: 5 [IU] via SUBCUTANEOUS
  Administered 2015-10-28: 3 [IU] via SUBCUTANEOUS
  Administered 2015-10-29: 2 [IU] via SUBCUTANEOUS
  Administered 2015-10-30: 3 [IU] via SUBCUTANEOUS
  Administered 2015-10-31: 2 [IU] via SUBCUTANEOUS
  Administered 2015-11-01: 3 [IU] via SUBCUTANEOUS
  Filled 2015-10-20: qty 5
  Filled 2015-10-20: qty 2
  Filled 2015-10-20: qty 1
  Filled 2015-10-20 (×2): qty 3
  Filled 2015-10-20: qty 4
  Filled 2015-10-20: qty 3
  Filled 2015-10-20: qty 2

## 2015-10-20 MED ORDER — ENOXAPARIN SODIUM 30 MG/0.3ML ~~LOC~~ SOLN
30.0000 mg | SUBCUTANEOUS | Status: DC
  Administered 2015-10-20 – 2015-10-22 (×3): 30 mg via SUBCUTANEOUS
  Filled 2015-10-20 (×3): qty 0.3

## 2015-10-20 MED ORDER — FUROSEMIDE 10 MG/ML IJ SOLN
40.0000 mg | Freq: Two times a day (BID) | INTRAMUSCULAR | Status: DC
  Administered 2015-10-20 – 2015-10-22 (×4): 40 mg via INTRAVENOUS
  Filled 2015-10-20 (×4): qty 4

## 2015-10-20 NOTE — Progress Notes (Signed)
Patient said her breathing is getting better but still very weak,PT eval done,persistent generalized edema,lasix therapy in progress

## 2015-10-20 NOTE — Progress Notes (Signed)
Patient ID: Karen Rich, female   DOB: 21-Feb-1976, 39 y.o.   MR[facility]: [id number] [facility]  PCP: Sherrie MustacheFayegh Jadali, MD  HPI/Subjective: Patient feeling very weak. Still short of breath. Some cough.  Objective: Filed Vitals:   10/20/15 0409  BP: 133/63  Pulse: 80  Temp: 98.6 F (37 C)  Resp: 22    Intake/Output Summary (Last 24 hours) at 10/20/15 1031 Last data filed at 10/20/15 1020  Gross per 24 hour  Intake    290 ml  Output   1515 ml  Net  -1225 ml   Filed Weights   10/18/15 0500 10/19/15 0400 10/20/15 0409  Weight: 125.9 kg (277 lb 9 oz) 122.8 kg (270 lb 11.6 oz) 95.437 kg (210 lb 6.4 oz)    ROS: Review of Systems  Constitutional: Negative for fever and chills.  Eyes: Negative for blurred vision.  Respiratory: Positive for cough and shortness of breath.   Cardiovascular: Negative for chest pain.  Gastrointestinal: Negative for nausea, vomiting, abdominal pain, diarrhea and constipation.  Genitourinary: Negative for dysuria.  Musculoskeletal: Negative for joint pain.  Neurological: Negative for dizziness and headaches.   Exam: Physical Exam  Constitutional: She is oriented to person, place, and time.  HENT:  Nose: No mucosal edema.  Mouth/Throat: No oropharyngeal exudate or posterior oropharyngeal edema.  Eyes: Conjunctivae, EOM and lids are normal. Pupils are equal, round, and reactive to light.  Neck: No JVD present. Carotid bruit is not present. No edema present. No thyroid mass and no thyromegaly present.  Cardiovascular: S1 normal and S2 normal.  Exam reveals no gallop.   Murmur heard.  Systolic murmur is present with a grade of 2/6  Pulses:      Dorsalis pedis pulses are 2+ on the right side, and 2+ on the left side.  Respiratory: No respiratory distress. She has no wheezes. She has no rhonchi. She has rales in the right lower field and the left lower field.  GI: Soft. Bowel sounds are normal. She exhibits distension and fluid  wave. There is no tenderness.  Musculoskeletal:       Right ankle: She exhibits swelling.       Left ankle: She exhibits swelling.  Lymphadenopathy:    She has no cervical adenopathy.  Neurological: She is alert and oriented to person, place, and time. No cranial nerve deficit.  Skin: Skin is warm. Nails show no clubbing.  Small skin ulcerations lower extremities.  Psychiatric: She has a normal mood and affect.    Data Reviewed: Basic Metabolic Panel:  Recent Labs Lab 10/15/15 2002 10/16/15 0948 10/17/15 0230 10/18/15 0400 10/19/15 0419 10/20/15 0450  NA 135 140  139 138 138 139 142  K 4.5 4.1  4.1 3.9 3.5 3.5 3.7  CL 106 110  110 112* 106 109 108  CO2 22 24  23 23 22 24 27   GLUCOSE 248* 132*  134* 78 97 83 161*  BUN 50* 52*  52* 52* 52* 53* 58*  CREATININE 3.63* 3.82*  3.80* 3.70* 3.88* 3.92* 4.12*  CALCIUM 8.8* 8.8*  8.9 8.4* 8.7* 8.3* 8.2*  MG 2.1  --  1.9  --   --   --   PHOS  --  5.2* 5.2*  --   --   --    Liver Function Tests:  Recent Labs Lab 10/15/15 2002 10/16/15 0948 10/17/15 0230 10/19/15 0419  AST 31  --   --  23  ALT 31  --   --  29  ALKPHOS 102  --   --  92  BILITOT 0.4  --   --  0.7  PROT 5.7*  --   --  5.5*  ALBUMIN 2.0* 1.9* 1.8* 1.8*    Recent Labs Lab 10/19/15 0839  AMMONIA 28   CBC:  Recent Labs Lab 10/15/15 2002 10/16/15 0948 10/17/15 0230 10/18/15 0400 10/19/15 0419  WBC 7.0 4.7 3.9 8.8 10.5  NEUTROABS  --   --   --   --  8.9*  HGB 8.6* 7.9* 8.4* 8.4* 8.0*  HCT 25.4* 25.2* 26.2* 25.6* 24.5*  MCV 88.6 87.7 86.7 86.3 86.9  PLT 287 298 297 296 309    CBG:  Recent Labs Lab 10/19/15 1642 10/19/15 1958 10/19/15 2335 10/20/15 0407 10/20/15 0753  GLUCAP 111* 136* 168* 164* 133*    Recent Results (from the past 240 hour(s))  C difficile quick scan w PCR reflex     Status: None   Collection Time: 10/10/15 10:21 PM  Result Value Ref Range Status   C Diff antigen NEGATIVE NEGATIVE Final   C Diff toxin NEGATIVE  NEGATIVE Final   C Diff interpretation Negative for C. difficile  Final  C difficile quick scan w PCR reflex     Status: None   Collection Time: 10/12/15 10:35 AM  Result Value Ref Range Status   C Diff antigen NEGATIVE NEGATIVE Final   C Diff toxin NEGATIVE NEGATIVE Final   C Diff interpretation Negative for C. difficile  Final  MRSA PCR Screening     Status: None   Collection Time: 10/17/15  9:02 AM  Result Value Ref Range Status   MRSA by PCR NEGATIVE NEGATIVE Final    Comment:        The GeneXpert MRSA Assay (FDA approved for NASAL specimens only), is one component of a comprehensive MRSA colonization surveillance program. It is not intended to diagnose MRSA infection nor to guide or monitor treatment for MRSA infections.   C difficile quick scan w PCR reflex     Status: None   Collection Time: 10/18/15  5:50 AM  Result Value Ref Range Status   C Diff antigen NEGATIVE NEGATIVE Final   C Diff toxin NEGATIVE NEGATIVE Final   C Diff interpretation Negative for C. difficile  Final     Studies: Ct Head Wo Contrast  10/18/2015  CLINICAL DATA:  Altered mental status.  Lethargy. EXAM: CT HEAD WITHOUT CONTRAST TECHNIQUE: Contiguous axial images were obtained from the base of the skull through the vertex without intravenous contrast. COMPARISON:  None. FINDINGS: Gray-white differentiation is maintained. No CT evidence of acute large territory infarct. No intraparenchymal or extra-axial mass or hemorrhage. Normal size a configuration of the ventricles and basilar cisterns. No midline shift. Limited visualization the paranasal emesis and mastoid air cells is normal. No air-fluid levels. Regional soft tissues appear normal. No displaced calvarial fracture. IMPRESSION: Negative noncontrast head CT. Electronically Signed   By: Simonne Come M.D.   On: 10/18/2015 21:17    Scheduled Meds: . antiseptic oral rinse  7 mL Mouth Rinse BID  . carvedilol  25 mg Oral BID WC  . collagenase   Topical  Daily  . enoxaparin (LOVENOX) injection  40 mg Subcutaneous Q24H  . famotidine  20 mg Oral Daily  . furosemide  80 mg Intravenous BID  . hydrALAZINE  37.5 mg Oral 3 times per day  . insulin aspart  0-9 Units Subcutaneous 6 times per day  . isosorbide  dinitrate  10 mg Oral TID  . metolazone  5 mg Oral Daily  . sodium chloride  10-40 mL Intracatheter Q12H    Assessment/Plan:  1. Acute respiratory failure with hypoxia. Patient is now down to 2 L of nasal cannula. Will check pulse ox tomorrow morning in the a.m. on room air. 2. Acute on chronic combined systolic and diastolic congestive heart failure with anasarca. Patient is on Lasix 80 mg IV twice a day. Continue Coreg. ACE inhibitor once creatinine is stabilized. 3. Acute renal failure on chronic kidney disease stage III. Proteinuria. Nephrology following. Check a BMP daily. Creatinine elevated today 4.12. 4. PEA cardiac arrest. Cardiology wants to do ischemic workup as outpatient 5. Essential hypertension- continue current medications 6. Hypoglycemia with history of type 2 diabetes- on sliding scale at this point  Code Status:     Code Status Orders        Start     Ordered   10/10/15 1940  Full code   Continuous     10/10/15 1939     Disposition Plan: To be determined  Consultants: Cardiology Nephrology Pulmonology  Time spent: 25 minutes  Alford Highland  [facility]

## 2015-10-20 NOTE — Progress Notes (Signed)
No new complaints No distress  Filed Vitals:   10/19/15 1833 10/19/15 1947 10/20/15 0409 10/20/15 1149  BP: 131/68 122/65 133/63 145/60  Pulse: 80 80 80 82  Temp:  97.7 F (36.5 C) 98.6 F (37 C) 99 F (37.2 C)  TempSrc:  Oral Oral Oral  Resp:  20 22 18   Height:      Weight:   95.437 kg (210 lb 6.4 oz)   SpO2:  100% 99% 100%   Physical Exam: General: No acute distress  HEENT [place]/AT EOMI hearing intact  Neck Supple trachea midline  Pulm/lungs Mild bibasilar rales normal effort  CVS/Heart S1S2 no rubs  Abdomen:  +distended, +abdominal wall edema  Extremities: bilateral LE edema       No new CXR  IMPRESSION: Acute respiratory failure Pulmonary edema CKD, AKI Large R pleural effusion by CXR 11/08  PLAN/REC: Recheck CXR Cont diuresis as permitted by renal function and BP  Billy Fischeravid Lacrystal Barbe, MD PCCM service Mobile [phone] Pager [phone]

## 2015-10-20 NOTE — Evaluation (Signed)
Physical Therapy Evaluation Patient Details Name: Karen Rich MRN: [id number] DOB: [birth year] Today's Date: 10/20/2015   History of Present Illness  Pt is a 39 yo female who was admitted to the hospital for acute swelling of the body and SOB. It was attributed to acute CHF. On 10/15/15 pt went into cardiac arrest where she was intubated and sedated. Today she is monitored on telemetry and in NAD.   Clinical Impression  Pt presents with hx of hyperlipidemia, irregular heart beat, HTN, DM, and renal failure. Examination reveals that pt performs bed mobility at mod A, transfers at min A, and ambulation of 10 ft at min A before needing to take a rest secondary to fatigue. Pt's primary physical deficits are decreased activity tolerance and decreased strength. Pt is young and has enough strength that she is not buckling during ambulation or transfers, however, should she lose much more strength she will be at much greater risk of falls, which she already is at a high risk of falls. Due to her deficits she will continue to benefit from skilled PT intervention in order to return her to optimal PLOF and subsequent safe return home. Pt is very pleasant and motivated to participate in therapy tasks.     Follow Up Recommendations SNF    Equipment Recommendations  Rolling walker with 5" wheels    Recommendations for Other Services       Precautions / Restrictions Precautions Precautions: Fall Restrictions Weight Bearing Restrictions: No      Mobility  Bed Mobility Overal bed mobility: Needs Assistance Bed Mobility: Supine to Sit     Supine to sit: Mod assist     General bed mobility comments: Pt requires assist to get into sitting, but once in sitting she is able to hold her balance with bilateral hands on bed.   Transfers Overall transfer level: Needs assistance Equipment used: Rolling walker (2 wheeled) Transfers: Sit to/from Stand Sit to Stand: Min assist         General  transfer comment: Pt with fair functional strength getting herself into standing, however she needs cues to not pull on the walker from the front, but rather to push up from the bed. Pt stable in standing with no buckling or LOB.   Ambulation/Gait Ambulation/Gait assistance: Min assist Ambulation Distance (Feet): 10 Feet (x 3 sets) Assistive device: Rolling walker (2 wheeled) Gait Pattern/deviations: Step-to pattern;Decreased step length - right;Decreased step length - left;Decreased stride length;Shuffle Gait velocity: greatly decreased Gait velocity interpretation: <1.8 ft/sec, indicative of risk for recurrent falls General Gait Details: Pt needs assist for advancement of RW. She also needs cues on sequencing of RW with gait. She states that she feels more stable with RW. Pt ambulated on 2L [place] O2 and her HR ranged from 81 - 89 BPM during and after all ambulation, and her SaO2 never fell below 94%. Pt in NAD during or after ambulation  Stairs            Wheelchair Mobility    Modified Rankin (Stroke Patients Only)       Balance Overall balance assessment: No apparent balance deficits (not formally assessed)                                           Pertinent Vitals/Pain Pain Assessment: No/denies pain    Home Living Family/patient expects to be discharged to:: Private  residence Living Arrangements: Parent Available Help at Discharge: Family;Available 24 hours/day Type of Home: House Home Access: Stairs to enter Entrance Stairs-Rails: None Entrance Stairs-Number of Steps: 2 Home Layout: One level Home Equipment: None      Prior Function Level of Independence: Independent         Comments: Pt indep with ambulation and ADLs. Pt states she was able to get around with no AD, but slowly and with increased rate of breathing     Hand Dominance        Extremity/Trunk Assessment   Upper Extremity Assessment: Overall WFL for tasks assessed            Lower Extremity Assessment: Generalized weakness (Pt 3+/5 gross MMT in bilat LEs)         Communication   Communication: No difficulties  Cognition Arousal/Alertness: Awake/alert Behavior During Therapy: WFL for tasks assessed/performed Overall Cognitive Status: Within Functional Limits for tasks assessed                      General Comments      Exercises Other Exercises Other Exercises: Pt performed bilateral therex x 10 reps at min A for facilitation of movement. Exercises included: ankle pumps, SAQ, glute squeezes, SLR, and hip abd.  Other Exercises: Pt performed 3 sets of 10 ft ambulation at min A from therapist for walker management. Pt stated feeling fatigued "in a good way" from ambulation (reference vitals in ambulation section). Pt provided sitting rest breaks between sets      Assessment/Plan    PT Assessment Patient needs continued PT services  PT Diagnosis Difficulty walking;Abnormality of gait;Generalized weakness   PT Problem List Decreased activity tolerance;Decreased strength;Decreased mobility;Cardiopulmonary status limiting activity  PT Treatment Interventions DME instruction;Gait training;Stair training;Functional mobility training;Therapeutic activities;Therapeutic exercise;Balance training;Neuromuscular re-education   PT Goals (Current goals can be found in the Care Plan section) Acute Rehab PT Goals Patient Stated Goal: none stated PT Goal Formulation: With patient Time For Goal Achievement: 11/03/15 Potential to Achieve Goals: Good    Frequency Min 2X/week   Barriers to discharge        Co-evaluation               End of Session Equipment Utilized During Treatment: Gait belt Activity Tolerance: Patient tolerated treatment well Patient left: in bed;with call bell/phone within reach;with bed alarm set;with family/visitor present Nurse Communication: Mobility status         Time: 1610-9604 PT Time Calculation (min)  (ACUTE ONLY): 25 min   Charges:         PT G CodesBenna Dunks October 23, 2015, 4:05 PM  Benna Dunks, SPT. [phone]

## 2015-10-20 NOTE — Progress Notes (Signed)
Enoxaparin Dosing Adjustment  Dose adjusted to 30 mg SQ daily for CrCl <30 ml/min per policy  Demetrius Charityeldrin D. Miski Feldpausch, PharmD

## 2015-10-20 NOTE — Progress Notes (Signed)
Subjective:  Pt seen at bedside. Cr continues to rise at present.  Chest xray improved from prior.  States shes breathing comfortably.  Objective:  Vital signs in last 24 hours:  Temp:  [97.7 F (36.5 C)-99 F (37.2 C)] 99 F (37.2 C) (11/12 1149) Pulse Rate:  [79-82] 82 (11/12 1149) Resp:  [18-23] 18 (11/12 1149) BP: (122-145)/(60-69) 145/60 mmHg (11/12 1149) SpO2:  [95 %-100 %] 100 % (11/12 1149) Weight:  [95.437 kg (210 lb 6.4 oz)] 95.437 kg (210 lb 6.4 oz) (11/12 0409)  Weight change: -27.363 kg (-60 lb 5.2 oz) Filed Weights   10/18/15 0500 10/19/15 0400 10/20/15 0409  Weight: 125.9 kg (277 lb 9 oz) 122.8 kg (270 lb 11.6 oz) 95.437 kg (210 lb 6.4 oz)    Intake/Output:    Intake/Output Summary (Last 24 hours) at 10/20/15 1351 Last data filed at 10/20/15 1020  Gross per 24 hour  Intake    290 ml  Output   1365 ml  Net  -1075 ml     Physical Exam: General: No acute distress  HEENT [place]/AT EOMI hearing intact  Neck Supple trachea midline  Pulm/lungs Mild bibasilar rales normal effort  CVS/Heart S1S2 no rubs  Abdomen:  +distended, +abdominal wall edema  Extremities: Tight 1+ b/l LE edema  Neurologic: Alert, oriented x 3, follows comamnds  Skin: Excoriations on b/l LE's noted          Basic Metabolic Panel:   Recent Labs Lab 10/15/15 2002 10/16/15 0948 10/17/15 0230 10/18/15 0400 10/19/15 0419 10/20/15 0450  NA 135 140  139 138 138 139 142  K 4.5 4.1  4.1 3.9 3.5 3.5 3.7  CL 106 110  110 112* 106 109 108  CO2 22 24  23 23 22 24 27   GLUCOSE 248* 132*  134* 78 97 83 161*  BUN 50* 52*  52* 52* 52* 53* 58*  CREATININE 3.63* 3.82*  3.80* 3.70* 3.88* 3.92* 4.12*  CALCIUM 8.8* 8.8*  8.9 8.4* 8.7* 8.3* 8.2*  MG 2.1  --  1.9  --   --   --   PHOS  --  5.2* 5.2*  --   --   --      CBC:  Recent Labs Lab 10/15/15 2002 10/16/15 0948 10/17/15 0230 10/18/15 0400 10/19/15 0419  WBC 7.0 4.7 3.9 8.8 10.5  NEUTROABS  --   --   --   --  8.9*  HGB  8.6* 7.9* 8.4* 8.4* 8.0*  HCT 25.4* 25.2* 26.2* 25.6* 24.5*  MCV 88.6 87.7 86.7 86.3 86.9  PLT 287 298 297 296 309      Microbiology:  Recent Results (from the past 720 hour(s))  C difficile quick scan w PCR reflex     Status: None   Collection Time: 10/10/15 10:21 PM  Result Value Ref Range Status   C Diff antigen NEGATIVE NEGATIVE Final   C Diff toxin NEGATIVE NEGATIVE Final   C Diff interpretation Negative for C. difficile  Final  C difficile quick scan w PCR reflex     Status: None   Collection Time: 10/12/15 10:35 AM  Result Value Ref Range Status   C Diff antigen NEGATIVE NEGATIVE Final   C Diff toxin NEGATIVE NEGATIVE Final   C Diff interpretation Negative for C. difficile  Final  MRSA PCR Screening     Status: None   Collection Time: 10/17/15  9:02 AM  Result Value Ref Range Status   MRSA by PCR NEGATIVE  NEGATIVE Final    Comment:        The GeneXpert MRSA Assay (FDA approved for NASAL specimens only), is one component of a comprehensive MRSA colonization surveillance program. It is not intended to diagnose MRSA infection nor to guide or monitor treatment for MRSA infections.   C difficile quick scan w PCR reflex     Status: None   Collection Time: 10/18/15  5:50 AM  Result Value Ref Range Status   C Diff antigen NEGATIVE NEGATIVE Final   C Diff toxin NEGATIVE NEGATIVE Final   C Diff interpretation Negative for C. difficile  Final    Coagulation Studies: No results for input(s): LABPROT, INR in the last 72 hours.  Urinalysis: No results for input(s): COLORURINE, LABSPEC, PHURINE, GLUCOSEU, HGBUR, BILIRUBINUR, KETONESUR, PROTEINUR, UROBILINOGEN, NITRITE, LEUKOCYTESUR in the last 72 hours.  Invalid input(s): APPERANCEUR    Imaging: Dg Chest 2 View  10/20/2015  CLINICAL DATA:  Pleural effusion, weakness, shortness of breath and cough. EXAM: CHEST  2 VIEW COMPARISON:  Chest x-ray dated 10/16/2015. FINDINGS: Endotracheal tube has been removed in the  interval. Right-sided central line remains with tip well-positioned in the expected location of the cavoatrial junction. Mild cardiomegaly is unchanged. There is mild central pulmonary vascular congestion but this appears improved compared to the previous study suggesting improved fluid status. There is significantly improved aeration throughout the right lung. Improved aeration also noted at the left lung base. Small residual opacities at each lung base are probably mild atelectasis. Also suspect small residual left pleural effusion. No new lung abnormality. IMPRESSION: Significant interval improvement as detailed above. Electronically Signed   By: Bary Richard M.D.   On: 10/20/2015 12:29   Ct Head Wo Contrast  10/18/2015  CLINICAL DATA:  Altered mental status.  Lethargy. EXAM: CT HEAD WITHOUT CONTRAST TECHNIQUE: Contiguous axial images were obtained from the base of the skull through the vertex without intravenous contrast. COMPARISON:  None. FINDINGS: Gray-white differentiation is maintained. No CT evidence of acute large territory infarct. No intraparenchymal or extra-axial mass or hemorrhage. Normal size a configuration of the ventricles and basilar cisterns. No midline shift. Limited visualization the paranasal emesis and mastoid air cells is normal. No air-fluid levels. Regional soft tissues appear normal. No displaced calvarial fracture. IMPRESSION: Negative noncontrast head CT. Electronically Signed   By: Simonne Come M.D.   On: 10/18/2015 21:17     Medications:     . antiseptic oral rinse  7 mL Mouth Rinse BID  . carvedilol  25 mg Oral BID WC  . collagenase   Topical Daily  . enoxaparin (LOVENOX) injection  30 mg Subcutaneous Q24H  . furosemide  40 mg Intravenous BID  . hydrALAZINE  37.5 mg Oral 3 times per day  . insulin aspart  0-15 Units Subcutaneous TID WC  . insulin aspart  0-5 Units Subcutaneous QHS  . isosorbide dinitrate  10 mg Oral TID  . metolazone  5 mg Oral Daily  . sodium  chloride  10-40 mL Intracatheter Q12H   sodium chloride, acetaminophen **OR** acetaminophen, hydrALAZINE, loperamide, ondansetron **OR** ondansetron (ZOFRAN) IV, sodium chloride, sodium chloride  Assessment/ Plan:  39 y.o. female with medical problems of Diabetes type II (since Age 29, poorly controlled) with severe complications including retinopathy and kidney disease, hypertension, obesity who was admitted to [facility] on 10/10/2015 for evaluation of acute renal failure and anasarca.  1. 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 Acute  renal failure from acute cardiorenal syndrome from systolic acute congestive heart failure  Echocardiogram from 11/3 with EF of 40-45%. Also with hypotension during cardiac arrest.  Chronic kidney disease with proteinuria (nephrotic range: 4.9 grams) secondary to diabetic nephropathy.  Baseline creatinine of 1.2 in 08/2014. Unclear how much progression of disease. - Cr continues to rise on lasix, may have intravascular volume depletion with whole body volume overload.  Reduce lasix to  IV bid as chest xray did improve.   2. Acute exacerbation of systolic congestive heart failure with anasarca: currently with significant volume overload - reduce lasix to  IV BID given worsening renal status.   3. 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  - continue current insulin regimen.  4. Hypertension: volume driven. BP 145/60, continue  Coreg, hydralazine. -     LOS: 10 Akua Blethen 11/12/20161:51 PM

## 2015-10-20 NOTE — Progress Notes (Signed)
SUBJECTIVE:  Feeling better.  Weak   PHYSICAL EXAM Filed Vitals:   10/19/15 1833 10/19/15 1947 10/20/15 0409 10/20/15 1149  BP: 131/68 122/65 133/63 145/60  Pulse: 80 80 80 82  Temp:  97.7 F (36.5 C) 98.6 F (37 C) 99 F (37.2 C)  TempSrc:  Oral Oral Oral  Resp:  Height:      Weight:   210 lb 6.4 oz (95.437 kg)   SpO2:  100% 99% 100%   General:  Chronically ill, no acute distress Lungs:  Decreased breath sounds Heart:  RRR Abdomen:  Mildly distended Extremities:  Severe edema  LABS:  Results for orders placed or performed during the hospital encounter of 10/10/15 (from the past 24 hour(s))  Glucose, capillary     Status: Abnormal   Collection Time: 10/19/15  4:42 PM  Result Value Ref Range   Glucose-Capillary 111 (H) 65 - 99 mg/dL  Glucose, capillary     Status: Abnormal   Collection Time: 10/19/15  7:58 PM  Result Value Ref Range   Glucose-Capillary 136 (H) 65 - 99 mg/dL   Comment 1 Notify RN   Glucose, capillary     Status: Abnormal   Collection Time: 10/19/15 11:35 PM  Result Value Ref Range   Glucose-Capillary 168 (H) 65 - 99 mg/dL   Comment 1 Notify RN   Glucose, capillary     Status: Abnormal   Collection Time: 10/20/15  4:07 AM  Result Value Ref Range   Glucose-Capillary 164 (H) 65 - 99 mg/dL   Comment 1 Notify RN   Basic metabolic panel     Status: Abnormal   Collection Time: 10/20/15  4:50 AM  Result Value Ref Range   Sodium 142 135 - 145 mmol/L   Potassium 3.7 3.5 - 5.1 mmol/L   Chloride 108 101 - 111 mmol/L   CO2 27 22 - 32 mmol/L   Glucose, Bld 161 (H) 65 - 99 mg/dL   BUN 58 (H) 6 - 20 mg/dL   Creatinine, Ser 4.09 (H) 0.44 - 1.00 mg/dL   Calcium 8.2 (L) 8.9 - 10.3 mg/dL   GFR calc non Af Amer 12 (L) >60 mL/min   GFR calc Af Amer 14 (L) >60 mL/min   Anion gap 7 5 - 15  Glucose, capillary     Status: Abnormal   Collection Time: 10/20/15  7:53 AM  Result Value Ref Range   Glucose-Capillary 133 (H) 65 - 99 mg/dL  Glucose,  capillary     Status: Abnormal   Collection Time: 10/20/15 11:51 AM  Result Value Ref Range   Glucose-Capillary 176 (H) 65 - 99 mg/dL    Intake/Output Summary (Last 24 hours) at 10/20/15 1207 Last data filed at 10/20/15 1020  Gross per 24 hour  Intake    290 ml  Output   1365 ml  Net  -1075 ml     ASSESSMENT AND PLAN:  ACUTE ON CHRONIC SYSTOLIC AND DIASTOLIC HF:    Extubated.   Need accurate I/Os and daily weights.    Continue current IV Lasix and Zaroxolyn.    If intake and output are to be believed she is 16 literes negative since admit.   Weight is down some 38 lbs.  Degree of anasarca not likely secondary to HF.  Possibly CKD and nephrotic syndrome.   CKD STAGE III:    Creat up slightly today.  Follow closely.    PEA:  Plan out patient work  up for possible ischemia.  Thought possibly to be primarily a respiratory event.    HTN:   BP is controlled.     Fayrene FearingJames Keefe Memorial Hospitalochrein 10/20/2015 12:07 PM

## 2015-10-21 ENCOUNTER — Inpatient Hospital Stay: Payer: Medicaid Other

## 2015-10-21 DIAGNOSIS — R601 Generalized edema: Secondary | ICD-10-CM

## 2015-10-21 LAB — URINALYSIS COMPLETE WITH MICROSCOPIC (ARMC ONLY)
BILIRUBIN URINE: NEGATIVE
GLUCOSE, UA: 50 mg/dL — AB
KETONES UR: NEGATIVE mg/dL
NITRITE: NEGATIVE
PH: 5 (ref 5.0–8.0)
PROTEIN: 100 mg/dL — AB
Specific Gravity, Urine: 1.012 (ref 1.005–1.030)

## 2015-10-21 LAB — BASIC METABOLIC PANEL
ANION GAP: 6 (ref 5–15)
BUN: 59 mg/dL — ABNORMAL HIGH (ref 6–20)
CALCIUM: 8.2 mg/dL — AB (ref 8.9–10.3)
CO2: 26 mmol/L (ref 22–32)
CREATININE: 4.12 mg/dL — AB (ref 0.44–1.00)
Chloride: 108 mmol/L (ref 101–111)
GFR, EST AFRICAN AMERICAN: 14 mL/min — AB (ref >60)
GFR, EST NON AFRICAN AMERICAN: 12 mL/min — AB (ref >60)
GLUCOSE: 144 mg/dL — AB (ref 65–99)
Potassium: 3.6 mmol/L (ref 3.5–5.1)
Sodium: 140 mmol/L (ref 135–145)

## 2015-10-21 LAB — GLUCOSE, CAPILLARY
GLUCOSE-CAPILLARY: 174 mg/dL — AB (ref 65–99)
GLUCOSE-CAPILLARY: 121 mg/dL — AB (ref 65–99)
GLUCOSE-CAPILLARY: 150 mg/dL — AB (ref 65–99)
Glucose-Capillary: 129 mg/dL — ABNORMAL HIGH (ref 65–99)

## 2015-10-21 NOTE — Progress Notes (Signed)
Patient's sister Cherylann Banas(Stepheny) called to talk to the RN and demanded patient to be moved to ICU. The RN indicated no intervention was done when rapid response was called and that patient became responsive herself. The sister also demanded to talk to the MD and game the RN her number. Dr. Anne HahnWillis was notified of the family's concern and he indicated he was going to call her.  Patient is now talkative. Will continue to monitor the patient.

## 2015-10-21 NOTE — Progress Notes (Signed)
The RN walked to the patient's room to give  her medications, called the patient and received no responds ( unresponsive). Sternal rub was done several times and patient still did not respond. Rapid respond was called. Patient became responsive after rapid respond team came in. CBG and   V/s sign done and documented as seen on the flow sheet.

## 2015-10-21 NOTE — Progress Notes (Signed)
Patient ID: Karen Rich, female   DOB: [birth year], 39 y.o.   MRN: [id number] [facility] PROGRESS NOTE  PCP: Sherrie Mustache, MD  HPI/Subjective: Patient feeling very weak. Patient having some hoarseness with her voice. Still with shortness of breath and cough.  Objective: Filed Vitals:   10/21/15 1132  BP: 146/69  Pulse: 87  Temp: 98.7 F (37.1 C)  Resp: 18    Filed Weights   10/19/15 0400 10/20/15 0409 10/21/15 0403  Weight: 122.8 kg (270 lb 11.6 oz) 95.437 kg (210 lb 6.4 oz) 95.346 kg (210 lb 3.2 oz)    ROS: Review of Systems  Constitutional: Positive for malaise/fatigue. Negative for fever and chills.  Eyes: Negative for blurred vision.  Respiratory: Positive for cough and shortness of breath.   Cardiovascular: Negative for chest pain.  Gastrointestinal: Negative for nausea, vomiting, abdominal pain, diarrhea and constipation.  Genitourinary: Negative for dysuria.  Musculoskeletal: Negative for joint pain.  Neurological: Negative for dizziness and headaches.   Exam: Physical Exam  Constitutional: She is oriented to person, place, and time.  HENT:  Nose: No mucosal edema.  Mouth/Throat: No oropharyngeal exudate or posterior oropharyngeal edema.  Eyes: Conjunctivae, EOM and lids are normal. Pupils are equal, round, and reactive to light.  Neck: No JVD present. Carotid bruit is not present. No edema present. No thyroid mass and no thyromegaly present.  Cardiovascular: S1 normal and S2 normal.  Exam reveals no gallop.   Murmur heard.  Systolic murmur is present with a grade of 2/6  Pulses:      Dorsalis pedis pulses are 2+ on the right side, and 2+ on the left side.  Respiratory: No respiratory distress. She has no wheezes. She has no rhonchi. She has rales in the right lower field and the left lower field.  GI: Soft. Bowel sounds are normal. She exhibits distension and fluid wave. There is no tenderness.  Musculoskeletal:       Right ankle: She  exhibits swelling.       Left ankle: She exhibits swelling.  Lymphadenopathy:    She has no cervical adenopathy.  Neurological: She is alert and oriented to person, place, and time. No cranial nerve deficit.  Skin: Skin is warm. Nails show no clubbing.  Small skin ulcerations lower extremities.  Psychiatric: She has a normal mood and affect.    Data Reviewed: Basic Metabolic Panel:  Recent Labs Lab 10/15/15 2002 10/16/15 0948 10/17/15 0230 10/18/15 0400 10/19/15 0419 10/20/15 0450 10/21/15 0540  NA 135 140  139 138 138 139 142 140  K 4.5 4.1  4.1 3.9 3.5 3.5 3.7 3.6  CL 106 110  110 112* 106 109 108 108  CO2 GLUCOSE 248* 132*  134* 78 97 83 161* 144*  BUN 50* 52*  52* 52* 52* 53* 58* 59*  CREATININE 3.63* 3.82*  3.80* 3.70* 3.88* 3.92* 4.12* 4.12*  CALCIUM 8.8* 8.8*  8.9 8.4* 8.7* 8.3* 8.2* 8.2*  MG 2.1  --  1.9  --   --   --   --   PHOS  --  5.2* 5.2*  --   --   --   --    Liver Function Tests:  Recent Labs Lab 10/15/15 2002 10/16/15 0948 10/17/15 0230 10/19/15 0419  AST 31  --   --  23  ALT 31  --   --  29  ALKPHOS 102  --   --  92  BILITOT 0.4  --   --  0.7  PROT 5.7*  --   --  5.5*  ALBUMIN 2.0* 1.9* 1.8* 1.8*    Recent Labs Lab 10/19/15 0839  AMMONIA 28   CBC:  Recent Labs Lab 10/15/15 2002 10/16/15 0948 10/17/15 0230 10/18/15 0400 10/19/15 0419  WBC 7.0 4.7 3.9 8.8 10.5  NEUTROABS  --   --   --   --  8.9*  HGB 8.6* 7.9* 8.4* 8.4* 8.0*  HCT 25.4* 25.2* 26.2* 25.6* 24.5*  MCV 88.6 87.7 86.7 86.3 86.9  PLT 287 298 297 296 309    CBG:  Recent Labs Lab 10/20/15 1717 10/20/15 1954 10/20/15 2220 10/21/15 0754 10/21/15 1133  GLUCAP 145* 108* 124* 129* 174*    Recent Results (from the past 240 hour(s))  C difficile quick scan w PCR reflex     Status: None   Collection Time: 10/12/15 10:35 AM  Result Value Ref Range Status   C Diff antigen NEGATIVE NEGATIVE Final   C Diff toxin NEGATIVE NEGATIVE  Final   C Diff interpretation Negative for C. difficile  Final  MRSA PCR Screening     Status: None   Collection Time: 10/17/15  9:02 AM  Result Value Ref Range Status   MRSA by PCR NEGATIVE NEGATIVE Final    Comment:        The GeneXpert MRSA Assay (FDA approved for NASAL specimens only), is one component of a comprehensive MRSA colonization surveillance program. It is not intended to diagnose MRSA infection nor to guide or monitor treatment for MRSA infections.   C difficile quick scan w PCR reflex     Status: None   Collection Time: 10/18/15  5:50 AM  Result Value Ref Range Status   C Diff antigen NEGATIVE NEGATIVE Final   C Diff toxin NEGATIVE NEGATIVE Final   C Diff interpretation Negative for C. difficile  Final     Studies: Dg Chest 2 View  10/20/2015  CLINICAL DATA:  Pleural effusion, weakness, shortness of breath and cough. EXAM: CHEST  2 VIEW COMPARISON:  Chest x-ray dated 10/16/2015. FINDINGS: Endotracheal tube has been removed in the interval. Right-sided central line remains with tip well-positioned in the expected location of the cavoatrial junction. Mild cardiomegaly is unchanged. There is mild central pulmonary vascular congestion but this appears improved compared to the previous study suggesting improved fluid status. There is significantly improved aeration throughout the right lung. Improved aeration also noted at the left lung base. Small residual opacities at each lung base are probably mild atelectasis. Also suspect small residual left pleural effusion. No new lung abnormality. IMPRESSION: Significant interval improvement as detailed above. Electronically Signed   By: Bary Sladen Plancarte M.D.   On: 10/20/2015 12:29    Scheduled Meds: . antiseptic oral rinse  7 mL Mouth Rinse BID  . carvedilol  25 mg Oral BID WC  . collagenase   Topical Daily  . enoxaparin (LOVENOX) injection  30 mg Subcutaneous Q24H  . furosemide  40 mg Intravenous BID  . hydrALAZINE  37.5 mg  Oral 3 times per day  . insulin aspart  0-15 Units Subcutaneous TID WC  . insulin aspart  0-5 Units Subcutaneous QHS  . isosorbide dinitrate  10 mg Oral TID  . metolazone  5 mg Oral Daily  . sodium chloride  10-40 mL Intracatheter Q12H    Assessment/Plan:  1. Acute respiratory failure with hypoxia. Patient is now down to 2 L of nasal  cannula.  2. Acute on chronic combined systolic and diastolic congestive heart failure with anasarca. Patient is on Lasix 40 mg IV twice a day. Continue Coreg. ACE inhibitor once creatinine is stabilized. 3. Acute renal failure on chronic kidney disease stage III. Proteinuria. Likely nephrotic syndrome. Nephrology following. Check a BMP daily. Creatinine  today 4.12. 4. PEA cardiac arrest. Cardiology wants to do ischemic workup as outpatient 5. Essential hypertension- continue current medications 6. Hypoglycemia with history of type 2 diabetes- on sliding scale at this point  Code Status:     Code Status Orders        Start     Ordered   10/10/15 1940  Full code   Continuous     10/10/15 1939     Disposition Plan: To be determined  Consultants: Cardiology Nephrology Pulmonology  Time spent: 22 minutes  Alford HighlandWIETING, Lelani Garnett  [facility]

## 2015-10-21 NOTE — Progress Notes (Signed)
    SUBJECTIVE:  Feeling better.  Very weak   PHYSICAL EXAM Filed Vitals:   10/20/15 0409 10/20/15 1149 10/20/15 1953 10/21/15 0403  BP: 133/63 145/60 127/59 146/62  Pulse: 80 82 87 90  Temp: 98.6 F (37 C) 99 F (37.2 C) 98.3 F (36.8 C) 98.6 F (37 C)  TempSrc: Oral Oral Oral Oral  Resp: 22 18 22 22   Height:      Weight: 210 lb 6.4 oz (95.437 kg)   210 lb 3.2 oz (95.346 kg)  SpO2: 99% 100% 96% 100%   General:  Chronically ill, no acute distress Lungs:  Decreased breath sounds Heart:  RRR Abdomen:  Mildly distended Extremities:  Severe edema  LABS:  Results for orders placed or performed during the hospital encounter of 10/10/15 (from the past 24 hour(s))  Glucose, capillary     Status: Abnormal   Collection Time: 10/20/15 11:51 AM  Result Value Ref Range   Glucose-Capillary 176 (H) 65 - 99 mg/dL  Glucose, capillary     Status: Abnormal   Collection Time: 10/20/15  5:17 PM  Result Value Ref Range   Glucose-Capillary 145 (H) 65 - 99 mg/dL  Glucose, capillary     Status: Abnormal   Collection Time: 10/20/15  7:54 PM  Result Value Ref Range   Glucose-Capillary 108 (H) 65 - 99 mg/dL   Comment 1 Notify RN   Glucose, capillary     Status: Abnormal   Collection Time: 10/20/15 10:20 PM  Result Value Ref Range   Glucose-Capillary 124 (H) 65 - 99 mg/dL   Comment 1 Notify RN   Basic metabolic panel     Status: Abnormal   Collection Time: 10/21/15  5:40 AM  Result Value Ref Range   Sodium 140 135 - 145 mmol/L   Potassium 3.6 3.5 - 5.1 mmol/L   Chloride 108 101 - 111 mmol/L   CO2 26 22 - 32 mmol/L   Glucose, Bld 144 (H) 65 - 99 mg/dL   BUN 59 (H) 6 - 20 mg/dL   Creatinine, Ser 1.614.12 (H) 0.44 - 1.00 mg/dL   Calcium 8.2 (L) 8.9 - 10.3 mg/dL   GFR calc non Af Amer 12 (L) >60 mL/min   GFR calc Af Amer 14 (L) >60 mL/min   Anion gap 6 5 - 15  Glucose, capillary     Status: Abnormal   Collection Time: 10/21/15  7:54 AM  Result Value Ref Range   Glucose-Capillary 129 (H)  65 - 99 mg/dL    Intake/Output Summary (Last 24 hours) at 10/21/15 1042 Last data filed at 10/21/15 0948  Gross per 24 hour  Intake    720 ml  Output   2025 ml  Net  -1305 ml     ASSESSMENT AND PLAN:  ACUTE ON CHRONIC SYSTOLIC AND DIASTOLIC HF:    She seems to be tolerating the current diuretic.  She still has massive edema.   Continue current IV Lasix and Zaroxolyn.   Degree of anasarca not likely secondary to HF.  Possibly CKD and nephrotic syndrome.   Need to keep legs elevated.   CKD STAGE III:    Creat up stable today.  Follow closely.    PEA:  Plan out patient work up for possible ischemia.  Thought possibly to be primarily a respiratory event.    HTN:   BP is controlled.     Fayrene FearingJames [facility] 10/21/2015 10:42 AM

## 2015-10-21 NOTE — Progress Notes (Signed)
Responded to Rapid Response. Pt in bed on 6L [place] with O2 sat of 100%. Awake and responding. Reduced O2 to 2L [place] and O2 sat remained 100%. Hr 79. Pt reports that she is having no respiratory distress and doesn't appear to be in distress.

## 2015-10-21 NOTE — Progress Notes (Signed)
R pleural effusion is much improved Still has mild edema pattern on CXR No new recommendations PCCM will sign off. Please call if we can be of further assistance  Billy Fischeravid Woodard Perrell, MD PCCM service Mobile [phone] Pager [phone]

## 2015-10-21 NOTE — Progress Notes (Signed)
Subjective:  BUN/Cr about the same today. UOP good at 2 liters over the past 24 hours however.    Objective:  Vital signs in last 24 hours:  Temp:  [98.3 F (36.8 C)-98.7 F (37.1 C)] 98.7 F (37.1 C) (11/13 1132) Pulse Rate:  [87-90] 87 (11/13 1132) Resp:  [18-22] 18 (11/13 1132) BP: (127-146)/(59-69) 146/69 mmHg (11/13 1132) SpO2:  [96 %-100 %] 100 % (11/13 1132) Weight:  [95.346 kg (210 lb 3.2 oz)] 95.346 kg (210 lb 3.2 oz) (11/13 0403)  Weight change: -0.091 kg (-3.2 oz) Filed Weights   10/19/15 0400 10/20/15 0409 10/21/15 0403  Weight: 122.8 kg (270 lb 11.6 oz) 95.437 kg (210 lb 6.4 oz) 95.346 kg (210 lb 3.2 oz)    Intake/Output:    Intake/Output Summary (Last 24 hours) at 10/21/15 1315 Last data filed at 10/21/15 0948  Gross per 24 hour  Intake    720 ml  Output   2025 ml  Net  -1305 ml     Physical Exam: General: No acute distress  HEENT [place]/AT EOMI hearing intact  Neck Supple trachea midline  Pulm/lungs Mild bibasilar rales normal effort  CVS/Heart S1S2 no rubs  Abdomen:  +distended, +abdominal wall edema  Extremities: Tight 1+ b/l LE edema  Neurologic: Alert, oriented x 3, follows comamnds  Skin: Excoriations on b/l LE's noted          Basic Metabolic Panel:   Recent Labs Lab 10/15/15 2002 10/16/15 0948 10/17/15 0230 10/18/15 0400 10/19/15 0419 10/20/15 0450 10/21/15 0540  NA 135 140  139 138 138 139 142 140  K 4.5 4.1  4.1 3.9 3.5 3.5 3.7 3.6  CL 106 110  110 112* 106 109 108 108  CO2 GLUCOSE 248* 132*  134* 78 97 83 161* 144*  BUN 50* 52*  52* 52* 52* 53* 58* 59*  CREATININE 3.63* 3.82*  3.80* 3.70* 3.88* 3.92* 4.12* 4.12*  CALCIUM 8.8* 8.8*  8.9 8.4* 8.7* 8.3* 8.2* 8.2*  MG 2.1  --  1.9  --   --   --   --   PHOS  --  5.2* 5.2*  --   --   --   --      CBC:  Recent Labs Lab 10/15/15 2002 10/16/15 0948 10/17/15 0230 10/18/15 0400 10/19/15 0419  WBC 7.0 4.7 3.9 8.8 10.5  NEUTROABS  --   --    --   --  8.9*  HGB 8.6* 7.9* 8.4* 8.4* 8.0*  HCT 25.4* 25.2* 26.2* 25.6* 24.5*  MCV 88.6 87.7 86.7 86.3 86.9  PLT 287 298 297 296 309      Microbiology:  Recent Results (from the past 720 hour(s))  C difficile quick scan w PCR reflex     Status: None   Collection Time: 10/10/15 10:21 PM  Result Value Ref Range Status   C Diff antigen NEGATIVE NEGATIVE Final   C Diff toxin NEGATIVE NEGATIVE Final   C Diff interpretation Negative for C. difficile  Final  C difficile quick scan w PCR reflex     Status: None   Collection Time: 10/12/15 10:35 AM  Result Value Ref Range Status   C Diff antigen NEGATIVE NEGATIVE Final   C Diff toxin NEGATIVE NEGATIVE Final   C Diff interpretation Negative for C. difficile  Final  MRSA PCR Screening     Status: None   Collection Time: 10/17/15  9:02 AM  Result Value Ref Range Status   MRSA by PCR NEGATIVE NEGATIVE Final    Comment:        The GeneXpert MRSA Assay (FDA approved for NASAL specimens only), is one component of a comprehensive MRSA colonization surveillance program. It is not intended to diagnose MRSA infection nor to guide or monitor treatment for MRSA infections.   C difficile quick scan w PCR reflex     Status: None   Collection Time: 10/18/15  5:50 AM  Result Value Ref Range Status   C Diff antigen NEGATIVE NEGATIVE Final   C Diff toxin NEGATIVE NEGATIVE Final   C Diff interpretation Negative for C. difficile  Final    Coagulation Studies: No results for input(s): LABPROT, INR in the last 72 hours.  Urinalysis: No results for input(s): COLORURINE, LABSPEC, PHURINE, GLUCOSEU, HGBUR, BILIRUBINUR, KETONESUR, PROTEINUR, UROBILINOGEN, NITRITE, LEUKOCYTESUR in the last 72 hours.  Invalid input(s): APPERANCEUR    Imaging: Dg Chest 2 View  10/20/2015  CLINICAL DATA:  Pleural effusion, weakness, shortness of breath and cough. EXAM: CHEST  2 VIEW COMPARISON:  Chest x-ray dated 10/16/2015. FINDINGS: Endotracheal tube has been  removed in the interval. Right-sided central line remains with tip well-positioned in the expected location of the cavoatrial junction. Mild cardiomegaly is unchanged. There is mild central pulmonary vascular congestion but this appears improved compared to the previous study suggesting improved fluid status. There is significantly improved aeration throughout the right lung. Improved aeration also noted at the left lung base. Small residual opacities at each lung base are probably mild atelectasis. Also suspect small residual left pleural effusion. No new lung abnormality. IMPRESSION: Significant interval improvement as detailed above. Electronically Signed   By: Bary RichardStan  Maynard M.D.   On: 10/20/2015 12:29     Medications:     . antiseptic oral rinse  7 mL Mouth Rinse BID  . carvedilol  25 mg Oral BID WC  . collagenase   Topical Daily  . enoxaparin (LOVENOX) injection  30 mg Subcutaneous Q24H  . furosemide  40 mg Intravenous BID  . hydrALAZINE  37.5 mg Oral 3 times per day  . insulin aspart  0-15 Units Subcutaneous TID WC  . insulin aspart  0-5 Units Subcutaneous QHS  . isosorbide dinitrate  10 mg Oral TID  . metolazone  5 mg Oral Daily  . sodium chloride  10-40 mL Intracatheter Q12H   sodium chloride, acetaminophen **OR** acetaminophen, hydrALAZINE, loperamide, ondansetron **OR** ondansetron (ZOFRAN) IV, sodium chloride, sodium chloride  Assessment/ Plan:  39 y.o. 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  1. 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 Acute renal failure from acute cardiorenal syndrome from systolic acute congestive heart failure  Echocardiogram from 11/3 with EF of 40-45%. Also with  hypotension during cardiac arrest.  Chronic kidney disease with proteinuria (nephrotic range: 4.9 grams) secondary to diabetic nephropathy.  Baseline creatinine of 1.2 in 08/2014. Unclear how much progression of disease. - Renal function about the same at the moment, Cr 4.12, BUN 59.  Good UOP noted.  No acute indication for dialysis at the moment.  May continue lasix at this time.  2. Acute exacerbation of systolic congestive heart failure with anasarca: currently with significant volume overload - continue lasix 40mg  IV  BID, may transition to PO soon.   3. 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  - continue current insulin regimen.  4. Hypertension: volume driven.  BP 146/69 today, continue hydralazine and coreg.  -     LOS: 11 Shjon Lizarraga 11/13/20161:15 PM

## 2015-10-22 LAB — GLUCOSE, CAPILLARY
GLUCOSE-CAPILLARY: 115 mg/dL — AB (ref 65–99)
Glucose-Capillary: 160 mg/dL — ABNORMAL HIGH (ref 65–99)
Glucose-Capillary: 146 mg/dL — ABNORMAL HIGH (ref 65–99)
Glucose-Capillary: 143 mg/dL — ABNORMAL HIGH (ref 65–99)
Glucose-Capillary: 153 mg/dL — ABNORMAL HIGH (ref 65–99)

## 2015-10-22 LAB — BASIC METABOLIC PANEL
ANION GAP: 6 (ref 5–15)
BUN: 65 mg/dL — ABNORMAL HIGH (ref 6–20)
CALCIUM: 8.2 mg/dL — AB (ref 8.9–10.3)
CO2: 29 mmol/L (ref 22–32)
Chloride: 106 mmol/L (ref 101–111)
Creatinine, Ser: 4.12 mg/dL — ABNORMAL HIGH (ref 0.44–1.00)
GFR, EST AFRICAN AMERICAN: 14 mL/min — AB (ref >60)
GFR, EST NON AFRICAN AMERICAN: 12 mL/min — AB (ref >60)
GLUCOSE: 157 mg/dL — AB (ref 65–99)
Potassium: 3.7 mmol/L (ref 3.5–5.1)
Sodium: 141 mmol/L (ref 135–145)

## 2015-10-22 LAB — PROTEIN ELECTROPHORESIS, SERUM
A/G RATIO SPE: 0.6 — AB (ref 0.7–1.7)
Albumin ELP: 2 g/dL — ABNORMAL LOW (ref 2.9–4.4)
Alpha-1-Globulin: 0.4 g/dL (ref 0.0–0.4)
Alpha-2-Globulin: 0.8 g/dL (ref 0.4–1.0)
BETA GLOBULIN: 1 g/dL (ref 0.7–1.3)
GLOBULIN, TOTAL: 3.6 g/dL (ref 2.2–3.9)
Gamma Globulin: 1.4 g/dL (ref 0.4–1.8)
TOTAL PROTEIN ELP: 5.6 g/dL — AB (ref 6.0–8.5)

## 2015-10-22 MED ORDER — HYDRALAZINE HCL 50 MG PO TABS
50.0000 mg | ORAL_TABLET | Freq: Three times a day (TID) | ORAL | Status: DC
  Administered 2015-10-22 – 2015-10-29 (×19): 50 mg via ORAL
  Filled 2015-10-22 (×19): qty 1

## 2015-10-22 MED ORDER — ISOSORBIDE MONONITRATE ER 30 MG PO TB24
60.0000 mg | ORAL_TABLET | Freq: Every day | ORAL | Status: DC
  Administered 2015-10-22 – 2015-11-02 (×11): 60 mg via ORAL
  Filled 2015-10-22 (×4): qty 1
  Filled 2015-10-22: qty 2
  Filled 2015-10-22 (×6): qty 1

## 2015-10-22 NOTE — Progress Notes (Signed)
Nutrition Follow-up    INTERVENTION:  Meals and snacks: Cater to pt preferences, may need to consider carb modified diet order.  Medical Nutrition Supplement Therapy: If unable to meet nutritional needs will add supplement   NUTRITION DIAGNOSIS:   Inadequate oral intake related to acute illness as evidenced by NPO status, improving as on po diet and eating well    GOAL:   Patient will meet greater than or equal to 90% of their needs    MONITOR:    (Energy Intake, Anthropometrics, Electrolyte/Renal Profile)  REASON FOR ASSESSMENT:   Diagnosis    ASSESSMENT:       Current Nutrition: eating 80-100% of meals and tolerating well.    Gastrointestinal Profile: Last BM:    Scheduled Medications:  . antiseptic oral rinse  7 mL Mouth Rinse BID  . carvedilol  25 mg Oral BID WC  . collagenase   Topical Daily  . enoxaparin (LOVENOX) injection  30 mg Subcutaneous Q24H  . hydrALAZINE  50 mg Oral 3 times per day  . insulin aspart  0-15 Units Subcutaneous TID WC  . insulin aspart  0-5 Units Subcutaneous QHS  . isosorbide mononitrate  60 mg Oral Daily  . sodium chloride  10-40 mL Intracatheter Q12H       Electrolyte/Renal Profile and Glucose Profile:   Recent Labs Lab 10/15/15 2002 10/16/15 0948 10/17/15 0230  10/20/15 0450 10/21/15 0540 10/22/15 0520  NA 135 140  139 138  < > 142 140 141  K 4.5 4.1  4.1 3.9  < > 3.7 3.6 3.7  CL 106 110  110 112*  < > 108 108 106  CO2 22 24  23 23   < > 27 26 29   BUN 50* 52*  52* 52*  < > 58* 59* 65*  CREATININE 3.63* 3.82*  3.80* 3.70*  < > 4.12* 4.12* 4.12*  CALCIUM 8.8* 8.8*  8.9 8.4*  < > 8.2* 8.2* 8.2*  MG 2.1  --  1.9  --   --   --   --   PHOS  --  5.2* 5.2*  --   --   --   --   GLUCOSE 248* 132*  134* 78  < > 161* 144* 157*  < > = values in this interval not displayed. Protein Profile:  Recent Labs Lab 10/16/15 0948 10/17/15 0230 10/19/15 0419  ALBUMIN 1.9* 1.8* 1.8*       Weight Trend since  Admission: Filed Weights   10/20/15 0409 10/21/15 0403 10/22/15 0459  Weight: 210 lb 6.4 oz (95.437 kg) 210 lb 3.2 oz (95.346 kg) 210 lb 8 oz (95.482 kg)       Diet Order:  Diet renal with fluid restriction Fluid restriction:: 1200 mL Fluid; Room service appropriate?: Yes; Fluid consistency:: Thin  Skin:   reviewed   Height:   Ht Readings from Last 1 Encounters:  10/10/15 5' 3.5" (1.613 m)    Weight:   Wt Readings from Last 1 Encounters:  10/22/15 210 lb 8 oz (95.482 kg)    Ideal Body Weight:     BMI:  Body mass index is 36.7 kg/(m^2).  Estimated Nutritional Needs:   Kcal:  1210-1375 kcals using IBW 55 kg (22-25 kcals/kg) per ASPEN guidelines  Protein:  138 g (2.5 g/kg)   Fluid:  1375-1650 mL (25-30 ml/kg)   EDUCATION NEEDS:   Education needs addressed  LOW Care Level  Onelia Cadmus B. Freida BusmanAllen, RD, LDN [phone] (pager)

## 2015-10-22 NOTE — Progress Notes (Signed)
Patient has 6 beat of v-tach , DR Wietin g was made aware no order at this time continue to monitor

## 2015-10-22 NOTE — Progress Notes (Signed)
Subjective:   renal function overall remains quite poor. Creatinine currently 4.1.  urine output good at 1.9 L.  she reports that her edema has improved.   Objective:  Vital signs in last 24 hours:  Temp:  [98.1 F (36.7 C)-98.6 F (37 C)] 98.6 F (37 C) (11/14 1200) Pulse Rate:  [83-89] 83 (11/14 1200) Resp:  [18-22] 18 (11/14 1200) BP: (138-159)/(63-83) 140/68 mmHg (11/14 1200) SpO2:  [96 %-100 %] 98 % (11/14 1200) Weight:  [95.482 kg (210 lb 8 oz)] 95.482 kg (210 lb 8 oz) (11/14 0459)  Weight change: 0.136 kg (4.8 oz) Filed Weights   10/20/15 0409 10/21/15 0403 10/22/15 0459  Weight: 95.437 kg (210 lb 6.4 oz) 95.346 kg (210 lb 3.2 oz) 95.482 kg (210 lb 8 oz)    Intake/Output:    Intake/Output Summary (Last 24 hours) at 10/22/15 1508 Last data filed at 10/22/15 1347  Gross per 24 hour  Intake    480 ml  Output   1950 ml  Net  -1470 ml     Physical Exam: General: No acute distress  HEENT [place]/AT EOMI hearing intact  Neck Supple trachea midline  Pulm/lungs Mild bibasilar rales normal effort  CVS/Heart S1S2 no rubs  Abdomen:  +distended, +abdominal wall edema  Extremities: Tight 1+ b/l LE edema  Neurologic: Alert, oriented x 3, follows comamnds  Skin: Excoriations on b/l LE's noted          Basic Metabolic Panel:   Recent Labs Lab 10/15/15 2002 10/16/15 0948 10/17/15 0230 10/18/15 0400 10/19/15 0419 10/20/15 0450 10/21/15 0540 10/22/15 0520  NA 135 140  139 138 138 139 142 140 141  K 4.5 4.1  4.1 3.9 3.5 3.5 3.7 3.6 3.7  CL 106 110  110 112* 106 109 108 108 106  CO2 GLUCOSE 248* 132*  134* 78 97 83 161* 144* 157*  BUN 50* 52*  52* 52* 52* 53* 58* 59* 65*  CREATININE 3.63* 3.82*  3.80* 3.70* 3.88* 3.92* 4.12* 4.12* 4.12*  CALCIUM 8.8* 8.8*  8.9 8.4* 8.7* 8.3* 8.2* 8.2* 8.2*  MG 2.1  --  1.9  --   --   --   --   --   PHOS  --  5.2* 5.2*  --   --   --   --   --      CBC:  Recent Labs Lab 10/15/15 2002  10/16/15 0948 10/17/15 0230 10/18/15 0400 10/19/15 0419  WBC 7.0 4.7 3.9 8.8 10.5  NEUTROABS  --   --   --   --  8.9*  HGB 8.6* 7.9* 8.4* 8.4* 8.0*  HCT 25.4* 25.2* 26.2* 25.6* 24.5*  MCV 88.6 87.7 86.7 86.3 86.9  PLT 287 298 297 296 309      Microbiology:  Recent Results (from the past 720 hour(s))  C difficile quick scan w PCR reflex     Status: None   Collection Time: 10/10/15 10:21 PM  Result Value Ref Range Status   C Diff antigen NEGATIVE NEGATIVE Final   C Diff toxin NEGATIVE NEGATIVE Final   C Diff interpretation Negative for C. difficile  Final  C difficile quick scan w PCR reflex     Status: None   Collection Time: 10/12/15 10:35 AM  Result Value Ref Range Status   C Diff antigen NEGATIVE NEGATIVE Final   C Diff toxin NEGATIVE NEGATIVE Final   C Diff interpretation Negative  for C. difficile  Final  MRSA PCR Screening     Status: None   Collection Time: 10/17/15  9:02 AM  Result Value Ref Range Status   MRSA by PCR NEGATIVE NEGATIVE Final    Comment:        The GeneXpert MRSA Assay (FDA approved for NASAL specimens only), is one component of a comprehensive MRSA colonization surveillance program. It is not intended to diagnose MRSA infection nor to guide or monitor treatment for MRSA infections.   C difficile quick scan w PCR reflex     Status: None   Collection Time: 10/18/15  5:50 AM  Result Value Ref Range Status   C Diff antigen NEGATIVE NEGATIVE Final   C Diff toxin NEGATIVE NEGATIVE Final   C Diff interpretation Negative for C. difficile  Final    Coagulation Studies: No results for input(s): LABPROT, INR in the last 72 hours.  Urinalysis:  Recent Labs  10/21/15 1413  COLORURINE YELLOW*  LABSPEC 1.012  PHURINE 5.0  GLUCOSEU 50*  HGBUR 1+*  BILIRUBINUR NEGATIVE  KETONESUR NEGATIVE  PROTEINUR 100*  NITRITE NEGATIVE  LEUKOCYTESUR 3+*      Imaging: [place] Renal  10/21/2015  CLINICAL DATA:  39 year old female with acute renal failure.  EXAM: RENAL / URINARY TRACT ULTRASOUND COMPLETE COMPARISON:  10/10/2015 and prior ultrasounds FINDINGS: Right Kidney: Length: 11.0 cm. Increased renal echogenicity noted. There is no evidence of hydronephrosis or solid renal mass. Left Kidney: Length: 11.9 cm. Increased renal echogenicity noted. There is no evidence of hydronephrosis or solid renal mass. An 8 mm echogenic focus within the lower left kidney could represent a nonobstructing calculus. Bladder: A Foley catheter is noted within a collapsed bladder. IMPRESSION: Increased renal echogenicity compatible with medical renal disease. No evidence of hydronephrosis. Possible 8 mm nonobstructing left lower pole renal calculus. Foley catheter within the bladder. Electronically Signed   By: Harmon PierJeffrey  Hu M.D.   On: 10/21/2015 14:54     Medications:     . antiseptic oral rinse  7 mL Mouth Rinse BID  . carvedilol  25 mg Oral BID WC  . collagenase   Topical Daily  . enoxaparin (LOVENOX) injection  30 mg Subcutaneous Q24H  . furosemide  40 mg Intravenous BID  . hydrALAZINE  50 mg Oral 3 times per day  . insulin aspart  0-15 Units Subcutaneous TID WC  . insulin aspart  0-5 Units Subcutaneous QHS  . isosorbide mononitrate  60 mg Oral Daily  . metolazone  5 mg Oral Daily  . sodium chloride  10-40 mL Intracatheter Q12H   sodium chloride, acetaminophen **OR** acetaminophen, hydrALAZINE, loperamide, ondansetron **OR** ondansetron (ZOFRAN) IV, sodium chloride, sodium chloride  Assessment/ Plan:  39 y.o. female with medical problems of Diabetes type II (since Age 39, poorly controlled) with severe complications including retinopathy and kidney disease, hypertension, obesity who was [facility] for evaluation of acute renal failure and anasarca.  1. 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 Acute renal failure from acute cardiorenal syndrome from systolic acute  congestive heart failure  Echocardiogram from 11/3 with EF of 40-45%. Also with hypotension during cardiac arrest.  Chronic kidney disease with proteinuria (nephrotic range: 4.9 grams) secondary to diabetic nephropathy.  Baseline creatinine of 1.2 in 08/2014. Unclear how much progression of disease. - we continue to suspect that the patient does have advanced underlying chronic kidney disease.   There is a component of  acute renal failure as well. We will hold Lasix for the next day or 2 to see if this has an impact upon her renal function. Thereafter we will transition her to by mouth Lasix.  2. Acute exacerbation of systolic congestive heart failure with anasarca: currently with significant volume overload -  We will hold Lasix for a day or 2 as above to see if this affects her renal function.  3. 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  - continue current insulin regimen.  4. Hypertension: volume driven.   Blood pressure found to be 140/68. Continue hydralazine and carvedilol at the current doses for now. -     LOS: 12 Raye Slyter 11/14/20163:08 PM

## 2015-10-22 NOTE — Progress Notes (Signed)
Physical Therapy Treatment Patient Details Name: Karen Rich MRN: [id number] DOB: [birth year] Today's Date: 10/22/2015    History of Present Illness Pt is a 39 yo female who was admitted to the hospital for acute swelling of the body and SOB. It was attributed to acute CHF. On 10/15/15 pt went into cardiac arrest where she was intubated and sedated. Today she is monitored on telemetry and in NAD.     PT Comments      Pt making improvements in her strength and endurance with greater ambulation volume tolerated. It appears her strength is improving with bed mobility and transfers, however she continues to have significant mobility deficits secondary to her being grossly deconditioned. She will continue to benefit from skilled PT in order to return to optimal PLOF. Pt is very pleasant and eager to participate in therapy.          Follow Up Recommendations  SNF     Equipment Recommendations  Rolling walker with 5" wheels    Recommendations for Other Services       Precautions / Restrictions Precautions Precautions: Fall Restrictions Weight Bearing Restrictions: No    Mobility  Bed Mobility Overal bed mobility: Needs Assistance Bed Mobility: Supine to Sit     Supine to sit: Min assist     General bed mobility comments: Requring less assist this morning, however pt still needing assist to get her trunk all the way upright at EOB.  Transfers Overall transfer level: Needs assistance Equipment used: Rolling walker (2 wheeled) Transfers: Sit to/from Stand Sit to Stand: Min assist         General transfer comment: Pt still needing min assist for getting all the way upright. She is performing better with less cues prior to transfers  Ambulation/Gait Ambulation/Gait assistance: Min assist Ambulation Distance (Feet): 10 Feet (x 3 sets) Assistive device: Rolling walker (2 wheeled) Gait Pattern/deviations: Step-to pattern;Decreased step length - right;Decreased step  length - left;Decreased stride length;Shuffle Gait velocity: greatly decreased Gait velocity interpretation: <1.8 ft/sec, indicative of risk for recurrent falls General Gait Details: Pt still requiring assist for managment of RW. Ambulation occured on 3L O2 [place] and SaO2 never fell below 93%. Pt with only minor dyspnea but pt stated that she felt fine   Stairs            Wheelchair Mobility    Modified Rankin (Stroke Patients Only)       Balance Overall balance assessment: No apparent balance deficits (not formally assessed)                                  Cognition Arousal/Alertness: Awake/alert Behavior During Therapy: WFL for tasks assessed/performed Overall Cognitive Status: Within Functional Limits for tasks assessed                      Exercises Other Exercises Other Exercises: Pt performed bilateral therex x 12 reps at min A for facilitation of movement. Exercises included: ankle pumps, SAQ, glute squeezes, SLR, and hip abd.  Other Exercises: Pt performed 4 sets of 10 ft ambulation at min A from therapist for walker management. Instructed on appropriate transfer technique as well as sequencing of RW with gait. Pt provided sitting rest breaks between sets    General Comments        Pertinent Vitals/Pain Pain Assessment: No/denies pain    Home Living  Prior Function            PT Goals (current goals can now be found in the care plan section) Acute Rehab PT Goals PT Goal Formulation: With patient Time For Goal Achievement: 11/03/15 Potential to Achieve Goals: Good Progress towards PT goals: Progressing toward goals    Frequency  Min 2X/week    PT Plan Current plan remains appropriate    Co-evaluation             End of Session Equipment Utilized During Treatment: Gait belt Activity Tolerance: Patient tolerated treatment well Patient left: in chair;with call bell/phone within reach;with chair  alarm set;with family/visitor present     Time: 1100-1126 PT Time Calculation (min) (ACUTE ONLY): 26 min  Charges:                       G CodesBenna Dunks 11/21/2015, 3:29 PM  Benna Dunks, SPT. [phone]

## 2015-10-22 NOTE — Progress Notes (Signed)
Patient ID: Karen Rich, female   DOB: [birth year], 39 y.o.   MRN: [id number] [facility] PROGRESS NOTE  PCP: Sherrie Mustache, MD  HPI/Subjective: Patient feeling a little bit better today. Still feels very weak. Voice is still very hoarse.  Objective: Filed Vitals:   10/22/15 1200  BP: 140/68  Pulse: 83  Temp: 98.6 F (37 C)  Resp: 18    Filed Weights   10/20/15 0409 10/21/15 0403 10/22/15 0459  Weight: 95.437 kg (210 lb 6.4 oz) 95.346 kg (210 lb 3.2 oz) 95.482 kg (210 lb 8 oz)    ROS: Review of Systems  Constitutional: Positive for malaise/fatigue. Negative for fever and chills.  Eyes: Negative for blurred vision.  Respiratory: Positive for cough and shortness of breath.   Cardiovascular: Negative for chest pain.  Gastrointestinal: Negative for nausea, vomiting, abdominal pain, diarrhea and constipation.  Genitourinary: Negative for dysuria.  Musculoskeletal: Negative for joint pain.  Neurological: Negative for dizziness and headaches.   Exam: Physical Exam  Constitutional: She is oriented to person, place, and time.  HENT:  Nose: No mucosal edema.  Mouth/Throat: No oropharyngeal exudate or posterior oropharyngeal edema.  Eyes: Conjunctivae, EOM and lids are normal. Pupils are equal, round, and reactive to light.  Neck: No JVD present. Carotid bruit is not present. No edema present. No thyroid mass and no thyromegaly present.  Cardiovascular: S1 normal and S2 normal.  Exam reveals no gallop.   Murmur heard.  Systolic murmur is present with a grade of 2/6  Pulses:      Dorsalis pedis pulses are 2+ on the right side, and 2+ on the left side.  Respiratory: No respiratory distress. She has no wheezes. She has no rhonchi. She has rales in the right lower field and the left lower field.  GI: Soft. Bowel sounds are normal. She exhibits distension and fluid wave. There is no tenderness.  Musculoskeletal:       Right ankle: She exhibits swelling.       Left  ankle: She exhibits swelling.  Lymphadenopathy:    She has no cervical adenopathy.  Neurological: She is alert and oriented to person, place, and time. No cranial nerve deficit.  Skin: Skin is warm. Nails show no clubbing.  Small skin ulcerations lower extremities.  Psychiatric: She has a normal mood and affect.    Data Reviewed: Basic Metabolic Panel:  Recent Labs Lab 10/15/15 2002 10/16/15 0948 10/17/15 0230 10/18/15 0400 10/19/15 0419 10/20/15 0450 10/21/15 0540 10/22/15 0520  NA 135 140  139 138 138 139 142 140 141  K 4.5 4.1  4.1 3.9 3.5 3.5 3.7 3.6 3.7  CL 106 110  110 112* 106 109 108 108 106  CO2 GLUCOSE 248* 132*  134* 78 97 83 161* 144* 157*  BUN 50* 52*  52* 52* 52* 53* 58* 59* 65*  CREATININE 3.63* 3.82*  3.80* 3.70* 3.88* 3.92* 4.12* 4.12* 4.12*  CALCIUM 8.8* 8.8*  8.9 8.4* 8.7* 8.3* 8.2* 8.2* 8.2*  MG 2.1  --  1.9  --   --   --   --   --   PHOS  --  5.2* 5.2*  --   --   --   --   --    Liver Function Tests:  Recent Labs Lab 10/15/15 2002 10/16/15 0948 10/17/15 0230 10/19/15 0419  AST 31  --   --  23  ALT 31  --   --  29  ALKPHOS 102  --   --  92  BILITOT 0.4  --   --  0.7  PROT 5.7*  --   --  5.5*  ALBUMIN 2.0* 1.9* 1.8* 1.8*    Recent Labs Lab 10/19/15 0839  AMMONIA 28   CBC:  Recent Labs Lab 10/15/15 2002 10/16/15 0948 10/17/15 0230 10/18/15 0400 10/19/15 0419  WBC 7.0 4.7 3.9 8.8 10.5  NEUTROABS  --   --   --   --  8.9*  HGB 8.6* 7.9* 8.4* 8.4* 8.0*  HCT 25.4* 25.2* 26.2* 25.6* 24.5*  MCV 88.6 87.7 86.7 86.3 86.9  PLT 287 298 297 296 309    CBG:  Recent Labs Lab 10/21/15 1635 10/21/15 2107 10/21/15 2207 10/22/15 0754 10/22/15 1201  GLUCAP 121* 150* 160* 146* 143*    Recent Results (from the past 240 hour(s))  MRSA PCR Screening     Status: None   Collection Time: 10/17/15  9:02 AM  Result Value Ref Range Status   MRSA by PCR NEGATIVE NEGATIVE Final    Comment:        The  GeneXpert MRSA Assay (FDA approved for NASAL specimens only), is one component of a comprehensive MRSA colonization surveillance program. It is not intended to diagnose MRSA infection nor to guide or monitor treatment for MRSA infections.   C difficile quick scan w PCR reflex     Status: None   Collection Time: 10/18/15  5:50 AM  Result Value Ref Range Status   C Diff antigen NEGATIVE NEGATIVE Final   C Diff toxin NEGATIVE NEGATIVE Final   C Diff interpretation Negative for C. difficile  Final     Studies: US Renal  10/21/2015  CLINICAL DATA:  39 year old female with acute renal failure. EXAM: RENAL / URINARY TRACT ULTRASOUND COMPLETE COMPARISON:  10/10/2015 and prior ultrasounds FINDINGS: Right Kidney: Length: 11.0 cm. Increased renal echogenicity noted. There is no evidence of hydronephrosis or solid renal mass. Left Kidney: Length: 11.9 cm. Increased renal echogenicity noted. There is no evidence of hydronephrosis or solid renal mass. An 8 mm echogenic focus within the lower left kidney could represent a nonobstructing calculus. Bladder: A Foley catheter is noted within a collapsed bladder. IMPRESSION: Increased renal echogenicity compatible with medical renal disease. No evidence of hydronephrosis. Possible 8 mm nonobstructing left lower pole renal calculus. Foley catheter within the bladder. Electronically Signed   By: Harmon Pier M.D.   On: 10/21/2015 14:54    Scheduled Meds: . antiseptic oral rinse  7 mL Mouth Rinse BID  . carvedilol  25 mg Oral BID WC  . collagenase   Topical Daily  . enoxaparin (LOVENOX) injection  30 mg Subcutaneous Q24H  . hydrALAZINE  50 mg Oral 3 times per day  . insulin aspart  0-15 Units Subcutaneous TID WC  . insulin aspart  0-5 Units Subcutaneous QHS  . isosorbide mononitrate  60 mg Oral Daily  . sodium chloride  10-40 mL Intracatheter Q12H    Assessment/Plan:  1. Acute respiratory failure with hypoxia. Patient is now down to 2 L of nasal  cannula. Check pulse ox on room air in a.m. 2. Acute on chronic combined systolic and diastolic congestive heart failure with anasarca. Nephrology would like to give the Lasix holiday at this point to see if creatinine improves. Continue Coreg. ACE inhibitor once creatinine is stabilized. 3. Acute renal failure on chronic kidney disease stage III. Proteinuria. Likely nephrotic syndrome. Nephrology following. Check a BMP daily.  Creatinine  today 4.12. Nephrology once to give her Lasix holiday to see if creatinine improves. Discontinue Foley catheter in a.m. 4. PEA cardiac arrest. Cardiology wants to do ischemic workup as outpatient 5. Essential hypertension- continue current medications 6. Hypoglycemia with history of type 2 diabetes- on sliding scale at this point  Code Status:     Code Status Orders        Start     Ordered   10/10/15 1940  Full code   Continuous     10/10/15 1939     Disposition Plan: Potentially rehabilitation versus home with home health depending on progression. We'll need to seek kidney function improved prior to disposition.  Consultants: Cardiology Nephrology Pulmonology  Time spent: 20 minutes  Alford HighlandWIETING, Cezar Misiaszek  [facility]

## 2015-10-22 NOTE — Care Management (Signed)
Patient has transferred out of icu back to 2A.  Physical therapy has recommended short term nursing placement due to the significance of her weakness.  she at this time would also require round the clock nursing assessment due to the nature of disease process and potential for fast decline in respiratory status.   Before transfer to icu, patient and this cm had dicussed return home with home health services.  Patient is in agreement to have a bed search initiated.  Referral to CSW

## 2015-10-22 NOTE — Progress Notes (Deleted)
Physical Therapy Treatment Patient Details Name: Karen PellegriniCamille Y Rich MRN: [id number] DOB: 1976-10-31 Today's Date: 10/22/2015    History of Present Illness Pt is a 39 yo female who was admitted to the hospital for acute swelling of the body and SOB. It was attributed to acute CHF. On 10/15/15 pt went into cardiac arrest where she was intubated and sedated. Today she is monitored on telemetry and in NAD.     PT Comments    Pt making improvements in her strength and endurance with greater ambulation volume tolerated. It appears her strength is improving with bed mobility and transfers, however she continues to have significant mobility deficits secondary to her being grossly deconditioned. She will continue to benefit from skilled PT in order to return to optimal PLOF. Pt is very pleasant and eager to participate in therapy.   Follow Up Recommendations  SNF     Equipment Recommendations  Rolling walker with 5" wheels    Recommendations for Other Services       Precautions / Restrictions Precautions Precautions: Fall Restrictions Weight Bearing Restrictions: No    Mobility  Bed Mobility Overal bed mobility: Needs Assistance Bed Mobility: Supine to Sit     Supine to sit: Min assist     General bed mobility comments: Requring less assist this morning, however pt still needing assist to get her trunk all the way upright at EOB.  Transfers Overall transfer level: Needs assistance Equipment used: Rolling walker (2 wheeled) Transfers: Sit to/from Stand Sit to Stand: Min assist         General transfer comment: Pt still needing min assist for getting all the way upright. She is performing better with less cues prior to transfers  Ambulation/Gait Ambulation/Gait assistance: Min assist Ambulation Distance (Feet): 10 Feet (x 3 sets) Assistive device: Rolling walker (2 wheeled) Gait Pattern/deviations: Step-to pattern;Decreased step length - right;Decreased step length -  left;Decreased stride length;Shuffle Gait velocity: greatly decreased Gait velocity interpretation: <1.8 ft/sec, indicative of risk for recurrent falls General Gait Details: Pt still requiring assist for managment of RW. Ambulation occured on 3L O2 [place] and SaO2 never fell below 93%. Pt with only minor dyspnea but pt stated that she felt fine   Stairs            Wheelchair Mobility    Modified Rankin (Stroke Patients Only)       Balance Overall balance assessment: No apparent balance deficits (not formally assessed)                                  Cognition Arousal/Alertness: Awake/alert Behavior During Therapy: WFL for tasks assessed/performed Overall Cognitive Status: Within Functional Limits for tasks assessed                      Exercises Other Exercises Other Exercises: Pt performed bilateral therex x 12 reps at min A for facilitation of movement. Exercises included: ankle pumps, SAQ, glute squeezes, SLR, and hip abd.  Other Exercises: Pt performed 4 sets of 10 ft ambulation at min A from therapist for walker management. Instructed on appropriate transfer technique as well as sequencing of RW with gait. Pt provided sitting rest breaks between sets    General Comments        Pertinent Vitals/Pain Pain Assessment: No/denies pain    Home Living  Prior Function            PT Goals (current goals can now be found in the care plan section) Acute Rehab PT Goals PT Goal Formulation: With patient Time For Goal Achievement: 11/03/15 Potential to Achieve Goals: Good Progress towards PT goals: Progressing toward goals    Frequency  Min 2X/week    PT Plan Current plan remains appropriate    Co-evaluation             End of Session Equipment Utilized During Treatment: Gait belt Activity Tolerance: Patient tolerated treatment well Patient left: in chair;with call bell/phone within reach;with chair alarm  set;with family/visitor present     Time:  -     Charges:                       G CodesBenna Dunks 10-28-15, 1:23 PM  Benna Dunks, SPT. [phone]

## 2015-10-22 NOTE — Progress Notes (Signed)
SUBJECTIVE:  Feeling better.  Very weak. Not much activities so far. Weight is down to 210 lbs.    PHYSICAL EXAM Filed Vitals:   10/21/15 1941 10/21/15 2206 10/21/15 2356 10/22/15 0459  BP: 157/83 156/80 138/70 155/63  Pulse: 89 89 88 87  Temp: 98.6 F (37 C)   98.2 F (36.8 C)  TempSrc: Oral     Resp: 22   20  Height:      Weight:    210 lb 8 oz (95.482 kg)  SpO2: 100% 96% 100% 100%   General:  Chronically ill, no acute distress Lungs:  Decreased breath sounds Heart:  RRR Abdomen:  Mildly distended Extremities: moderate edema  LABS:  Results for orders placed or performed during the hospital encounter of 10/10/15 (from the past 24 hour(s))  Glucose, capillary     Status: Abnormal   Collection Time: 10/21/15 11:33 AM  Result Value Ref Range   Glucose-Capillary 174 (H) 65 - 99 mg/dL  Urinalysis complete, with microscopic ([facility] only)     Status: Abnormal   Collection Time: 10/21/15  2:13 PM  Result Value Ref Range   Color, Urine YELLOW (A) YELLOW   APPearance HAZY (A) CLEAR   Glucose, UA 50 (A) NEGATIVE mg/dL   Bilirubin Urine NEGATIVE NEGATIVE   Ketones, ur NEGATIVE NEGATIVE mg/dL   Specific Gravity, Urine 1.012 1.005 - 1.030   Hgb urine dipstick 1+ (A) NEGATIVE   pH 5.0 5.0 - 8.0   Protein, ur 100 (A) NEGATIVE mg/dL   Nitrite NEGATIVE NEGATIVE   Leukocytes, UA 3+ (A) NEGATIVE   RBC / HPF TOO NUMEROUS TO COUNT 0 - 5 RBC/hpf   WBC, UA TOO NUMEROUS TO COUNT 0 - 5 WBC/hpf   Bacteria, UA RARE (A) NONE SEEN   Squamous Epithelial / LPF 0-5 (A) NONE SEEN   WBC Clumps PRESENT    Mucous PRESENT    Hyaline Casts, UA PRESENT   Glucose, capillary     Status: Abnormal   Collection Time: 10/21/15  4:35 PM  Result Value Ref Range   Glucose-Capillary 121 (H) 65 - 99 mg/dL  Glucose, capillary     Status: Abnormal   Collection Time: 10/21/15  9:07 PM  Result Value Ref Range   Glucose-Capillary 150 (H) 65 - 99 mg/dL   Comment 1 Notify RN   Glucose, capillary     Status:  Abnormal   Collection Time: 10/21/15 10:07 PM  Result Value Ref Range   Glucose-Capillary 160 (H) 65 - 99 mg/dL   Comment 1 Notify RN   Basic metabolic panel     Status: Abnormal   Collection Time: 10/22/15  5:20 AM  Result Value Ref Range   Sodium 141 135 - 145 mmol/L   Potassium 3.7 3.5 - 5.1 mmol/L   Chloride 106 101 - 111 mmol/L   CO2 29 22 - 32 mmol/L   Glucose, Bld 157 (H) 65 - 99 mg/dL   BUN 65 (H) 6 - 20 mg/dL   Creatinine, Ser 1.61 (H) 0.44 - 1.00 mg/dL   Calcium 8.2 (L) 8.9 - 10.3 mg/dL   GFR calc non Af Amer 12 (L) >60 mL/min   GFR calc Af Amer 14 (L) >60 mL/min   Anion gap 6 5 - 15  Glucose, capillary     Status: Abnormal   Collection Time: 10/22/15  7:54 AM  Result Value Ref Range   Glucose-Capillary 146 (H) 65 - 99 mg/dL    Intake/Output Summary (  Last 24 hours) at 10/22/15 0816 Last data filed at 10/22/15 0459  Gross per 24 hour  Intake      0 ml  Output   1950 ml  Net  -1950 ml     ASSESSMENT AND PLAN:  ACUTE ON CHRONIC SYSTOLIC AND DIASTOLIC HF:    She seems to be tolerating the current diuretic.  Edeam is improving. Current weight is down to 210 lbs. Prior weight in March of 2016 was 180 lbs.    Continue current IV Lasix and Zaroxolyn.   Degree of anasarca not likely secondary to HF.  Possibly CKD and nephrotic syndrome.     CKD STAGE III:    Creat up stable today.  Follow closely.    PEA:  Plan out patient work up for possible ischemia.  Thought possibly to be primarily a respiratory event.    HTN:   BP is not controlled. I increased Hydralazine to 50 mg q 8 hours and changed Isordil to Imdur 60 mg daily.   Agree with PT. She might require inpatient rehab due to degree with deconditioning.   Lorine BearsMuhammad Ladasha Schnackenberg 10/22/2015 8:16 AM

## 2015-10-23 DIAGNOSIS — N179 Acute kidney failure, unspecified: Secondary | ICD-10-CM | POA: Insufficient documentation

## 2015-10-23 DIAGNOSIS — R601 Generalized edema: Secondary | ICD-10-CM | POA: Insufficient documentation

## 2015-10-23 LAB — BASIC METABOLIC PANEL
ANION GAP: 9 (ref 5–15)
BUN: 70 mg/dL — ABNORMAL HIGH (ref 6–20)
CO2: 26 mmol/L (ref 22–32)
Calcium: 8.6 mg/dL — ABNORMAL LOW (ref 8.9–10.3)
Chloride: 103 mmol/L (ref 101–111)
Creatinine, Ser: 4.16 mg/dL — ABNORMAL HIGH (ref 0.44–1.00)
GFR, EST AFRICAN AMERICAN: 14 mL/min — AB (ref >60)
GFR, EST NON AFRICAN AMERICAN: 12 mL/min — AB (ref >60)
GLUCOSE: 163 mg/dL — AB (ref 65–99)
POTASSIUM: 3.5 mmol/L (ref 3.5–5.1)
Sodium: 138 mmol/L (ref 135–145)

## 2015-10-23 LAB — KAPPA/LAMBDA LIGHT CHAINS
Kappa free light chain: 149.42 mg/L — ABNORMAL HIGH (ref 3.30–19.40)
Kappa, lambda light chain ratio: 1.22 (ref 0.26–1.65)
Lambda free light chains: 122.15 mg/L — ABNORMAL HIGH (ref 5.71–26.30)

## 2015-10-23 LAB — C4 COMPLEMENT: COMPLEMENT C4, BODY FLUID: 51 mg/dL — AB (ref 14–44)

## 2015-10-23 LAB — GLOMERULAR BASEMENT MEMBRANE ANTIBODIES: GBM Ab: 4 units (ref 0–20)

## 2015-10-23 LAB — GLUCOSE, CAPILLARY
GLUCOSE-CAPILLARY: 211 mg/dL — AB (ref 65–99)
GLUCOSE-CAPILLARY: 236 mg/dL — AB (ref 65–99)
GLUCOSE-CAPILLARY: 187 mg/dL — AB (ref 65–99)
GLUCOSE-CAPILLARY: 162 mg/dL — AB (ref 65–99)
Glucose-Capillary: 148 mg/dL — ABNORMAL HIGH (ref 65–99)

## 2015-10-23 LAB — BLOOD GAS, ARTERIAL
Acid-Base Excess: 1.4 mmol/L (ref 0.0–3.0)
Allens test (pass/fail): POSITIVE — AB
Bicarbonate: 26.6 mEq/L (ref 21.0–28.0)
FIO2: 32
O2 SAT: 98.9 %
PATIENT TEMPERATURE: 37
PO2 ART: 127 mmHg — AB (ref 83.0–108.0)
pCO2 arterial: 44 mmHg (ref 32.0–48.0)
pH, Arterial: 7.39 (ref 7.350–7.450)

## 2015-10-23 LAB — C3 COMPLEMENT: C3 COMPLEMENT: 170 mg/dL — AB (ref 82–167)

## 2015-10-23 LAB — MPO/PR-3 (ANCA) ANTIBODIES: Myeloperoxidase Abs: <9 U/mL (ref 0.0–9.0)

## 2015-10-23 MED ORDER — FLUTICASONE PROPIONATE 50 MCG/ACT NA SUSP
2.0000 | Freq: Every day | NASAL | Status: DC
  Administered 2015-10-24 – 2015-11-01 (×8): 2 via NASAL
  Filled 2015-10-23: qty 16

## 2015-10-23 MED ORDER — ENOXAPARIN SODIUM 40 MG/0.4ML ~~LOC~~ SOLN
40.0000 mg | SUBCUTANEOUS | Status: DC
  Administered 2015-10-23 – 2015-10-24 (×2): 40 mg via SUBCUTANEOUS
  Filled 2015-10-23 (×2): qty 0.4

## 2015-10-23 NOTE — Progress Notes (Signed)
Subjective:  Lasix was stopped yesterday. Creatinine still remains high at 4.1. Serum sodium normal at 138. Still has lower extremity edema.    Objective:  Vital signs in last 24 hours:  Temp:  [97.6 F (36.4 C)-98.4 F (36.9 C)] 97.6 F (36.4 C) (11/15 1302) Pulse Rate:  [73-87] 73 (11/15 1302) Resp:  [18] 18 (11/15 1302) BP: (109-155)/(47-90) 125/57 mmHg (11/15 1302) SpO2:  [85 %-100 %] 100 % (11/15 1302) Weight:  [94.802 kg (209 lb)] 94.802 kg (209 lb) (11/15 0508)  Weight change: -0.68 kg (-1 lb 8 oz) Filed Weights   10/21/15 0403 10/22/15 0459 10/23/15 0508  Weight: 95.346 kg (210 lb 3.2 oz) 95.482 kg (210 lb 8 oz) 94.802 kg (209 lb)    Intake/Output:    Intake/Output Summary (Last 24 hours) at 10/23/15 1515 Last data filed at 10/23/15 1219  Gross per 24 hour  Intake    490 ml  Output   1050 ml  Net   -560 ml     Physical Exam: General: No acute distress  HEENT Streeter/AT EOMI hearing intact  Neck Supple trachea midline  Pulm/lungs Mild bibasilar rales normal effort  CVS/Heart S1S2 no rubs  Abdomen:  +distended, +abdominal wall edema  Extremities: Tight 1+ b/l LE edema  Neurologic: Alert, oriented x 3, follows comamnds  Skin: Excoriations on b/l LE's noted          Basic Metabolic Panel:   Recent Labs Lab 10/17/15 0230  10/19/15 0419 10/20/15 0450 10/21/15 0540 10/22/15 0520 10/23/15 0600  NA 138  < > 139 142 140 141 138  K 3.9  < > 3.5 3.7 3.6 3.7 3.5  CL 112*  < > 109 108 108 106 103  CO2 23  < > GLUCOSE 78  < > 83 161* 144* 157* 163*  BUN 52*  < > 53* 58* 59* 65* 70*  CREATININE 3.70*  < > 3.92* 4.12* 4.12* 4.12* 4.16*  CALCIUM 8.4*  < > 8.3* 8.2* 8.2* 8.2* 8.6*  MG 1.9  --   --   --   --   --   --   PHOS 5.2*  --   --   --   --   --   --   < > = values in this interval not displayed.   CBC:  Recent Labs Lab 10/17/15 0230 10/18/15 0400 10/19/15 0419  WBC 3.9 8.8 10.5  NEUTROABS  --   --  8.9*  HGB 8.4* 8.4* 8.0*   HCT 26.2* 25.6* 24.5*  MCV 86.7 86.3 86.9  PLT 297 296 309      Microbiology:  Recent Results (from the past 720 hour(s))  C difficile quick scan w PCR reflex     Status: None   Collection Time: 10/10/15 10:21 PM  Result Value Ref Range Status   C Diff antigen NEGATIVE NEGATIVE Final   C Diff toxin NEGATIVE NEGATIVE Final   C Diff interpretation Negative for C. difficile  Final  C difficile quick scan w PCR reflex     Status: None   Collection Time: 10/12/15 10:35 AM  Result Value Ref Range Status   C Diff antigen NEGATIVE NEGATIVE Final   C Diff toxin NEGATIVE NEGATIVE Final   C Diff interpretation Negative for C. difficile  Final  MRSA PCR Screening     Status: None   Collection Time: 10/17/15  9:02 AM  Result Value Ref Range Status   MRSA  by PCR NEGATIVE NEGATIVE Final    Comment:        The GeneXpert MRSA Assay (FDA approved for NASAL specimens only), is one component of a comprehensive MRSA colonization surveillance program. It is not intended to diagnose MRSA infection nor to guide or monitor treatment for MRSA infections.   C difficile quick scan w PCR reflex     Status: None   Collection Time: 10/18/15  5:50 AM  Result Value Ref Range Status   C Diff antigen NEGATIVE NEGATIVE Final   C Diff toxin NEGATIVE NEGATIVE Final   C Diff interpretation Negative for C. difficile  Final    Coagulation Studies: No results for input(s): LABPROT, INR in the last 72 hours.  Urinalysis:  Recent Labs  10/21/15 1413  COLORURINE YELLOW*  LABSPEC 1.012  PHURINE 5.0  GLUCOSEU 50*  HGBUR 1+*  BILIRUBINUR NEGATIVE  KETONESUR NEGATIVE  PROTEINUR 100*  NITRITE NEGATIVE  LEUKOCYTESUR 3+*      Imaging: No results found.   Medications:     . antiseptic oral rinse  7 mL Mouth Rinse BID  . carvedilol  25 mg Oral BID WC  . collagenase   Topical Daily  . enoxaparin (LOVENOX) injection  30 mg Subcutaneous Q24H  . fluticasone  2 spray Each Nare Daily  .  hydrALAZINE  50 mg Oral 3 times per day  . insulin aspart  0-15 Units Subcutaneous TID WC  . insulin aspart  0-5 Units Subcutaneous QHS  . isosorbide mononitrate  60 mg Oral Daily  . sodium chloride  10-40 mL Intracatheter Q12H   sodium chloride, acetaminophen **OR** acetaminophen, hydrALAZINE, loperamide, ondansetron **OR** ondansetron (ZOFRAN) IV, sodium chloride, sodium chloride  Assessment/ Plan:  39 y.o. female with medical problems of Diabetes type II (since Age 39, poorly controlled) with severe complications including retinopathy and kidney disease, hypertension, obesity who [facility] for evaluation of acute renal failure and anasarca.  1. 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 Acute renal failure from acute cardiorenal syndrome from systolic acute congestive heart failure  Echocardiogram from 11/3 with EF of 40-45%. Also with hypotension during cardiac arrest.  Chronic kidney disease with proteinuria (nephrotic range: 4.9 grams) secondary to diabetic nephropathy.  Baseline creatinine of 1.2 in 08/2014. Unclear how much progression of disease. - Lasix currently on hold. No significant improvement in creatinine at present. We will need to monitor renal function trend as well as urine output while she remains off Lasix. However we anticipate to restart by mouth Lasix tomorrow.  2. Acute exacerbation of systolic congestive heart failure with anasarca: currently with significant volume overload -  Lasix currently on hold, however as above we will likely consider restarting this in oral form tomorrow.  3. 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  - continue current insulin regimen.  4. Hypertension: volume driven.   Blood pressure currently 125/57.Marland Kitchen. Continue hydralazine and carvedilol.     LOS: 13 Deaire Mcwhirter 11/15/20163:15  PM

## 2015-10-23 NOTE — Care Management (Signed)
Spoke with primary nurse regarding need for assessment of home 02 in the event that she discharges home and not to a skilled nursing facility.  If she discharges home, would not be eligible for home physical therapy due to her medicaid not covering the service at home.

## 2015-10-23 NOTE — Progress Notes (Signed)
Patient has become very drowsy. Will respond to voice, but is mumbling words. Patient is very unsteady and states she is very dizzy when [facility]. Becoming confused and somewhat impulsive. Different from baseline of patient. Vitals are stable and blood sugar is fine. Dr. Renae GlossWieting notified. Blood gas ordered.

## 2015-10-23 NOTE — Consult Note (Signed)
Karen Rich, Karen Rich [id number] 1975/12/11 Karen Highlandichard Wieting, MD  Reason for Consult: Hoarseness  HPI: 39 year old female with a complex past medical history was admitted to the hospital had a cardiorespiratory arrest was intubated for approximately 3 days was extubated on the 10th of this month since that time she has had persistent hoarseness. She's never had any history of forceps in the past. She hasn't had no hemoptysis does have intermittent coughing.  Allergies: No Known Allergies  ROS: Review of systems normal other than 12 systems except per HPI.  PMH:  Past Medical History  Diagnosis Date  . Edema   . Hyperlipidemia   . Hyperglycemia   . Fatigue   . Irregular heart beat   . Hypertension   . Diabetes mellitus without complication (HCC)   . Renal disorder   . Renal failure     stage 3    FH:  Family History  Problem Relation Age of Onset  . Hypertension Mother   . Diabetes Mother   . Hypertension Father   . CVA Father     SH:  Social History   Social History  . Marital Status: Divorced    Spouse Name: N/A  . Number of Children: N/A  . Years of Education: N/A   Occupational History  . Not on file.   Social History Main Topics  . Smoking status: Never Smoker   . Smokeless tobacco: Not on file  . Alcohol Use: No  . Drug Use: No  . Sexual Activity: Not on file   Other Topics Concern  . Not on file   Social History Narrative    PSH:  Past Surgical History  Procedure Laterality Date  . None      Physical  Exam: Dry mucous membranes nasal cannula in position with dry crusting of each nostril CN 2-12 grossly intact and symmetric. EAC/TMs normal BL. Oral cavity, lips, gums, ororpharynx normal with no masses or lesions. Skin warm and dry. External nose and ears without masses or lesions. EOMI, PERRLA. Neck supple with no masses or lesions. No lymphadenopathy palpated. Thyroid normal with no masses.  procedure: Flexible fiberoptic laryngoscopy-a topical  anesthetic of phenylephrine lidocaine solution was placed within each nostril. After approximately 5 minutes a flexible fiberoptic laryngoscope was introduced into the left nasal cavity the nasopharynx was clear there was significant crusting within each nostril. The tongue base was clear epiglottis was within normal limits. Her vocal cord mobility was intact however the movement was sluggish bilaterally there were some polypoid changes on  the left vocal fold consistent with trauma from intubation. No evidence  of malignancy seen.   assessment: Hoarseness-likely related to intubation trauma this should resolve with time. The sluggishness of the vocal folds is likely related to the multiple medical problems which she had I would like to follow up with her as an outpatient in 3-4 weeks once the edema has resolved. I do think the prednisone would be helpful for this edema but with her diabetes this was certainly raise her blood sugar.   Karen Rich T 10/23/2015 4:54 PM

## 2015-10-23 NOTE — Progress Notes (Signed)
Pharmacy Note - Enoxaparin  Patient with orders for enoxaparin 30mg  SQ Q24H for VTE prophylaxis  Estimated Creatinine Clearance: 19.7 mL/min (by C-G formula based on Cr of 4.16). BMI: 43.3  Will adjust to enoxaparin 40mg  SQ Q24H per anticoagulation policy for CrCl < 2830ml/min with BMI > 40  Garlon HatchetJody Ashmi Blas, PharmD Clinical Pharmacist  10/23/2015 3:31 PM

## 2015-10-23 NOTE — Progress Notes (Signed)
Physical Therapy Treatment Patient Details Name: Karen Rich MRN: [id number] DOB: 09-10-1976 Today's Date: 10/23/2015    History of Present Illness Pt is a 39 yo female who was admitted to the hospital for acute swelling of the body and SOB. It was attributed to acute CHF. On 10/15/15 pt went into cardiac arrest where she was intubated and sedated. Today she is monitored on telemetry and in NAD.     PT Comments    Pt demonstrates worsening drowsiness throughout session. Speech is very soft but mumbling increases as session progresses. Pt becomes lightheaded during ambulation and requires quick return to sitting position in hallway as LEs start to buckle. She recovers within 2-3 minutes and vitals are WNL during event. RN present to witness. Upon return to bed pt bumping into doors, walls, and cabinets. Pt is able to increase ambulation distance and SaO2 remains >90% on 3L/min O2. Upon arrival at room for session pt on room air and SaO2 at 81%. RN notified at end of session that supplemental O2 had been removed when PT arrived. RN notified of patient's worsening drowsiness at end of PT session. Pt will benefit from skilled PT services to address deficits in strength, balance, and mobility in order to return to full function at home.    Follow Up Recommendations  SNF     Equipment Recommendations  Rolling walker with 5" wheels    Recommendations for Other Services       Precautions / Restrictions Precautions Precautions: Fall Restrictions Weight Bearing Restrictions: No    Mobility  Bed Mobility Overal bed mobility: Needs Assistance Bed Mobility: Sit to Supine     Supine to sit: Min assist     General bed mobility comments: Pt moves slowly and requires assist with positiong to scoot up toward HOB.  Transfers Overall transfer level: Needs assistance Equipment used: Rolling walker (2 wheeled) Transfers: Sit to/from Stand Sit to Stand: Min assist         General  transfer comment: Pt still demonstrates decreased LE power. Requires cues for safe hand placement. Pt attempts to pull up on walker. Pt is unsteady on feet and requires cues to stay inside of walker.  Ambulation/Gait Ambulation/Gait assistance: Min assist Ambulation Distance (Feet): 120 Feet (60+60) Assistive device: Rolling walker (2 wheeled) Gait Pattern/deviations: Decreased step length - right;Decreased step length - left;Staggering left;Staggering right Gait velocity: Decreased although improved from prior sessions.  Gait velocity interpretation: <1.8 ft/sec, indicative of risk for recurrent falls General Gait Details: Pt ambulates to RN station. Fatigue and vitals monitored duringa ambulation. SaO2 remains >90% on 3L/min during ambulation. Pt reports mild DOE but becomes increasingly lethargic during gait. Upon arrival at RN station pt attempts to lean on walker. Pt encouraged not to lean on walker and instead leans on desk. She starts to beome lightheaded with buckling in LEs so CNA comes to assist pt quickly into chair. Vitals obtained and are WNL. Pt reports feeling better after 2-3 minutes. Pt able to ambulate back to room but requires continual cues to stay inside walker. Pt staggers left and right and starts to become impulsive and bump into walls. CNA follows with chair for safety. Pt returned to bed   Stairs            Wheelchair Mobility    Modified Rankin (Stroke Patients Only)       Balance Overall balance assessment: Needs assistance   Sitting balance-Leahy Scale: Fair       Standing balance-Leahy  Scale: Poor                      Cognition Arousal/Alertness: Awake/alert Behavior During Therapy: WFL for tasks assessed/performed Overall Cognitive Status: Within Functional Limits for tasks assessed                      Exercises      General Comments        Pertinent Vitals/Pain Pain Assessment: No/denies pain    Home Living                       Prior Function            PT Goals (current goals can now be found in the care plan section) Acute Rehab PT Goals PT Goal Formulation: With patient Time For Goal Achievement: 11/03/15 Potential to Achieve Goals: Good Progress towards PT goals: Progressing toward goals    Frequency  Min 2X/week    PT Plan Current plan remains appropriate    Co-evaluation             End of Session Equipment Utilized During Treatment: Gait belt Activity Tolerance: Patient limited by fatigue;Patient limited by lethargy Patient left: with call bell/phone within reach;with family/visitor present;in bed;with bed alarm set     Time: [id number] PT Time Calculation (min) (ACUTE ONLY): 23 min  Charges:  $Gait Training: 23-37 mins                    G Codes:      Sharalyn InkJason D Raynee Mccasland PT, DPT   Lavone Barrientes 10/23/2015, 12:26 PM

## 2015-10-23 NOTE — Consult Note (Addendum)
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 antiseptic oral rinse  7 mL Mouth Rinse BID  . carvedilol  25 mg Oral BID WC  . collagenase   Topical Daily  . enoxaparin (LOVENOX) injection  30 mg Subcutaneous Q24H  . hydrALAZINE  50 mg Oral 3 times per day  . insulin aspart  0-15 Units Subcutaneous TID WC  . insulin aspart  0-5 Units Subcutaneous QHS  . isosorbide mononitrate  60 mg Oral Daily  . sodium chloride  10-40 mL Intracatheter Q12H    OBJECTIVE  Filed Vitals:   10/22/15 0834 10/22/15 1200 10/22/15 1936 10/23/15 0508  BP: 159/76 140/68 109/71 155/69  Pulse: 85 83 87 85  Temp: 98.1 F (36.7 C) 98.6 F (37 C) 97.9 F (36.6 C) 98.4 F (36.9 C)  TempSrc: Oral Oral Oral Oral  Resp: Height:      Weight:    209 lb (94.802 kg)  SpO2: 99% 98% 98% 100%    Intake/Output Summary (Last 24 hours) at 10/23/15 0809 Last data filed at 10/22/15 1909  Gross per 24 hour  Intake    720 ml  Output   1050 ml  Net   -330 ml   Filed Weights   10/21/15 0403 10/22/15 0459 10/23/15 0508  Weight: 210 lb 3.2 oz (95.346 kg) 210 lb 8 oz (95.482 kg) 209 lb (94.802 kg)    PHYSICAL EXAM Telemetry, maintaining normal sinus rhythm General: Pleasant, NAD. Neuro: Alert and oriented X 3. Moves all extremities spontaneously. Psych: Normal affect. HEENT:  Normal  Neck: Supple without bruits, JVP 10+ Lungs:  Resp regular and mildly labored, diffuse  rails, decreased breath sounds at the bases bilaterally Heart: RRR no s3, s4, or murmurs. Abdomen: Firm, non-tender, non-distended, BS + x 4.  Extremities: 2+ edema.    Basic Metabolic Panel  Recent Labs  10/22/15 0520 10/23/15 0600  NA 141 138  K 3.7 3.5  CL 106 103  CO2 29 26  GLUCOSE 157* 163*  BUN 65* 70*  CREATININE 4.12* 4.16*  CALCIUM 8.2* 8.6*   TELE Maintaining normal sinus rhythm rate 78 bpm   ASSESSMENT AND PLAN  1.  Acute on chronic combined systolic/diastolic chf:  Minus only 330 ml yesterday.  Net neg 20 L since admission.  Wt listed @ 209   2.  PEA arrest:   Thought primarily to be a respiratory event.  Plan for outpt w/u to r/o ischemia.    3.  Essential HTN:   BP trending in 150's for the most part.  Hydral/nitrate adjusted yesterday.  4.  CKD III:   Creat up slightly this AM.  BUN up to 70.  Bicarb stable.  Renal following.  Signed, Nicolasa Ducking NP   Attending Note Patient seen and examined, agree with detailed note above,  Patient presentation and plan discussed on rounds.   Presentation consistent with continued acute on chronic diastolic and systolic CHF Abdomen is  firm, still with shortness of breath, 2+ pitting edema of the legs.   Urine Output dropped off with holding Lasix --Given this, suspect she will need to be continued on Lasix IV, aggressively as she was before . Previously acquired metolazone with Lasix while in the ICU last week  --Assessment of fluid status is difficult as unable to obtain CVP . Potentially could repeat echocardiogram to measure right heart pressures  --- High risk of readmission if she is discharged with CHF symptoms   --Need to also exclude pulmonary embolism as a cause of her PEA, respiratory arrest/event   not a good candidate for chest with contrast, potentially could do VQ scan    Signed: Dossie Arbourim Hasset Chaviano  M.D., Ph.D. [facility]

## 2015-10-23 NOTE — Care Management (Signed)
ENT consult for persistent hoarseness since intubation.  Continue to anticipate need for skilled nursing placement.  Lasix on hold to assess if creatinine improves.

## 2015-10-23 NOTE — Progress Notes (Signed)
Patient ID: Karen Rich, female   DOB: Jan 13, 1976, 39 y.o.   MRN: [id number] [facility] PROGRESS NOTE  PCP: Sherrie Mustache, MD  HPI/Subjective: Patient had nausea and vomiting this morning. Still with hoarse voice. Some cough and some shortness of breath.  Objective: Filed Vitals:   10/23/15 0508  BP: 155/69  Pulse: 85  Temp: 98.4 F (36.9 C)  Resp: 18    Filed Weights   10/21/15 0403 10/22/15 0459 10/23/15 0508  Weight: 95.346 kg (210 lb 3.2 oz) 95.482 kg (210 lb 8 oz) 94.802 kg (209 lb)    ROS: Review of Systems  Constitutional: Positive for malaise/fatigue. Negative for fever and chills.  Eyes: Negative for blurred vision.  Respiratory: Positive for cough and shortness of breath.   Cardiovascular: Negative for chest pain.  Gastrointestinal: Positive for nausea and vomiting. Negative for abdominal pain, diarrhea and constipation.  Genitourinary: Negative for dysuria.  Musculoskeletal: Negative for joint pain.  Neurological: Negative for dizziness and headaches.   Exam: Physical Exam  Constitutional: She is oriented to person, place, and time.  HENT:  Nose: No mucosal edema.  Mouth/Throat: No oropharyngeal exudate or posterior oropharyngeal edema.  Eyes: Conjunctivae, EOM and lids are normal. Pupils are equal, round, and reactive to light.  Neck: No JVD present. Carotid bruit is not present. No edema present. No thyroid mass and no thyromegaly present.  Cardiovascular: S1 normal and S2 normal.  Exam reveals no gallop.   Murmur heard.  Systolic murmur is present with a grade of 2/6  Pulses:      Dorsalis pedis pulses are 2+ on the right side, and 2+ on the left side.  Respiratory: No respiratory distress. She has wheezes in the right middle field, the right lower field and the left lower field. She has no rhonchi. She has rales in the right lower field and the left lower field.  Likely transmitted upper airway wheeze  GI: Soft. Bowel sounds are  normal. She exhibits distension and fluid wave. There is no tenderness.  Musculoskeletal:       Right ankle: She exhibits swelling.       Left ankle: She exhibits swelling.  Lymphadenopathy:    She has no cervical adenopathy.  Neurological: She is alert and oriented to person, place, and time. No cranial nerve deficit.  Skin: Skin is warm. Nails show no clubbing.  Small skin ulcerations lower extremities.  Psychiatric: She has a normal mood and affect.    Data Reviewed: Basic Metabolic Panel:  Recent Labs Lab 10/17/15 0230  10/19/15 0419 10/20/15 0450 10/21/15 0540 10/22/15 0520 10/23/15 0600  NA 138  < > 139 142 140 141 138  K 3.9  < > 3.5 3.7 3.6 3.7 3.5  CL 112*  < > 109 108 108 106 103  CO2 23  < > GLUCOSE 78  < > 83 161* 144* 157* 163*  BUN 52*  < > 53* 58* 59* 65* 70*  CREATININE 3.70*  < > 3.92* 4.12* 4.12* 4.12* 4.16*  CALCIUM 8.4*  < > 8.3* 8.2* 8.2* 8.2* 8.6*  MG 1.9  --   --   --   --   --   --   PHOS 5.2*  --   --   --   --   --   --   < > = values in this interval not displayed. Liver Function Tests:  Recent Labs Lab 10/17/15 0230 10/19/15 0419  AST  --  23  ALT  --  29  ALKPHOS  --  92  BILITOT  --  0.7  PROT  --  5.5*  ALBUMIN 1.8* 1.8*    Recent Labs Lab 10/19/15 0839  AMMONIA 28   CBC:  Recent Labs Lab 10/17/15 0230 10/18/15 0400 10/19/15 0419  WBC 3.9 8.8 10.5  NEUTROABS  --   --  8.9*  HGB 8.4* 8.4* 8.0*  HCT 26.2* 25.6* 24.5*  MCV 86.7 86.3 86.9  PLT 297 296 309    CBG:  Recent Labs Lab 10/22/15 0754 10/22/15 1201 10/22/15 1643 10/22/15 2053 10/23/15 0731  GLUCAP 146* 143* 115* 153* 211*    Recent Results (from the past 240 hour(s))  MRSA PCR Screening     Status: None   Collection Time: 10/17/15  9:02 AM  Result Value Ref Range Status   MRSA by PCR NEGATIVE NEGATIVE Final    Comment:        The GeneXpert MRSA Assay (FDA approved for NASAL specimens only), is one component of a comprehensive  MRSA colonization surveillance program. It is not intended to diagnose MRSA infection nor to guide or monitor treatment for MRSA infections.   C difficile quick scan w PCR reflex     Status: None   Collection Time: 10/18/15  5:50 AM  Result Value Ref Range Status   C Diff antigen NEGATIVE NEGATIVE Final   C Diff toxin NEGATIVE NEGATIVE Final   C Diff interpretation Negative for C. difficile  Final     Studies: US Renal  10/21/2015  CLINICAL DATA:  39 year old female with acute renal failure. EXAM: RENAL / URINARY TRACT ULTRASOUND COMPLETE COMPARISON:  10/10/2015 and prior ultrasounds FINDINGS: Right Kidney: Length: 11.0 cm. Increased renal echogenicity noted. There is no evidence of hydronephrosis or solid renal mass. Left Kidney: Length: 11.9 cm. Increased renal echogenicity noted. There is no evidence of hydronephrosis or solid renal mass. An 8 mm echogenic focus within the lower left kidney could represent a nonobstructing calculus. Bladder: A Foley catheter is noted within a collapsed bladder. IMPRESSION: Increased renal echogenicity compatible with medical renal disease. No evidence of hydronephrosis. Possible 8 mm nonobstructing left lower pole renal calculus. Foley catheter within the bladder. Electronically Signed   By: Harmon Pier M.D.   On: 10/21/2015 14:54    Scheduled Meds: . antiseptic oral rinse  7 mL Mouth Rinse BID  . carvedilol  25 mg Oral BID WC  . collagenase   Topical Daily  . enoxaparin (LOVENOX) injection  30 mg Subcutaneous Q24H  . hydrALAZINE  50 mg Oral 3 times per day  . insulin aspart  0-15 Units Subcutaneous TID WC  . insulin aspart  0-5 Units Subcutaneous QHS  . isosorbide mononitrate  60 mg Oral Daily  . sodium chloride  10-40 mL Intracatheter Q12H    Assessment/Plan:  1. Acute respiratory failure with hypoxia. Patient desaturated to 85% on room air. Continue oxygen supplementation. 2. Acute on chronic combined systolic and diastolic congestive heart  failure with anasarca. Nephrology would like to give the Lasix holiday at this point to see if creatinine improves. Continue Coreg. No ACE inhibitor at this point with creatinine being this high. 3. Acute renal failure on chronic kidney disease stage III. Proteinuria. Likely nephrotic syndrome. Nephrology following. Check a BMP daily. Creatinine  today 4.12. Nephrology once to give her Lasix holiday to see if creatinine improves. 4. PEA cardiac arrest. Cardiology wants to do ischemic workup as  outpatient 5. Essential hypertension- continue current medications 6. Hypoglycemia with history of type 2 diabetes- on sliding scale at this point 7. Still with hoarse voice- spoke with ENT to see her this afternoon. Likely from edema with intubation  Code Status:     Code Status Orders        Start     Ordered   10/10/15 1940  Full code   Continuous     10/10/15 1939     Disposition Plan: Potentially rehabilitation versus home with home health depending on progression. We'll need to seek kidney function improved prior to disposition.  Consultants: Cardiology Nephrology Pulmonology ENT  Time spent: 20 minutes  Alford HighlandWIETING, Akasia Ahmad  [facility]

## 2015-10-24 ENCOUNTER — Inpatient Hospital Stay (HOSPITAL_COMMUNITY)
Admit: 2015-10-24 | Discharge: 2015-10-24 | Disposition: A | Discharge status: Home / Self Care | Payer: Medicaid Other | Attending: Physician Assistant | Admitting: Physician Assistant

## 2015-10-24 DIAGNOSIS — I5031 Acute diastolic (congestive) heart failure: Secondary | ICD-10-CM

## 2015-10-24 LAB — BASIC METABOLIC PANEL
ANION GAP: 9 (ref 5–15)
BUN: 72 mg/dL — ABNORMAL HIGH (ref 6–20)
CHLORIDE: 102 mmol/L (ref 101–111)
CO2: 24 mmol/L (ref 22–32)
Calcium: 8.4 mg/dL — ABNORMAL LOW (ref 8.9–10.3)
Creatinine, Ser: 4.64 mg/dL — ABNORMAL HIGH (ref 0.44–1.00)
GFR, EST AFRICAN AMERICAN: 13 mL/min — AB (ref >60)
GFR, EST NON AFRICAN AMERICAN: 11 mL/min — AB (ref >60)
Glucose, Bld: 207 mg/dL — ABNORMAL HIGH (ref 65–99)
POTASSIUM: 3.7 mmol/L (ref 3.5–5.1)
SODIUM: 135 mmol/L (ref 135–145)

## 2015-10-24 LAB — GLUCOSE, CAPILLARY
GLUCOSE-CAPILLARY: 201 mg/dL — AB (ref 65–99)
GLUCOSE-CAPILLARY: 171 mg/dL — AB (ref 65–99)
GLUCOSE-CAPILLARY: 152 mg/dL — AB (ref 65–99)

## 2015-10-24 MED ORDER — FUROSEMIDE 40 MG PO TABS
80.0000 mg | ORAL_TABLET | Freq: Two times a day (BID) | ORAL | Status: DC
  Administered 2015-10-24 – 2015-11-02 (×14): 80 mg via ORAL
  Filled 2015-10-24 (×15): qty 2

## 2015-10-24 NOTE — Progress Notes (Addendum)
Patient: Karen Rich / Admit Date: 10/10/2015 / Date of Encounter: 10/24/2015, 10:30 AM   Subjective: Still with significant SOB, though she reports improved from prior. On 4L via nasal cannula. Lower extremity edema is slowly improving, but remains significant. Cough remains with some sputum.   Review of Systems: Review of Systems  Constitutional: Positive for weight loss and malaise/fatigue. Negative for fever, chills and diaphoresis.  HENT: Positive for sore throat. Negative for congestion.   Eyes: Negative for discharge and redness.  Respiratory: Positive for cough, sputum production and shortness of breath. Negative for hemoptysis and wheezing.   Cardiovascular: Positive for leg swelling. Negative for chest pain, palpitations, orthopnea, claudication and PND.  Gastrointestinal: Negative for nausea and vomiting.  Musculoskeletal: Negative for falls.  Skin: Negative for rash.  Neurological: Positive for weakness. Negative for sensory change, speech change and focal weakness.  Endo/Heme/Allergies: Does not bruise/bleed easily.  Psychiatric/Behavioral: The patient is not nervous/anxious.     Objective: Telemetry: NSR, 70's Physical Exam: Blood pressure 125/65, pulse 74, temperature 98.1 F (36.7 C), temperature source Oral, resp. rate 20, height 5' 3.5" (1.613 m), weight 197 lb 1.6 oz (89.404 kg), last menstrual period 09/08/2015, SpO2 97 %. Body mass index is 34.36 kg/(m^2). General: Well developed, well nourished, in no acute distress. Head: Normocephalic, atraumatic, sclera non-icteric, no xanthomas, nares are without discharge. Neck: Negative for carotid bruits. JVP 10+. Lungs: Resp regular and mildly labored, diffuse rails, decreased breath sounds at the bases bilaterally. Heart: RRR S1 S2 without murmurs, rubs, or gallops.  Abdomen: Soft, non-tender, non-distended with normoactive bowel sounds. No rebound/guarding. Extremities: No clubbing or cyanosis. 2+ edema. Distal  pedal pulses are 2+ and equal bilaterally. Neuro: Alert and oriented X 3. Moves all extremities spontaneously. Psych:  Responds to questions appropriately with a normal affect.   Intake/Output Summary (Last 24 hours) at 10/24/15 1030 Last data filed at 10/24/15 0800  Gross per 24 hour  Intake    370 ml  Output    200 ml  Net    170 ml    Inpatient Medications:  . antiseptic oral rinse  7 mL Mouth Rinse BID  . carvedilol  25 mg Oral BID WC  . collagenase   Topical Daily  . enoxaparin (LOVENOX) injection  40 mg Subcutaneous Q24H  . fluticasone  2 spray Each Nare Daily  . hydrALAZINE  50 mg Oral 3 times per day  . insulin aspart  0-15 Units Subcutaneous TID WC  . insulin aspart  0-5 Units Subcutaneous QHS  . isosorbide mononitrate  60 mg Oral Daily  . sodium chloride  10-40 mL Intracatheter Q12H   Infusions:    Labs:  Recent Labs  10/23/15 0600 10/24/15 0954  NA 138 135  K 3.5 3.7  CL 103 102  CO2 26 24  GLUCOSE 163* 207*  BUN 70* 72*  CREATININE 4.16* 4.64*  CALCIUM 8.6* 8.4*   No results for input(s): AST, ALT, ALKPHOS, BILITOT, PROT, ALBUMIN in the last 72 hours. No results for input(s): WBC, NEUTROABS, HGB, HCT, MCV, PLT in the last 72 hours. No results for input(s): CKTOTAL, CKMB, TROPONINI in the last 72 hours. Invalid input(s): POCBNP No results for input(s): HGBA1C in the last 72 hours.   Weights: Filed Weights   10/22/15 0459 10/23/15 0508 10/24/15 0442  Weight: 210 lb 8 oz (95.482 kg) 209 lb (94.802 kg) 197 lb 1.6 oz (89.404 kg)     Radiology/Studies:  Dg Chest 2 View  10/20/2015  CLINICAL DATA:  Pleural effusion, weakness, shortness of breath and cough. EXAM: CHEST  2 VIEW COMPARISON:  Chest x-ray dated 10/16/2015. FINDINGS: Endotracheal tube has been removed in the interval. Right-sided central line remains with tip well-positioned in the expected location of the cavoatrial junction. Mild cardiomegaly is unchanged. There is mild central pulmonary  vascular congestion but this appears improved compared to the previous study suggesting improved fluid status. There is significantly improved aeration throughout the right lung. Improved aeration also noted at the left lung base. Small residual opacities at each lung base are probably mild atelectasis. Also suspect small residual left pleural effusion. No new lung abnormality. IMPRESSION: Significant interval improvement as detailed above. Electronically Signed   By: Bary RichardStan  Maynard M.D.   On: 10/20/2015 12:29   Dg Chest 2 View  10/10/2015  CLINICAL DATA:  Short of breath and edema EXAM: CHEST  2 VIEW COMPARISON:  07/10/2013 FINDINGS: Lungs are severely under aerated. Small pleural effusions are noted on the lateral view. There is increased opacity towards the lung bases favored represent volume loss. Developing airspace disease is not excluded. Pulmonary vasculature is within normal limits. IMPRESSION: Low lung volumes. Small bilateral pleural effusions Bibasilar volume loss. Electronically Signed   By: Jolaine ClickArthur  Hoss M.D.   On: 10/10/2015 15:46   Ct Head Wo Contrast  10/18/2015  CLINICAL DATA:  Altered mental status.  Lethargy. EXAM: CT HEAD WITHOUT CONTRAST TECHNIQUE: Contiguous axial images were obtained from the base of the skull through the vertex without intravenous contrast. COMPARISON:  None. FINDINGS: Gray-white differentiation is maintained. No CT evidence of acute large territory infarct. No intraparenchymal or extra-axial mass or hemorrhage. Normal size a configuration of the ventricles and basilar cisterns. No midline shift. Limited visualization the paranasal emesis and mastoid air cells is normal. No air-fluid levels. Regional soft tissues appear normal. No displaced calvarial fracture. IMPRESSION: Negative noncontrast head CT. Electronically Signed   By: Simonne ComeJohn  Watts M.D.   On: 10/18/2015 21:17   [place] Renal  10/21/2015  CLINICAL DATA:  39 year old female with acute renal failure. EXAM: RENAL /  URINARY TRACT ULTRASOUND COMPLETE COMPARISON:  10/10/2015 and prior ultrasounds FINDINGS: Right Kidney: Length: 11.0 cm. Increased renal echogenicity noted. There is no evidence of hydronephrosis or solid renal mass. Left Kidney: Length: 11.9 cm. Increased renal echogenicity noted. There is no evidence of hydronephrosis or solid renal mass. An 8 mm echogenic focus within the lower left kidney could represent a nonobstructing calculus. Bladder: A Foley catheter is noted within a collapsed bladder. IMPRESSION: Increased renal echogenicity compatible with medical renal disease. No evidence of hydronephrosis. Possible 8 mm nonobstructing left lower pole renal calculus. Foley catheter within the bladder. Electronically Signed   By: Harmon PierJeffrey  Hu M.D.   On: 10/21/2015 14:54   [place] Renal  10/10/2015  CLINICAL DATA:  Acute renal failure. EXAM: RENAL / URINARY TRACT ULTRASOUND COMPLETE COMPARISON:  07/10/2013 chest CT. FINDINGS: Right Kidney: Length: 9.3 cm. Echogenic right kidney with no right hydronephrosis and no right renal mass. No right renal cortical atrophy. Left Kidney: Length: 10.7 cm. Echogenic left kidney with no left hydronephrosis and no left renal mass. No left renal cortical atrophy. Incidentally noted is a small left pleural effusion. Bladder: Appears normal for degree of bladder distention. IMPRESSION: 1. No hydronephrosis. 2. Echogenic normal size kidneys, indicating nonspecific renal parenchymal disease of uncertain chronicity. 3. Incidental small left pleural effusion. 4. Normal bladder. Electronically Signed   By: Delbert PhenixJason A Poff M.D.   On:  10/10/2015 18:59   Dg Chest Port 1 View  10/16/2015  CLINICAL DATA:  Respiratory failure.  Intubated. EXAM: PORTABLE CHEST 1 VIEW COMPARISON:  10/15/2015 FINDINGS: Endotracheal tube remains near the carina with the tip approximately 14 mm above the carina. There is cardiomegaly. Moderate layering right pleural effusion. Bilateral vascular congestion and airspace  opacities could reflect edema. Mild cardiomegaly. Low lung volumes. IMPRESSION: Endotracheal tube just above the carina approximately 14 mm. Layering right effusion. Bilateral airspace opacities likely reflects edema. Low volumes with cardiomegaly. Electronically Signed   By: Charlett Nose M.D.   On: 10/16/2015 11:55   Dg Chest Port 1 View  10/15/2015  CLINICAL DATA:  Status post intubation. EXAM: PORTABLE CHEST 1 VIEW COMPARISON:  Earlier film, same date. FINDINGS: External pacer paddles are noted. The right IJ center venous catheter tip is in the right atrium and could be retracted 2 cm. The endotracheal tube is right at the carina. Very low lung volumes with vascular crowding atelectasis. Stable cardiac enlargement and pulmonary edema. IMPRESSION: 1. The endotracheal tube is right at the carina and should be retracted approximately 2-3 cm. 2. Right IJ central venous catheter tip is in the right atrium. This could be retracted 2-3 cm. 3. Low lung volumes with vascular crowding, atelectasis and persistent mild interstitial edema. Electronically Signed   By: Rudie Meyer M.D.   On: 10/15/2015 19:26   Dg Chest Port 1 View  10/15/2015  CLINICAL DATA:  Central line placement.  Readjustment. EXAM: PORTABLE CHEST 1 VIEW COMPARISON:  10/15/2015 FINDINGS: Right central line tip is near the cavoatrial junction. Decreasing lung volumes with low lung volumes increasing bilateral lower lobe opacities. Cardiomegaly. No pneumothorax. Mild vascular congestion. IMPRESSION: Retraction of the right central line with the tip now near the cavoatrial junction. Cardiomegaly, vascular congestion. Decreasing lung volumes with bibasilar atelectasis or infiltrates. Electronically Signed   By: Charlett Nose M.D.   On: 10/15/2015 14:46   Dg Chest Port 1 View  10/15/2015  CLINICAL DATA:  Central line placement EXAM: PORTABLE CHEST 1 VIEW COMPARISON:  10/10/2015 FINDINGS: Enlarged cardiac silhouette. Hazy attenuation in the perihilar  region bilaterally suggesting the possibility of pulmonary edema. Right internal jugular central line identified with tip extending about 3 cm into the right atrium. No pneumothorax. IMPRESSION: No pneumothorax.  Central line extends into right atrium. Electronically Signed   By: Esperanza Heir M.D.   On: 10/15/2015 13:51     Assessment and Plan  1. Acute on chronic combined systolic/diastolic chf:  Minus only 330 ml on 11/14 and 200 for 11/15. Net neg 20 L since admission. Wt listed @ 209.  Urine output has dropped off.  Lasix currently on hold. Would continue aggressive IV Lasix, if able otherwise could need dialysis.  Unable to obtain CVP.  Will plan for repeat echo to obtain right heart pressures (may need RHC for complete evaluation).   2. PEA arrest:  Thought primarily to be a respiratory event. Plan for outpt w/u to r/o ischemia.   3. Essential HTN:  BP trending in 150's for the most part. Hydral/nitrate adjusted yesterday. - better BPs today. 4. CKD III:  Creat up slightly this AM. BUN up to 70. Bicarb stable. Renal following.  Lasix on hold per Renal - likely pre-renal.  As above.  ? If there may be a component or Congestive Nephropathy (CardioRenal) involvement.   SignedEula Listen, PA-C Pager: [phone] 10/24/2015, 10:30 AM   I have seen, examined and evaluated the  patient this AM along with Mr. Shea Evans, Cordelia Poche on rounds.  After reviewing all the available data and chart,  I agree with his findings, examination as well as impression recommendations.  Admitted with poorly controlled BP leading to A on C DHF - diuresis is limited by renal insufficiency. Defer +/- restarting diuretic to Renal.  Repeat Echo on order - may need to consider RHC to guide Rx.  - no plan for LHC at present - would prefer to avoid LHC to avoid contrast.  On full dose Coreg along with Hydralazine/Imdur for afterload reduction.   Will follow.   Marykay Lex, M.D.,  M.S. Interventional Cardiologist   Pager # [phone]

## 2015-10-24 NOTE — Progress Notes (Signed)
Subjective:  Cr remains high despite holding diuretics. In fact Cr slightly higher today. Denies worsening shortness fo breath however.  Objective:  Vital signs in last 24 hours:  Temp:  [97.9 F (36.6 C)-98.4 F (36.9 C)] 98 F (36.7 C) (11/16 1115) Pulse Rate:  [74-82] 74 (11/16 1115) Resp:  [18-20] 18 (11/16 1115) BP: (125-154)/(65-72) 136/72 mmHg (11/16 1115) SpO2:  [94 %-100 %] 100 % (11/16 1115) Weight:  [89.404 kg (197 lb 1.6 oz)] 89.404 kg (197 lb 1.6 oz) (11/16 0442)  Weight change: -5.398 kg (-11 lb 14.4 oz) Filed Weights   10/22/15 0459 10/23/15 0508 10/24/15 0442  Weight: 95.482 kg (210 lb 8 oz) 94.802 kg (209 lb) 89.404 kg (197 lb 1.6 oz)    Intake/Output:    Intake/Output Summary (Last 24 hours) at 10/24/15 1628 Last data filed at 10/24/15 1200  Gross per 24 hour  Intake    600 ml  Output    200 ml  Net    400 ml     Physical Exam: General: No acute distress  HEENT [place]/AT EOMI hearing intact  Neck Supple trachea midline  Pulm/lungs Mild bibasilar rales normal effort  CVS/Heart S1S2 no rubs  Abdomen:  +distended, +abdominal wall edema  Extremities: Tight 1+ b/l LE edema  Neurologic: Alert, oriented x 3, follows comamnds  Skin: Excoriations on b/l LE's noted          Basic Metabolic Panel:   Recent Labs Lab 10/20/15 0450 10/21/15 0540 10/22/15 0520 10/23/15 0600 10/24/15 0954  NA 142 140 141 138 135  K 3.7 3.6 3.7 3.5 3.7  CL 108 108 106 103 102  CO2 27 26 29 26 24   GLUCOSE 161* 144* 157* 163* 207*  BUN 58* 59* 65* 70* 72*  CREATININE 4.12* 4.12* 4.12* 4.16* 4.64*  CALCIUM 8.2* 8.2* 8.2* 8.6* 8.4*     CBC:  Recent Labs Lab 10/18/15 0400 10/19/15 0419  WBC 8.8 10.5  NEUTROABS  --  8.9*  HGB 8.4* 8.0*  HCT 25.6* 24.5*  MCV 86.3 86.9  PLT 296 309      Microbiology:  Recent Results (from the past 720 hour(s))  C difficile quick scan w PCR reflex     Status: None   Collection Time: 10/10/15 10:21 PM  Result Value Ref  Range Status   C Diff antigen NEGATIVE NEGATIVE Final   C Diff toxin NEGATIVE NEGATIVE Final   C Diff interpretation Negative for C. difficile  Final  C difficile quick scan w PCR reflex     Status: None   Collection Time: 10/12/15 10:35 AM  Result Value Ref Range Status   C Diff antigen NEGATIVE NEGATIVE Final   C Diff toxin NEGATIVE NEGATIVE Final   C Diff interpretation Negative for C. difficile  Final  MRSA PCR Screening     Status: None   Collection Time: 10/17/15  9:02 AM  Result Value Ref Range Status   MRSA by PCR NEGATIVE NEGATIVE Final    Comment:        The GeneXpert MRSA Assay (FDA approved for NASAL specimens only), is one component of a comprehensive MRSA colonization surveillance program. It is not intended to diagnose MRSA infection nor to guide or monitor treatment for MRSA infections.   C difficile quick scan w PCR reflex     Status: None   Collection Time: 10/18/15  5:50 AM  Result Value Ref Range Status   C Diff antigen NEGATIVE NEGATIVE Final   C  Diff toxin NEGATIVE NEGATIVE Final   C Diff interpretation Negative for C. difficile  Final    Coagulation Studies: No results for input(s): LABPROT, INR in the last 72 hours.  Urinalysis: No results for input(s): COLORURINE, LABSPEC, PHURINE, GLUCOSEU, HGBUR, BILIRUBINUR, KETONESUR, PROTEINUR, UROBILINOGEN, NITRITE, LEUKOCYTESUR in the last 72 hours.  Invalid input(s): APPERANCEUR    Imaging: No results found.   Medications:     . antiseptic oral rinse  7 mL Mouth Rinse BID  . carvedilol  25 mg Oral BID WC  . collagenase   Topical Daily  . enoxaparin (LOVENOX) injection  40 mg Subcutaneous Q24H  . fluticasone  2 spray Each Nare Daily  . hydrALAZINE  50 mg Oral 3 times per day  . insulin aspart  0-15 Units Subcutaneous TID WC  . insulin aspart  0-5 Units Subcutaneous QHS  . isosorbide mononitrate  60 mg Oral Daily  . sodium chloride  10-40 mL Intracatheter Q12H   sodium chloride, acetaminophen  **OR** acetaminophen, hydrALAZINE, loperamide, ondansetron **OR** ondansetron (ZOFRAN) IV, sodium chloride, sodium chloride  Assessment/ Plan:  39 y.o. 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  1. 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 Acute renal failure from acute cardiorenal syndrome from systolic acute congestive heart failure  Echocardiogram from 11/3 with EF of 40-45%. Also with hypotension during cardiac arrest.  Chronic kidney disease with proteinuria (nephrotic range: 4.9 grams) secondary to diabetic nephropathy.  Baseline creatinine of 1.2 in 08/2014. Unclear how much progression of disease. - Will restart lasix at 30m po BID as renal function worsening off of diuretic therapy.  EGFR remains quite low at 13.  May end up needing dialysis.   2. Acute exacerbation of systolic congestive heart failure with anasarca: currently with significant volume overload -  Resume lasix 839mpo bid.  3. 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  - continue current insulin regimen.  4. Hypertension: blood pressure well controlled at 136/72, continue pt on hydralazine, coreg.   LOS: 14 Karen Rich 11/16/20164:28 PM

## 2015-10-24 NOTE — Progress Notes (Signed)
*  PRELIMINARY RESULTS* Echocardiogram 2D Echocardiogram has been performed.  Georgann HousekeeperJerry R Hege 10/24/2015, 1:57 PM

## 2015-10-24 NOTE — Progress Notes (Signed)
Patient ID: Karen PellegriniCamille Y Mountz, female   DOB: 13-Oct-1976, 39 y.o.   MRN: [id number]  [facility] PROGRESS NOTE  PCP: Sherrie MustacheFayegh Jadali, MD  HPI/Subjective: Patient feeling a little bit better today. Still with hoarse voice and she does not want any steroids at this point. Still very weak. Still with a lot of swelling.  Objective: Filed Vitals:   10/24/15 1115  BP: 136/72  Pulse: 74  Temp: 98 F (36.7 C)  Resp: 18    Filed Weights   10/22/15 0459 10/23/15 0508 10/24/15 0442  Weight: 95.482 kg (210 lb 8 oz) 94.802 kg (209 lb) 89.404 kg (197 lb 1.6 oz)    ROS: Review of Systems  Constitutional: Positive for malaise/fatigue. Negative for fever and chills.  Eyes: Negative for blurred vision.  Respiratory: Positive for cough and shortness of breath.   Cardiovascular: Negative for chest pain.  Gastrointestinal: Negative for nausea, vomiting, abdominal pain, diarrhea and constipation.  Genitourinary: Negative for dysuria.  Musculoskeletal: Negative for joint pain.  Neurological: Negative for dizziness and headaches.   Exam: Physical Exam  Constitutional: She is oriented to person, place, and time.  HENT:  Nose: No mucosal edema.  Mouth/Throat: No oropharyngeal exudate or posterior oropharyngeal edema.  Eyes: Conjunctivae, EOM and lids are normal. Pupils are equal, round, and reactive to light.  Neck: No JVD present. Carotid bruit is not present. No edema present. No thyroid mass and no thyromegaly present.  Cardiovascular: S1 normal and S2 normal.  Exam reveals no gallop.   Murmur heard.  Systolic murmur is present with a grade of 2/6  Pulses:      Dorsalis pedis pulses are 2+ on the right side, and 2+ on the left side.  Respiratory: No respiratory distress. She has wheezes in the right lower field. She has no rhonchi. She has rales in the right middle field, the right lower field, the left middle field and the left lower field.  Likely transmitted upper airway wheeze   GI: Soft. Bowel sounds are normal. She exhibits distension and fluid wave. There is no tenderness.  Musculoskeletal:       Right ankle: She exhibits swelling.       Left ankle: She exhibits swelling.  Lymphadenopathy:    She has no cervical adenopathy.  Neurological: She is alert and oriented to person, place, and time. No cranial nerve deficit.  Skin: Skin is warm. Nails show no clubbing.  Small skin ulcerations lower extremities.  Psychiatric: She has a normal mood and affect.    Data Reviewed: Basic Metabolic Panel:  Recent Labs Lab 10/20/15 0450 10/21/15 0540 10/22/15 0520 10/23/15 0600 10/24/15 0954  NA 142 140 141 138 135  K 3.7 3.6 3.7 3.5 3.7  CL 108 108 106 103 102  CO2 27 26 29 26 24   GLUCOSE 161* 144* 157* 163* 207*  BUN 58* 59* 65* 70* 72*  CREATININE 4.12* 4.12* 4.12* 4.16* 4.64*  CALCIUM 8.2* 8.2* 8.2* 8.6* 8.4*   Liver Function Tests:  Recent Labs Lab 10/19/15 0419  AST 23  ALT 29  ALKPHOS 92  BILITOT 0.7  PROT 5.5*  ALBUMIN 1.8*    Recent Labs Lab 10/19/15 0839  AMMONIA 28   CBC:  Recent Labs Lab 10/18/15 0400 10/19/15 0419  WBC 8.8 10.5  NEUTROABS  --  8.9*  HGB 8.4* 8.0*  HCT 25.6* 24.5*  MCV 86.3 86.9  PLT 296 309    CBG:  Recent Labs Lab 10/23/15 1929 10/23/15 2105 10/24/15  1610 10/24/15 1203 10/24/15 1541  GLUCAP 162* 148* 201* 171* 152*    Recent Results (from the past 240 hour(s))  MRSA PCR Screening     Status: None   Collection Time: 10/17/15  9:02 AM  Result Value Ref Range Status   MRSA by PCR NEGATIVE NEGATIVE Final    Comment:        The GeneXpert MRSA Assay (FDA approved for NASAL specimens only), is one component of a comprehensive MRSA colonization surveillance program. It is not intended to diagnose MRSA infection nor to guide or monitor treatment for MRSA infections.   C difficile quick scan w PCR reflex     Status: None   Collection Time: 10/18/15  5:50 AM  Result Value Ref Range Status    C Diff antigen NEGATIVE NEGATIVE Final   C Diff toxin NEGATIVE NEGATIVE Final   C Diff interpretation Negative for C. difficile  Final    Scheduled Meds: . antiseptic oral rinse  7 mL Mouth Rinse BID  . carvedilol  25 mg Oral BID WC  . collagenase   Topical Daily  . enoxaparin (LOVENOX) injection  40 mg Subcutaneous Q24H  . fluticasone  2 spray Each Nare Daily  . hydrALAZINE  50 mg Oral 3 times per day  . insulin aspart  0-15 Units Subcutaneous TID WC  . insulin aspart  0-5 Units Subcutaneous QHS  . isosorbide mononitrate  60 mg Oral Daily  . sodium chloride  10-40 mL Intracatheter Q12H    Assessment/Plan:  1. Acute respiratory failure with hypoxia. Patient desaturated to 85% on room air. Continue oxygen supplementation. 2. Acute on chronic combined systolic and diastolic congestive heart failure with anasarca. Nephrology would like to give the Lasix holiday at this point to see if creatinine improves. Continue Coreg. No ACE inhibitor at this point with creatinine being this high. Echocardiogram repeated today and results still pending. 3. Acute renal failure on chronic kidney disease stage III. Proteinuria. Likely nephrotic syndrome. Nephrology following. Check a BMP daily. Creatinine worsened  today and is up to 4.64 and GFR is 13.  GFR is close to dialysis range here and we've been holding Lasix for 3 days. Continue to monitor creatinine closely. 4. PEA cardiac arrest. Cardiology wants to do ischemic workup as outpatient 5. Essential hypertension- continue current medications 6. Hypoglycemia with history of type 2 diabetes- on sliding scale at this point 7. Still with hoarse voice, likely will improve over time. Appreciate ENT consultation. Patient does not 1 steroids at this time.  Code Status:     Code Status Orders        Start     Ordered   10/10/15 1940  Full code   Continuous     10/10/15 1939     Disposition Plan: Potentially rehabilitation versus home with home  health depending on progression. We'll need to seek kidney function improved prior to disposition.  Consultants: Cardiology Nephrology Pulmonology ENT  Time spent: 20 minutes. Spoke with the patient's mother at the bedside today.  Alford Highland  [facility]

## 2015-10-25 LAB — GLUCOSE, CAPILLARY
GLUCOSE-CAPILLARY: 134 mg/dL — AB (ref 65–99)
GLUCOSE-CAPILLARY: 153 mg/dL — AB (ref 65–99)
Glucose-Capillary: 150 mg/dL — ABNORMAL HIGH (ref 65–99)
Glucose-Capillary: 167 mg/dL — ABNORMAL HIGH (ref 65–99)

## 2015-10-25 LAB — BASIC METABOLIC PANEL
ANION GAP: 10 (ref 5–15)
BUN: 70 mg/dL — ABNORMAL HIGH (ref 6–20)
CALCIUM: 8.7 mg/dL — AB (ref 8.9–10.3)
CHLORIDE: 102 mmol/L (ref 101–111)
CO2: 24 mmol/L (ref 22–32)
Creatinine, Ser: 4.43 mg/dL — ABNORMAL HIGH (ref 0.44–1.00)
GFR calc non Af Amer: 11 mL/min — ABNORMAL LOW (ref >60)
GFR, EST AFRICAN AMERICAN: 13 mL/min — AB (ref >60)
Glucose, Bld: 149 mg/dL — ABNORMAL HIGH (ref 65–99)
Potassium: 3.9 mmol/L (ref 3.5–5.1)
Sodium: 136 mmol/L (ref 135–145)

## 2015-10-25 LAB — HEMOGLOBIN: HEMOGLOBIN: 7.2 g/dL — AB (ref 12.0–16.0)

## 2015-10-25 LAB — PLATELET COUNT: Platelets: 346 10*3/uL (ref 150–440)

## 2015-10-25 MED ORDER — HEPARIN SODIUM (PORCINE) 5000 UNIT/ML IJ SOLN
5000.0000 [IU] | Freq: Three times a day (TID) | INTRAMUSCULAR | Status: DC
  Administered 2015-10-25 – 2015-11-02 (×20): 5000 [IU] via SUBCUTANEOUS
  Filled 2015-10-25 (×21): qty 1

## 2015-10-25 MED ORDER — ENOXAPARIN SODIUM 30 MG/0.3ML ~~LOC~~ SOLN: 30.0000 mg | SUBCUTANEOUS | Status: DC

## 2015-10-25 NOTE — Care Management (Signed)
Patient strongly desires to return home rather than go to skilled nursing facility.  Spoke with Will from [facility] to determine if patient's medicaid would pay for home 02 and DME.  Informed it appears it will be covered.  Spoke with patient about the need for physical therapy  and that medicaid will not pay for it.  Discussed if she returns home she would have to have 24 hour round the clock supervision.  She says her mother can help.  Discussed speaking with other family members to see if can come up with a scheduled.  Will investigate whether [facility] can assist with payment for physical therapy visits.  Patient has been started back on oral lasix today.

## 2015-10-25 NOTE — Progress Notes (Signed)
Patient: Karen Rich / Admit Date: 10/10/2015 / Date of Encounter: 10/25/2015, 9:57 AM   Subjective: Feeling better this morning, but still weak. Up sitting in chair this morning. PO Lasix restarted by Renal on 11/16. Renal function improved from 4.64-->4.43. No documented ins and outs.    Review of Systems: Review of Systems  Constitutional: Positive for weight loss and malaise/fatigue. Negative for fever, chills and diaphoresis.  HENT: Positive for sore throat. Negative for congestion.   Eyes: Negative for discharge and redness.  Respiratory: Positive for cough, sputum production and shortness of breath. Negative for hemoptysis and wheezing.   Cardiovascular: Positive for leg swelling. Negative for chest pain, palpitations, orthopnea, claudication and PND.  Gastrointestinal: Negative for nausea and vomiting.  Musculoskeletal: Negative for falls.  Neurological: Positive for weakness. Negative for dizziness, sensory change, speech change, focal weakness and loss of consciousness.  Endo/Heme/Allergies: Does not bruise/bleed easily.  Psychiatric/Behavioral: The patient is not nervous/anxious.      Objective: Telemetry: NSR, 70's Physical Exam: Blood pressure 158/73, pulse 81, temperature 98.2 F (36.8 C), temperature source Oral, resp. rate 20, height 5' 3.5" (1.613 m), weight 211 lb (95.709 kg), last menstrual period 09/08/2015, SpO2 92 %. Body mass index is 36.79 kg/(m^2). General: Well developed, well nourished, in no acute distress. Head: Normocephalic, atraumatic, sclera non-icteric, no xanthomas, nares are without discharge. Neck: Negative for carotid bruits. JVP 10+. Lungs: Resp regular and mildly labored, diffuse rails, decreased breath sounds at the bases bilaterally. Heart: RRR S1 S2 without murmurs, rubs, or gallops.  Abdomen: Soft, non-tender, non-distended with normoactive bowel sounds. No rebound/guarding. Extremities: No clubbing or cyanosis. 2+ edema. Distal pedal  pulses are 2+ and equal bilaterally. Neuro: Alert and oriented X 3. Moves all extremities spontaneously. Psych:  Responds to questions appropriately with a normal affect.   Intake/Output Summary (Last 24 hours) at 10/25/15 0957 Last data filed at 10/25/15 0700  Gross per 24 hour  Intake    408 ml  Output      0 ml  Net    408 ml    Inpatient Medications:  . antiseptic oral rinse  7 mL Mouth Rinse BID  . carvedilol  25 mg Oral BID WC  . collagenase   Topical Daily  . enoxaparin (LOVENOX) injection  30 mg Subcutaneous Q24H  . fluticasone  2 spray Each Nare Daily  . furosemide  80 mg Oral BID  . hydrALAZINE  50 mg Oral 3 times per day  . insulin aspart  0-15 Units Subcutaneous TID WC  . insulin aspart  0-5 Units Subcutaneous QHS  . isosorbide mononitrate  60 mg Oral Daily  . sodium chloride  10-40 mL Intracatheter Q12H   Infusions:    Labs:  Recent Labs  10/24/15 0954 10/25/15 0414  NA 135 136  K 3.7 3.9  CL 102 102  CO2 24 24  GLUCOSE 207* 149*  BUN 72* 70*  CREATININE 4.64* 4.43*  CALCIUM 8.4* 8.7*   No results for input(s): AST, ALT, ALKPHOS, BILITOT, PROT, ALBUMIN in the last 72 hours.  Recent Labs  10/25/15 0414  HGB 7.2*   No results for input(s): CKTOTAL, CKMB, TROPONINI in the last 72 hours. Invalid input(s): POCBNP No results for input(s): HGBA1C in the last 72 hours.   Weights: Filed Weights   10/23/15 0508 10/24/15 0442 10/25/15 0626  Weight: 209 lb (94.802 kg) 197 lb 1.6 oz (89.404 kg) 211 lb (95.709 kg)     Radiology/Studies:  Dg  Chest 2 View  10/20/2015  CLINICAL DATA:  Pleural effusion, weakness, shortness of breath and cough. EXAM: CHEST  2 VIEW COMPARISON:  Chest x-ray dated 10/16/2015. FINDINGS: Endotracheal tube has been removed in the interval. Right-sided central line remains with tip well-positioned in the expected location of the cavoatrial junction. Mild cardiomegaly is unchanged. There is mild central pulmonary vascular congestion  but this appears improved compared to the previous study suggesting improved fluid status. There is significantly improved aeration throughout the right lung. Improved aeration also noted at the left lung base. Small residual opacities at each lung base are probably mild atelectasis. Also suspect small residual left pleural effusion. No new lung abnormality. IMPRESSION: Significant interval improvement as detailed above. Electronically Signed   By: Bary RichardStan  Maynard M.D.   On: 10/20/2015 12:29   Dg Chest 2 View  10/10/2015  CLINICAL DATA:  Short of breath and edema EXAM: CHEST  2 VIEW COMPARISON:  07/10/2013 FINDINGS: Lungs are severely under aerated. Small pleural effusions are noted on the lateral view. There is increased opacity towards the lung bases favored represent volume loss. Developing airspace disease is not excluded. Pulmonary vasculature is within normal limits. IMPRESSION: Low lung volumes. Small bilateral pleural effusions Bibasilar volume loss. Electronically Signed   By: Jolaine ClickArthur  Hoss M.D.   On: 10/10/2015 15:46   Ct Head Wo Contrast  10/18/2015  CLINICAL DATA:  Altered mental status.  Lethargy. EXAM: CT HEAD WITHOUT CONTRAST TECHNIQUE: Contiguous axial images were obtained from the base of the skull through the vertex without intravenous contrast. COMPARISON:  None. FINDINGS: Gray-white differentiation is maintained. No CT evidence of acute large territory infarct. No intraparenchymal or extra-axial mass or hemorrhage. Normal size a configuration of the ventricles and basilar cisterns. No midline shift. Limited visualization the paranasal emesis and mastoid air cells is normal. No air-fluid levels. Regional soft tissues appear normal. No displaced calvarial fracture. IMPRESSION: Negative noncontrast head CT. Electronically Signed   By: Simonne ComeJohn  Watts M.D.   On: 10/18/2015 21:17   [place] Renal  10/21/2015  CLINICAL DATA:  39 year old female with acute renal failure. EXAM: RENAL / URINARY TRACT  ULTRASOUND COMPLETE COMPARISON:  10/10/2015 and prior ultrasounds FINDINGS: Right Kidney: Length: 11.0 cm. Increased renal echogenicity noted. There is no evidence of hydronephrosis or solid renal mass. Left Kidney: Length: 11.9 cm. Increased renal echogenicity noted. There is no evidence of hydronephrosis or solid renal mass. An 8 mm echogenic focus within the lower left kidney could represent a nonobstructing calculus. Bladder: A Foley catheter is noted within a collapsed bladder. IMPRESSION: Increased renal echogenicity compatible with medical renal disease. No evidence of hydronephrosis. Possible 8 mm nonobstructing left lower pole renal calculus. Foley catheter within the bladder. Electronically Signed   By: Harmon PierJeffrey  Hu M.D.   On: 10/21/2015 14:54   [place] Renal  10/10/2015  CLINICAL DATA:  Acute renal failure. EXAM: RENAL / URINARY TRACT ULTRASOUND COMPLETE COMPARISON:  07/10/2013 chest CT. FINDINGS: Right Kidney: Length: 9.3 cm. Echogenic right kidney with no right hydronephrosis and no right renal mass. No right renal cortical atrophy. Left Kidney: Length: 10.7 cm. Echogenic left kidney with no left hydronephrosis and no left renal mass. No left renal cortical atrophy. Incidentally noted is a small left pleural effusion. Bladder: Appears normal for degree of bladder distention. IMPRESSION: 1. No hydronephrosis. 2. Echogenic normal size kidneys, indicating nonspecific renal parenchymal disease of uncertain chronicity. 3. Incidental small left pleural effusion. 4. Normal bladder. Electronically Signed   By: Delbert PhenixJason A Poff  M.D.   On: 10/10/2015 18:59   Dg Chest Port 1 View  10/16/2015  CLINICAL DATA:  Respiratory failure.  Intubated. EXAM: PORTABLE CHEST 1 VIEW COMPARISON:  10/15/2015 FINDINGS: Endotracheal tube remains near the carina with the tip approximately 14 mm above the carina. There is cardiomegaly. Moderate layering right pleural effusion. Bilateral vascular congestion and airspace opacities could  reflect edema. Mild cardiomegaly. Low lung volumes. IMPRESSION: Endotracheal tube just above the carina approximately 14 mm. Layering right effusion. Bilateral airspace opacities likely reflects edema. Low volumes with cardiomegaly. Electronically Signed   By: Charlett Nose M.D.   On: 10/16/2015 11:55   Dg Chest Port 1 View  10/15/2015  CLINICAL DATA:  Status post intubation. EXAM: PORTABLE CHEST 1 VIEW COMPARISON:  Earlier film, same date. FINDINGS: External pacer paddles are noted. The right IJ center venous catheter tip is in the right atrium and could be retracted 2 cm. The endotracheal tube is right at the carina. Very low lung volumes with vascular crowding atelectasis. Stable cardiac enlargement and pulmonary edema. IMPRESSION: 1. The endotracheal tube is right at the carina and should be retracted approximately 2-3 cm. 2. Right IJ central venous catheter tip is in the right atrium. This could be retracted 2-3 cm. 3. Low lung volumes with vascular crowding, atelectasis and persistent mild interstitial edema. Electronically Signed   By: Rudie Meyer M.D.   On: 10/15/2015 19:26   Dg Chest Port 1 View  10/15/2015  CLINICAL DATA:  Central line placement.  Readjustment. EXAM: PORTABLE CHEST 1 VIEW COMPARISON:  10/15/2015 FINDINGS: Right central line tip is near the cavoatrial junction. Decreasing lung volumes with low lung volumes increasing bilateral lower lobe opacities. Cardiomegaly. No pneumothorax. Mild vascular congestion. IMPRESSION: Retraction of the right central line with the tip now near the cavoatrial junction. Cardiomegaly, vascular congestion. Decreasing lung volumes with bibasilar atelectasis or infiltrates. Electronically Signed   By: Charlett Nose M.D.   On: 10/15/2015 14:46   Dg Chest Port 1 View  10/15/2015  CLINICAL DATA:  Central line placement EXAM: PORTABLE CHEST 1 VIEW COMPARISON:  10/10/2015 FINDINGS: Enlarged cardiac silhouette. Hazy attenuation in the perihilar region bilaterally  suggesting the possibility of pulmonary edema. Right internal jugular central line identified with tip extending about 3 cm into the right atrium. No pneumothorax. IMPRESSION: No pneumothorax.  Central line extends into right atrium. Electronically Signed   By: Esperanza Heir M.D.   On: 10/15/2015 13:51     Assessment and Plan   1. Acute on chronic combined systolic/diastolic chf:  Minus only 330 ml on 11/14 and 200 for 11/15. None documented on 11/16. Net neg 19.5 L since admission. Urine output has dropped off. PO Lasix restarted on 11/16 by Renal. Unable to obtain CVP. Repeat echo on 11/16 showed normal EF, GR2DD, elevated left heart pressure, consider TEE if clinically indicated given unable to exclude LA thrombus (may need RHC for complete evaluation, no plan for LHC at this time, would prefer to avoid contrast given her renal function).   2. PEA arrest:  Thought primarily to be a respiratory event. Plan for outpt w/u to r/o ischemia.   3. Essential HTN:  BP trending in 150's for the most part. Continue Hydral/nitrate and beta blocker.  4. CKD III:  Creat improving. Renal following. Lasix restarted per Renal. As above.  ? If there may be a component or Congestive Nephropathy (CardioRenal) involvement.   SignedEula Listen, PA-C Pager: [phone] 10/25/2015, 9:57 AM   Attending  Note Patient seen and examined, agree with detailed note above,  Patient presentation and plan discussed on rounds.   Minimal significant urine output on Lasix IV Without Lasix, with positive fluid intake Cardiac grams suggesting elevated right heart pressures Consistent with clinical exam, pitting edema through the thighs up to the abdomen ----Suspect she is heading towards hemodialysis given severe underlying renal dysfunction/failure HD would also likely help her hypertension   Signed: Dossie Arbour  M.D., Ph.D. [facility]

## 2015-10-25 NOTE — Progress Notes (Signed)
Patient ID: Karen Rich, female   DOB: 1975/12/26, 39 y.o.   MRN: [id number]  [facility] PROGRESS NOTE  PCP: Sherrie Mustache, MD  HPI/Subjective: Sitting in a chair, feeling some better. Still with hoarse voice from vocal cord edema. Still very weak. Still with a lot of swelling. Lasix orally has been restarted yesterday  Objective: Filed Vitals:   10/25/15 0800  BP: 158/73  Pulse: 81  Temp: 98.2 F (36.8 C)  Resp:     Filed Weights   10/23/15 0508 10/24/15 0442 10/25/15 0626  Weight: 94.802 kg (209 lb) 89.404 kg (197 lb 1.6 oz) 95.709 kg (211 lb)    ROS: Review of Systems  Constitutional: Positive for malaise/fatigue. Negative for fever and chills.  HENT:       Hoarse voice  Eyes: Negative for blurred vision.  Respiratory: Positive for cough and shortness of breath.   Cardiovascular: Negative for chest pain.  Gastrointestinal: Negative for nausea, vomiting, abdominal pain, diarrhea and constipation.  Genitourinary: Negative for dysuria.  Musculoskeletal: Negative for joint pain.  Neurological: Negative for dizziness and headaches.   Exam: Physical Exam  Constitutional: She is oriented to person, place, and time.  HENT:  Nose: No mucosal edema.  Mouth/Throat: No oropharyngeal exudate or posterior oropharyngeal edema.  Eyes: Conjunctivae, EOM and lids are normal. Pupils are equal, round, and reactive to light.  Neck: No JVD present. Carotid bruit is not present. No edema present. No thyroid mass and no thyromegaly present.  Cardiovascular: S1 normal and S2 normal.  Exam reveals no gallop.   Murmur heard.  Systolic murmur is present with a grade of 2/6  Pulses:      Dorsalis pedis pulses are 2+ on the right side, and 2+ on the left side.  Respiratory: No respiratory distress. She has no wheezes. She has no rhonchi. She has rales in the right middle field, the right lower field, the left middle field and the left lower field.  Likely transmitted upper  airway wheeze  GI: Soft. Bowel sounds are normal. She exhibits distension. She exhibits no fluid wave. There is no tenderness.  Musculoskeletal:       Right ankle: She exhibits swelling.       Left ankle: She exhibits swelling.  Lymphadenopathy:    She has no cervical adenopathy.  Neurological: She is alert and oriented to person, place, and time. No cranial nerve deficit.  Skin: Skin is warm. Nails show no clubbing.  Small skin ulcerations lower extremities.  Psychiatric: She has a normal mood and affect.    Data Reviewed: Basic Metabolic Panel:  Recent Labs Lab 10/21/15 0540 10/22/15 0520 10/23/15 0600 10/24/15 0954 10/25/15 0414  NA 140 141 138 135 136  K 3.6 3.7 3.5 3.7 3.9  CL 108 106 103 102 102  CO2 GLUCOSE 144* 157* 163* 207* 149*  BUN 59* 65* 70* 72* 70*  CREATININE 4.12* 4.12* 4.16* 4.64* 4.43*  CALCIUM 8.2* 8.2* 8.6* 8.4* 8.7*   Liver Function Tests:  Recent Labs Lab 10/19/15 0419  AST 23  ALT 29  ALKPHOS 92  BILITOT 0.7  PROT 5.5*  ALBUMIN 1.8*    Recent Labs Lab 10/19/15 0839  AMMONIA 28   CBC:  Recent Labs Lab 10/19/15 0419 10/25/15 0414  WBC 10.5  --   NEUTROABS 8AMILA CALLIESB 8.0* 7.2*  HCT 24.5*  --   MCV 86.9  --   PLT 309  --  CBG:  Recent Labs Lab 10/23/15 2105 10/24/15 0739 10/24/15 1203 10/24/15 1541 10/25/15 0830  GLUCAP 148* 201* 171* 152* 134*    Recent Results (from the past 240 hour(s))  MRSA PCR Screening     Status: None   Collection Time: 10/17/15  9:02 AM  Result Value Ref Range Status   MRSA by PCR NEGATIVE NEGATIVE Final    Comment:        The GeneXpert MRSA Assay (FDA approved for NASAL specimens only), is one component of a comprehensive MRSA colonization surveillance program. It is not intended to diagnose MRSA infection nor to guide or monitor treatment for MRSA infections.   C difficile quick scan w PCR reflex     Status: None   Collection Time: 10/18/15  5:50 AM   Result Value Ref Range Status   C Diff antigen NEGATIVE NEGATIVE Final   C Diff toxin NEGATIVE NEGATIVE Final   C Diff interpretation Negative for C. difficile  Final    Scheduled Meds: . antiseptic oral rinse  7 mL Mouth Rinse BID  . carvedilol  25 mg Oral BID WC  . collagenase   Topical Daily  . enoxaparin (LOVENOX) injection  30 mg Subcutaneous Q24H  . fluticasone  2 spray Each Nare Daily  . furosemide  80 mg Oral BID  . hydrALAZINE  50 mg Oral 3 times per day  . insulin aspart  0-15 Units Subcutaneous TID WC  . insulin aspart  0-5 Units Subcutaneous QHS  . isosorbide mononitrate  60 mg Oral Daily  . sodium chloride  10-40 mL Intracatheter Q12H    Assessment/Plan:  1. Acute respiratory failure with hypoxia. Patient desaturated to 85% on room air. Continue oxygen supplementation. Currently on 2 L oxygen. Secondary to pulmonary congestion. 2. Acute on chronic combined systolic and diastolic congestive heart failure with anasarca. Lasix restarted at 80 mg twice a day dosing as renal function worsening without Lasix. Continue Coreg. No ACE inhibitor at this point with creatinine being this high. Echocardiogram repeated . Likely cardiorenal syndrome. 3. Acute renal failure on chronic kidney disease stage III. Proteinuria. Nephrotic range proteinuria. Likely progression of diabetic nephropathy. Monitor while on Lasix. Also cardiorenal syndrome from CHF and anasarca. Monitor to see if patient needs dialysis. Nephrology following. Check a BMP daily.  4. PEA cardiac arrest. Cardiology wants to do ischemic workup as outpatient. Stable at this time. 5. Essential hypertension- continue current medications 6. Hypoglycemia with history of type 2 diabetes- on sliding scale at this point 7. Still with hoarse voice, likely will improve over time. Appreciate ENT consultation. Patient does not need steroids at this time.  Code Status:     Code Status Orders        Start     Ordered   10/10/15  1940  Full code   Continuous     10/10/15 1939     Disposition Plan: Potentially rehabilitation versus home with home health depending on progression. We'll need to seek kidney function improved prior to disposition.  Consultants: Cardiology Nephrology Pulmonology ENT  Time spent: 35 minutes.   Enid BaasKALISETTI,Yeison Sippel  [facility]

## 2015-10-25 NOTE — Progress Notes (Signed)
Subjective:  Renal function about the same. Creatinine 4.4. Hemoglobin also low at 7.2. Patient now back on diuretics.  Objective:  Vital signs in last 24 hours:  Temp:  [97.3 F (36.3 C)-98.4 F (36.9 C)] 98.3 F (36.8 C) (11/17 1235) Pulse Rate:  [79-83] 79 (11/17 1235) Resp:  [14-20] 14 (11/17 1235) BP: (143-166)/(71-77) 166/77 mmHg (11/17 1235) SpO2:  [92 %-100 %] 100 % (11/17 1235) Weight:  [95.709 kg (211 lb)] 95.709 kg (211 lb) (11/17 0626)  Weight change: 6.305 kg (13 lb 14.4 oz) Filed Weights   10/23/15 0508 10/24/15 0442 10/25/15 0626  Weight: 94.802 kg (209 lb) 89.404 kg (197 lb 1.6 oz) 95.709 kg (211 lb)    Intake/Output:    Intake/Output Summary (Last 24 hours) at 10/25/15 1624 Last data filed at 10/25/15 0900  Gross per 24 hour  Intake    408 ml  Output      0 ml  Net    408 ml     Physical Exam: General: No acute distress  HEENT [place]/AT EOMI hearing intact  Neck Supple trachea midline  Pulm/lungs Mild bibasilar rales normal effort  CVS/Heart S1S2 no rubs  Abdomen:  +distended, +abdominal wall edema  Extremities: Tight 2+ b/l LE edema  Neurologic: Alert, oriented x 3, follows comamnds  Skin: Excoriations on b/l LE's noted          Basic Metabolic Panel:   Recent Labs Lab 10/21/15 0540 10/22/15 0520 10/23/15 0600 10/24/15 0954 10/25/15 0414  NA 140 141 138 135 136  K 3.6 3.7 3.5 3.7 3.9  CL 108 106 103 102 102  CO2 GLUCOSE 144* 157* 163* 207* 149*  BUN 59* 65* 70* 72* 70*  CREATININE 4.12* 4.12* 4.16* 4.64* 4.43*  CALCIUM 8.2* 8.2* 8.6* 8.4* 8.7*     CBC:  Recent Labs Lab 10/19/15 0419 10/25/15 0414  WBC 10.5  --   NEUTROABS 8.9*  --   HGB 8.0* 7.2*  HCT 24.5*  --   MCV 86.9  --   PLT 309  --       Microbiology:  Recent Results (from the past 720 hour(s))  C difficile quick scan w PCR reflex     Status: None   Collection Time: 10/10/15 10:21 PM  Result Value Ref Range Status   C Diff antigen  NEGATIVE NEGATIVE Final   C Diff toxin NEGATIVE NEGATIVE Final   C Diff interpretation Negative for C. difficile  Final  C difficile quick scan w PCR reflex     Status: None   Collection Time: 10/12/15 10:35 AM  Result Value Ref Range Status   C Diff antigen NEGATIVE NEGATIVE Final   C Diff toxin NEGATIVE NEGATIVE Final   C Diff interpretation Negative for C. difficile  Final  MRSA PCR Screening     Status: None   Collection Time: 10/17/15  9:02 AM  Result Value Ref Range Status   MRSA by PCR NEGATIVE NEGATIVE Final    Comment:        The GeneXpert MRSA Assay (FDA approved for NASAL specimens only), is one component of a comprehensive MRSA colonization surveillance program. It is not intended to diagnose MRSA infection nor to guide or monitor treatment for MRSA infections.   C difficile quick scan w PCR reflex     Status: None   Collection Time: 10/18/15  5:50 AM  Result Value Ref Range Status   C Diff antigen NEGATIVE NEGATIVE Final  C Diff toxin NEGATIVE NEGATIVE Final   C Diff interpretation Negative for C. difficile  Final    Coagulation Studies: No results for input(s): LABPROT, INR in the last 72 hours.  Urinalysis: No results for input(s): COLORURINE, LABSPEC, PHURINE, GLUCOSEU, HGBUR, BILIRUBINUR, KETONESUR, PROTEINUR, UROBILINOGEN, NITRITE, LEUKOCYTESUR in the last 72 hours.  Invalid input(s): APPERANCEUR    Imaging: No results found.   Medications:     . antiseptic oral rinse  7 mL Mouth Rinse BID  . carvedilol  25 mg Oral BID WC  . collagenase   Topical Daily  . fluticasone  2 spray Each Nare Daily  . furosemide  80 mg Oral BID  . heparin subcutaneous  5,000 Units Subcutaneous 3 times per day  . hydrALAZINE  50 mg Oral 3 times per day  . insulin aspart  0-15 Units Subcutaneous TID WC  . insulin aspart  0-5 Units Subcutaneous QHS  . isosorbide mononitrate  60 mg Oral Daily  . sodium chloride  10-40 mL Intracatheter Q12H   sodium chloride,  acetaminophen **OR** acetaminophen, hydrALAZINE, loperamide, ondansetron **OR** ondansetron (ZOFRAN) IV, sodium chloride, sodium chloride  Assessment/ Plan:  39 y.o. female with medical problems of Diabetes type II (since Age 39, poorly controlled) with severe complications including retinopathy and kidney disease, hypertension, obesity who [facility] for evaluation of acute renal failure and anasarca.  1. 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 Acute renal failure from acute cardiorenal syndrome from systolic acute congestive heart failure  Echocardiogram from 11/3 with EF of 40-45%. Also with hypotension during cardiac arrest.  Chronic kidney disease with proteinuria (nephrotic range: 4.9 grams) secondary to diabetic nephropathy.  Baseline creatinine of 1.2 in 08/2014. Unclear how much progression of disease. -it appears that the patient may have had some progression of kidney disease.  She still has considerable volume overload. We have just restarted her on diuretic therapy. As before we may need to consider at least temporary dialysis to treat the acute component.  2. Acute exacerbation of systolic congestive heart failure with anasarca: currently with significant volume overload -  Continue to monitor clinical status on Lasix 80 mg by mouth twice a day.  3. 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  - continue current insulin regimen.  4. Hypertension: blood pressure higher today at 166/77.  Continue Coreg and hydralazine for now.  Consider adding an ACE inhibitor or ARB once on dialysis.   LOS: 15 Wally Behan 11/17/20164:24 PM

## 2015-10-25 NOTE — Care Management Note (Signed)
I am continuing to monitor patients admission for progression to ESRD.  At this time I am starting together medical records in case place is required at discharge.  Karen ReiningKim Josie Rich   Dialysis Liaison  [phone]

## 2015-10-26 LAB — BASIC METABOLIC PANEL
ANION GAP: 7 (ref 5–15)
BUN: 71 mg/dL — ABNORMAL HIGH (ref 6–20)
CO2: 25 mmol/L (ref 22–32)
Calcium: 8.5 mg/dL — ABNORMAL LOW (ref 8.9–10.3)
Chloride: 103 mmol/L (ref 101–111)
Creatinine, Ser: 4.16 mg/dL — ABNORMAL HIGH (ref 0.44–1.00)
GFR calc Af Amer: 14 mL/min — ABNORMAL LOW (ref >60)
GFR, EST NON AFRICAN AMERICAN: 12 mL/min — AB (ref >60)
GLUCOSE: 152 mg/dL — AB (ref 65–99)
POTASSIUM: 4 mmol/L (ref 3.5–5.1)
Sodium: 135 mmol/L (ref 135–145)

## 2015-10-26 LAB — GLUCOSE, CAPILLARY
GLUCOSE-CAPILLARY: 208 mg/dL — AB (ref 65–99)
GLUCOSE-CAPILLARY: 272 mg/dL — AB (ref 65–99)
Glucose-Capillary: 192 mg/dL — ABNORMAL HIGH (ref 65–99)
Glucose-Capillary: 179 mg/dL — ABNORMAL HIGH (ref 65–99)

## 2015-10-26 MED ORDER — METHYLPREDNISOLONE SODIUM SUCC 40 MG IJ SOLR
40.0000 mg | Freq: Every day | INTRAMUSCULAR | Status: DC
  Administered 2015-10-26 – 2015-11-01 (×6): 40 mg via INTRAVENOUS
  Filled 2015-10-26 (×6): qty 1

## 2015-10-26 MED ORDER — AMLODIPINE BESYLATE 5 MG PO TABS
5.0000 mg | ORAL_TABLET | Freq: Every day | ORAL | Status: DC
  Administered 2015-10-26 – 2015-10-27 (×2): 5 mg via ORAL
  Filled 2015-10-26 (×2): qty 1

## 2015-10-26 NOTE — Evaluation (Signed)
Clinical/Bedside Swallow Evaluation Patient Details  Name: Karen Rich MRN: [id number] Date of Birth: [birth year]  Today's Date: 10/26/2015 Time: SLP Start Time (ACUTE ONLY): 1430 SLP Stop Time (ACUTE ONLY): 1530 SLP Time Calculation (min) (ACUTE ONLY): 60 min  Past Medical History:  Past Medical History  Diagnosis Date  . Edema   . Hyperlipidemia   . Hyperglycemia   . Fatigue   . Irregular heart beat   . Hypertension   . Diabetes mellitus without complication (HCC)   . Renal disorder   . Renal failure     stage 3   Past Surgical History:  Past Surgical History  Procedure Laterality Date  . None     HPI:  Pt is a 39 y.o. female with a known history of diabetes type 2, hyperlipidemia, hypertension, chronic kidney disease stage III who presents to the ED with complaint of swelling of her body diffusely. She reports that she stop taking all her medication including her insulin a few months ago indicating she was "overwhelmed" by the medications. She also reports that she's been having shortness of breath ongoing for the past few months and is unable to lay flat and has to sleep in a chair. Pt had a cardiorespiratory arrest was intubated for approximately for 3 days was extubated on the 10th of this month and since that time, she has had persistent hoarseness. She hasn't had hemoptysis but does have intermittent coughing. ENT consulted on 11.15.16 and assessment indicated hoarseness-likely related to intubation (traumatic) but that it should resolve with time. The sluggishness of the vocal folds is likely related to the multiple medical problems. Follow up as an outpatient rec'd in 3-4 weeks once the edema has resolved. ENT indicated that prednisone would be helpful for this edema but with her diabetes this was certainly raise her blood sugar. Primary MD consulted re: this information and will f/u w/ potential steroids for the laryngeal edema. Pt stated she is able to eat her meals but  does have intermittent coughing.   Assessment / Plan / Recommendation Clinical Impression  Pt was emergently intubated ~3 days sec. to cardiac arresst. ENT consulted on 11.15.16 and assessment indicated hoarseness-likely related to intubation (traumatic) but that it should resolve with time. The sluggishness of the vocal folds is likely related to the multiple medical problems. Follow up as an outpatient rec'd in 3-4 weeks once the edema has resolved. ENT indicated that prednisone would be helpful for this edema but with her diabetes this was certainly raise her blood sugar. Primary MD consulted re: this information and will f/u w/ potential steroids for the laryngeal edema. Pt stated she is able to eat her meals but does have intermittent coughing. Pt exhibited inconsistent overt, coughing w/ all trial consistencies assessed; it did not appear to make a difference the consistency. When pt used small, single sips of thin liquids via cup (slowly), no immediate coughing noted. Pt exhibited significant hoarseness w/ dysphonia at baseline and suspect pt's laryngeal presentation (per ENT note) is contributing to pt's inconsistent toleration of po's. Pt and family instructed on the need for STRICT aspiration precautions at this time w/ any po's; continue meds in puree w/ NSG. Rec'd pt to choose foods wisely for ease of swallowing. Will await MD to address pt's edema and f/u w/ continued education w/ pt on aspiration precautions. If pt is unable to tolerate po diet safely, then would rec. NPO status. MD consulted and agreed.     Aspiration Risk  Mild  aspiration risk;Moderate aspiration risk    Diet Recommendation  Regular diet (moistened foods); thin liquids via CUP w/ Strict aspiration precautions  Medication Administration: Whole meds with puree    Other  Recommendations Recommended Consults:  (ENT assessed) Oral Care Recommendations: Oral care BID;Patient independent with oral care   Follow up  Recommendations   (TBD)    Frequency and Duration min 3x week  1 week       Swallow Study   General Date of Onset: 10/10/15 HPI: Pt is a 39 y.o. female with a known history of diabetes type 2, hyperlipidemia, hypertension, chronic kidney disease stage III who presents to the ED with complaint of swelling of her body diffusely. She reports that she stop taking all her medication including her insulin a few months ago indicating she was "overwhelmed" by the medications. She also reports that she's been having shortness of breath ongoing for the past few months and is unable to lay flat and has to sleep in a chair. Pt had a cardiorespiratory arrest was intubated for approximately for 3 days was extubated on the 10th of this month and since that time, she has had persistent hoarseness. She hasn't had hemoptysis but does have intermittent coughing. ENT consulted on 11.15.16 and assessment indicated hoarseness-likely related to intubation (traumatic) but that it should resolve with time. The sluggishness of the vocal folds is likely related to the multiple medical problems. Follow up as an outpatient rec'd in 3-4 weeks once the edema has resolved. ENT indicated that prednisone would be helpful for this edema but with her diabetes this was certainly raise her blood sugar. Primary MD consulted re: this information and will f/u w/ potential steroids for the laryngeal edema. Pt stated she is able to eat her meals but does have intermittent coughing. Type of Study: Bedside Swallow Evaluation Previous Swallow Assessment: none Diet Prior to this Study: Regular;Thin liquids (at home) Temperature Spikes Noted: No Respiratory Status: Nasal cannula (2 liters) History of Recent Intubation: Yes Length of Intubations (days): 3 days Behavior/Cognition: Alert;Cooperative;Pleasant mood Oral Cavity Assessment: Within Functional Limits Oral Care Completed by SLP: No Oral Cavity - Dentition: Adequate natural  dentition Vision: Functional for self-feeding Self-Feeding Abilities: Able to feed self Patient Positioning: Upright in bed Baseline Vocal Quality: Hoarse;Breathy (dysphonia) Volitional Cough: Weak Volitional Swallow: Able to elicit    Oral/Motor/Sensory Function Overall Oral Motor/Sensory Function: Within functional limits   Ice Chips     Thin Liquid      Nectar Thick     Honey Thick Honey Thick Liquid: Not tested   Puree Puree: Within functional limits Presentation: Spoon (2 trials)   Solid Solid: Not tested Other Comments: but ate a (whole) hamburger during lunch w/ only intermittent cough noted by family member and NSG      Jerilynn SomKatherine Tona Qualley, MS, CCC-SLP  Osmani Kersten 10/26/2015,3:51 PM

## 2015-10-26 NOTE — Clinical Social Work Note (Signed)
Late entry 10/26/15 for 10/24/15 Clinical Social Work Assessment  Patient Details  Name: Karen Rich MRN: [id number] Date of Birth: May 04, 1976  Date of referral:  10/22/15               Reason for consult:  Facility Placement                Permission sought to share information with:  Case Manager, Customer service manager, Family Supports Permission granted to share information::  Yes, Verbal Permission Granted  Name::        Agency::  SNFs  Relationship::  sister  Contact Information:     Housing/Transportation Living arrangements for the past 2 months:  [place] of Information:  Patient, Medical Team, Case Manager Patient Interpreter Needed:  None Criminal Activity/Legal Involvement Pertinent to Current Situation/Hospitalization:  No - Comment as needed Significant Relationships:  Parents, Siblings Lives with:  Minor Children, Parents, Siblings Do you feel safe going back to the place where you live?  Yes Need for family participation in patient care:  Yes (Comment)  Care giving concerns:  Pt lives with her mother, sister, 79 year old nephew and 56 year old daughter. [name] works during the day at [employer] denies ever needing care giving in the past.    Facilities manager / plan:  CSW met with Pt to discuss dc planning. Pt is single, has 43 year old daughter at home, lives with mother and sister as well. 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 Pt has been seen by PT and is severely deconditioned so the recommendation is SNF. Pt does not have insurance to cover SNF, she is not on disability either. CSW discussed with Pt the need to apply for disability to help her obtain the care she needs for her disease while she is unable to work. Pt's medicaid is family and children medicaid. Pt is from [place], does not have family outside of the area. Pt would  prefer to go home at discharge. Pt will only qualify for medicaid with SNF coverage if she applies for disability. CSW discussed extensively with care manager as well. Pt continues to get medical work up, [place] will continue to follow. CSW recommends maximizing therapies while Pt is in the hospital, as well as maximize opportunities to help Pt move around the room with staff.   Employment status:  Psychologist, counselling:  Medicaid In [place] PT Recommendations:  [place] / Referral to community resources:  [place]  Patient/Family's Response to care:  [name] at work during [place] visit. Pt engaged with tx team.  Patient/Family's Understanding of and Emotional Response to Diagnosis, Current Treatment, and Prognosis:  Pt states that her issues started with her kidneys. She is motivated to work with therapy, but is limited by her condition. Pt's family not visiting during the day due to being at work.    Emotional Assessment Appearance:  Appears younger than stated age Attitude/Demeanor/Rapport:   (engaged, cooperative) Affect (typically observed):  Adaptable Orientation:  Oriented to Self, Oriented to Place, Oriented to  Time, Oriented to Situation Alcohol / Substance use:  Never Used Psych involvement (Current and /or in the community):  No (Comment)  Discharge Needs  Concerns to be addressed:  Adjustment to Illness, Discharge Planning Concerns Readmission within the last 30 days:  No Current discharge risk:  Dependent with  Mobility, Inadequate Financial Supports, Chronically ill Barriers to Discharge:  Continued Medical Work up   [employer], LCSW 10/26/2015, 10:14 AM

## 2015-10-26 NOTE — Progress Notes (Signed)
[facility] - [place] at [facility]   PATIENT NAME: Karen Rich    MR#:  [id number]  DATE OF BIRTH:  [birth year]  SUBJECTIVE:   Patient still has some significant volume overload. Also noted to have a raspy voice with some stridor. No chest pain, nausea, vomiting. Seems to have a cough while trying to eat  REVIEW OF SYSTEMS:    Review of Systems  Constitutional: Negative for fever and chills.  HENT: Negative for congestion and tinnitus.   Eyes: Negative for blurred vision and double vision.  Respiratory: Positive for cough, shortness of breath and stridor. Negative for wheezing.   Cardiovascular: Positive for leg swelling. Negative for chest pain, orthopnea and PND.  Gastrointestinal: Negative for nausea, vomiting, abdominal pain and diarrhea.  Genitourinary: Negative for dysuria and hematuria.  Neurological: Positive for weakness (generalized). Negative for dizziness, sensory change and focal weakness.  All other systems reviewed and are negative.   Nutrition: Renal Tolerating Diet: Yes Tolerating PT: Eval noted.    DRUG ALLERGIES:  No Known Allergies  VITALS:  Blood pressure 115/63, pulse 74, temperature 97.8 F (36.6 C), temperature source Oral, resp. rate 20, height 5' 3.5" (1.613 m), weight 94.53 kg (208 lb 6.4 oz), last menstrual period 09/08/2015, SpO2 100 %.  PHYSICAL EXAMINATION:   Physical Exam  GENERAL:  39 y.o.-year-old patient lying in the bed in mild resp. Distress.  EYES: Pupils equal, round, reactive to light and accommodation. No scleral icterus. Extraocular muscles intact.  HEENT: Head atraumatic, normocephalic. Oropharynx and nasopharynx clear.  NECK:  Supple, no jugular venous distention. No thyroid enlargement, no tenderness.  LUNGS: Positive use of accessory muscles, positive stridor. Good air entry bilaterally.  Bibasilar Rales CARDIOVASCULAR: S1, S2 normal. No murmurs, rubs, or gallops.  ABDOMEN: Soft, nontender,  nondistended. Bowel sounds present. No organomegaly or mass.  EXTREMITIES: No cyanosis, clubbing, +1-2 pitting edema bilaterally and generalized anasarca. NEUROLOGIC: Cranial nerves II through XII are intact. No focal Motor or sensory deficits b/l.  Globally weak PSYCHIATRIC: The patient is alert and oriented x 3.  SKIN: No obvious rash, lesion, or ulcer.    LABORATORY PANEL:   CBC  Recent Labs Lab 10/25/15 0414 10/25/15 1817  HGB 7.2*  --   PLT  --  346   ------------------------------------------------------------------------------------------------------------------  Chemistries   Recent Labs Lab 10/26/15 0939  NA 135  K 4.0  CL 103  CO2 25  GLUCOSE 152*  BUN 71*  CREATININE 4.16*  CALCIUM 8.5*   ------------------------------------------------------------------------------------------------------------------  Cardiac Enzymes No results for input(s): TROPONINI in the last 168 hours. ------------------------------------------------------------------------------------------------------------------  RADIOLOGY:  No results found.   ASSESSMENT AND PLAN:   39 year old female with past medical history of combined diastolic/systolic CHF, CKD stage III, hypertension, diabetes, who originally presented to the hospital due to shortness of breath, swelling and noted to be in acute on chronic respiratory failure and in CHF.  #1 acute on chronic respiratory failure with hypoxia-this is likely secondary to volume overload and CHF. -Continue diuresis with Lasix but patient is slow to respond given her renal failure. -Continue O2 supplementation.  #2 acute on chronic combined systolic/diastolic CHF-clinically patient is still volume overloaded. She is slow to respond to diuresis given her chronic renal failure. -Continue Lasix, Coreg, Imdur, hydralazine. -Appreciate cardiology input -Seen by nephrology and the plan on starting on hemodialysis and she is in agreement.  #3  acute on chronic renal failure-this is slow to improve. 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 Part  of her renal function is worse from cardiorenal and her ischemic cardiomyopathy.   -She is slow to respond with IV diuresis and continues to be volume overloaded. Appreciate nephrology input and the plan to start her on hemodialysis and she is in agreement.  #4 stridor/raspy voice-this is thought to be related to trauma from her being intubated. She probably has some mild vocal cord swelling as per ENT. -Appreciate speech evaluation and I'll start her on some low-dose IV steroids. Follow clinically.  #5 diabetes type 2 without renal complications-continue sliding scale insulin.  #6 hypertension-continue Coreg, hydralazine, Imdur.  All the records are reviewed and case discussed with Care Management/Social Workerr. Management plans discussed with the patient, family and they are in agreement.  CODE STATUS: Full  DVT Prophylaxis: Heparin subcutaneous  TOTAL TIME TAKING CARE OF THIS PATIENT: 40 minutes.   POSSIBLE D/C IN 2-3 DAYS, DEPENDING ON CLINICAL CONDITION.   [place] SirenSAINANI,Ande Therrell J M.D on 10/26/2015 at 3:57 PM  Between 7am to 6pm - Pager - [phone]  After 6pm go to [email or web address] - password [email or web address]  [facility]  Office  [phone]  CC: Primary care physician; Sherrie MustacheFayegh Jadali, MD

## 2015-10-26 NOTE — Progress Notes (Signed)
Patient: Karen Rich / Admit Date: 10/10/2015 / Date of Encounter: 10/26/2015, 9:10 AM   Subjective: She continues to be hoarse. She appears to be very deconditioned.  Review of Systems: Review of Systems  Constitutional: Positive for weight loss and malaise/fatigue. Negative for fever, chills and diaphoresis.  HENT: Positive for sore throat. Negative for congestion.   Eyes: Negative for discharge and redness.  Respiratory: Positive for cough, sputum production and shortness of breath. Negative for hemoptysis and wheezing.   Cardiovascular: Positive for leg swelling. Negative for chest pain, palpitations, orthopnea, claudication and PND.  Gastrointestinal: Negative for nausea and vomiting.  Musculoskeletal: Negative for falls.  Neurological: Positive for weakness. Negative for dizziness, sensory change, speech change, focal weakness and loss of consciousness.  Endo/Heme/Allergies: Does not bruise/bleed easily.  Psychiatric/Behavioral: The patient is not nervous/anxious.      Objective: Telemetry: NSR, 70's Physical Exam: Blood pressure 164/76, pulse 83, temperature 98.4 F (36.9 C), temperature source Oral, resp. rate 20, height 5' 3.5" (1.613 m), weight 208 lb 6.4 oz (94.53 kg), last menstrual period 09/08/2015, SpO2 100 %. Body mass index is 36.33 kg/(m^2). General: Well developed, well nourished, in no acute distress. Head: Normocephalic, atraumatic, sclera non-icteric, no xanthomas, nares are without discharge. Neck: Negative for carotid bruits. JVP 10+. Lungs: Resp regular and mildly labored, diffuse rails, decreased breath sounds at the bases bilaterally. Heart: RRR S1 S2 without murmurs, rubs, or gallops.  Abdomen: Soft, non-tender, non-distended with normoactive bowel sounds. No rebound/guarding. Extremities: No clubbing or cyanosis. 2+ edema. Distal pedal pulses are 2+ and equal bilaterally. Neuro: Alert and oriented X 3. Moves all extremities spontaneously. Psych:   Responds to questions appropriately with a normal affect.   Intake/Output Summary (Last 24 hours) at 10/26/15 0910 Last data filed at 10/25/15 2033  Gross per 24 hour  Intake    240 ml  Output    400 ml  Net   -160 ml    Inpatient Medications:  . antiseptic oral rinse  7 mL Mouth Rinse BID  . carvedilol  25 mg Oral BID WC  . collagenase   Topical Daily  . fluticasone  2 spray Each Nare Daily  . furosemide  80 mg Oral BID  . heparin subcutaneous  5,000 Units Subcutaneous 3 times per day  . hydrALAZINE  50 mg Oral 3 times per day  . insulin aspart  0-15 Units Subcutaneous TID WC  . insulin aspart  0-5 Units Subcutaneous QHS  . isosorbide mononitrate  60 mg Oral Daily  . sodium chloride  10-40 mL Intracatheter Q12H   Infusions:    Labs:  Recent Labs  10/24/15 0954 10/25/15 0414  NA 135 136  K 3.7 3.9  CL 102 102  CO2 24 24  GLUCOSE 207* 149*  BUN 72* 70*  CREATININE 4.64* 4.43*  CALCIUM 8.4* 8.7*   No results for input(s): AST, ALT, ALKPHOS, BILITOT, PROT, ALBUMIN in the last 72 hours.  Recent Labs  10/25/15 0414 10/25/15 1817  HGB 7.2*  --   PLT  --  346   No results for input(s): CKTOTAL, CKMB, TROPONINI in the last 72 hours. Invalid input(s): POCBNP No results for input(s): HGBA1C in the last 72 hours.   Weights: Filed Weights   10/24/15 0442 10/25/15 0626 10/26/15 0440  Weight: 197 lb 1.6 oz (89.404 kg) 211 lb (95.709 kg) 208 lb 6.4 oz (94.53 kg)     Radiology/Studies:  Dg Chest 2 View  10/20/2015  CLINICAL  DATA:  Pleural effusion, weakness, shortness of breath and cough. EXAM: CHEST  2 VIEW COMPARISON:  Chest x-ray dated 10/16/2015. FINDINGS: Endotracheal tube has been removed in the interval. Right-sided central line remains with tip well-positioned in the expected location of the cavoatrial junction. Mild cardiomegaly is unchanged. There is mild central pulmonary vascular congestion but this appears improved compared to the previous study suggesting  improved fluid status. There is significantly improved aeration throughout the right lung. Improved aeration also noted at the left lung base. Small residual opacities at each lung base are probably mild atelectasis. Also suspect small residual left pleural effusion. No new lung abnormality. IMPRESSION: Significant interval improvement as detailed above. Electronically Signed   By: Bary Richard M.D.   On: 10/20/2015 12:29   Dg Chest 2 View  10/10/2015  CLINICAL DATA:  Short of breath and edema EXAM: CHEST  2 VIEW COMPARISON:  07/10/2013 FINDINGS: Lungs are severely under aerated. Small pleural effusions are noted on the lateral view. There is increased opacity towards the lung bases favored represent volume loss. Developing airspace disease is not excluded. Pulmonary vasculature is within normal limits. IMPRESSION: Low lung volumes. Small bilateral pleural effusions Bibasilar volume loss. Electronically Signed   By: Jolaine Click M.D.   On: 10/10/2015 15:46   Ct Head Wo Contrast  10/18/2015  CLINICAL DATA:  Altered mental status.  Lethargy. EXAM: CT HEAD WITHOUT CONTRAST TECHNIQUE: Contiguous axial images were obtained from the base of the skull through the vertex without intravenous contrast. COMPARISON:  None. FINDINGS: Gray-white differentiation is maintained. No CT evidence of acute large territory infarct. No intraparenchymal or extra-axial mass or hemorrhage. Normal size a configuration of the ventricles and basilar cisterns. No midline shift. Limited visualization the paranasal emesis and mastoid air cells is normal. No air-fluid levels. Regional soft tissues appear normal. No displaced calvarial fracture. IMPRESSION: Negative noncontrast head CT. Electronically Signed   By: Simonne Come M.D.   On: 10/18/2015 21:17   US Renal  10/21/2015  CLINICAL DATA:  39 year old female with acute renal failure. EXAM: RENAL / URINARY TRACT ULTRASOUND COMPLETE COMPARISON:  10/10/2015 and prior ultrasounds  FINDINGS: Right Kidney: Length: 11.0 cm. Increased renal echogenicity noted. There is no evidence of hydronephrosis or solid renal mass. Left Kidney: Length: 11.9 cm. Increased renal echogenicity noted. There is no evidence of hydronephrosis or solid renal mass. An 8 mm echogenic focus within the lower left kidney could represent a nonobstructing calculus. Bladder: A Foley catheter is noted within a collapsed bladder. IMPRESSION: Increased renal echogenicity compatible with medical renal disease. No evidence of hydronephrosis. Possible 8 mm nonobstructing left lower pole renal calculus. Foley catheter within the bladder. Electronically Signed   By: Harmon Pier M.D.   On: 10/21/2015 14:54   US Renal  10/10/2015  CLINICAL DATA:  Acute renal failure. EXAM: RENAL / URINARY TRACT ULTRASOUND COMPLETE COMPARISON:  07/10/2013 chest CT. FINDINGS: Right Kidney: Length: 9.3 cm. Echogenic right kidney with no right hydronephrosis and no right renal mass. No right renal cortical atrophy. Left Kidney: Length: 10.7 cm. Echogenic left kidney with no left hydronephrosis and no left renal mass. No left renal cortical atrophy. Incidentally noted is a small left pleural effusion. Bladder: Appears normal for degree of bladder distention. IMPRESSION: 1. No hydronephrosis. 2. Echogenic normal size kidneys, indicating nonspecific renal parenchymal disease of uncertain chronicity. 3. Incidental small left pleural effusion. 4. Normal bladder. Electronically Signed   By: Delbert Phenix M.D.   On: 10/10/2015 18:59  Dg Chest Port 1 View  10/16/2015  CLINICAL DATA:  Respiratory failure.  Intubated. EXAM: PORTABLE CHEST 1 VIEW COMPARISON:  10/15/2015 FINDINGS: Endotracheal tube remains near the carina with the tip approximately 14 mm above the carina. There is cardiomegaly. Moderate layering right pleural effusion. Bilateral vascular congestion and airspace opacities could reflect edema. Mild cardiomegaly. Low lung volumes. IMPRESSION:  Endotracheal tube just above the carina approximately 14 mm. Layering right effusion. Bilateral airspace opacities likely reflects edema. Low volumes with cardiomegaly. Electronically Signed   By: Charlett Nose M.D.   On: 10/16/2015 11:55   Dg Chest Port 1 View  10/15/2015  CLINICAL DATA:  Status post intubation. EXAM: PORTABLE CHEST 1 VIEW COMPARISON:  Earlier film, same date. FINDINGS: External pacer paddles are noted. The right IJ center venous catheter tip is in the right atrium and could be retracted 2 cm. The endotracheal tube is right at the carina. Very low lung volumes with vascular crowding atelectasis. Stable cardiac enlargement and pulmonary edema. IMPRESSION: 1. The endotracheal tube is right at the carina and should be retracted approximately 2-3 cm. 2. Right IJ central venous catheter tip is in the right atrium. This could be retracted 2-3 cm. 3. Low lung volumes with vascular crowding, atelectasis and persistent mild interstitial edema. Electronically Signed   By: Rudie Meyer M.D.   On: 10/15/2015 19:26   Dg Chest Port 1 View  10/15/2015  CLINICAL DATA:  Central line placement.  Readjustment. EXAM: PORTABLE CHEST 1 VIEW COMPARISON:  10/15/2015 FINDINGS: Right central line tip is near the cavoatrial junction. Decreasing lung volumes with low lung volumes increasing bilateral lower lobe opacities. Cardiomegaly. No pneumothorax. Mild vascular congestion. IMPRESSION: Retraction of the right central line with the tip now near the cavoatrial junction. Cardiomegaly, vascular congestion. Decreasing lung volumes with bibasilar atelectasis or infiltrates. Electronically Signed   By: Charlett Nose M.D.   On: 10/15/2015 14:46   Dg Chest Port 1 View  10/15/2015  CLINICAL DATA:  Central line placement EXAM: PORTABLE CHEST 1 VIEW COMPARISON:  10/10/2015 FINDINGS: Enlarged cardiac silhouette. Hazy attenuation in the perihilar region bilaterally suggesting the possibility of pulmonary edema. Right internal  jugular central line identified with tip extending about 3 cm into the right atrium. No pneumothorax. IMPRESSION: No pneumothorax.  Central line extends into right atrium. Electronically Signed   By: Esperanza Heir M.D.   On: 10/15/2015 13:51     Assessment and Plan   1. Acute on chronic combined systolic/diastolic chf:  She is still significantly fluid overloaded. Repeat echo on 11/16 showed normal EF, GR2DD, elevated left heart pressure. Continue Lasix 80 mg twice daily. Urine output improved after resuming this regimen. She appears to be at least 15-20 pounds above dry weight.  2. PEA arrest:  Thought primarily to be a respiratory event. Plan for outpt w/u to r/o ischemia.   3. Essential HTN:  Continue Hydral/nitrate and beta blocker. I added amlodipine 5 mg once daily.  4. CKD III:  Creat stable. Renal following.  I suspect that she will require hemodialysis in the very near future.  5. Disposition: The patient wants to go home. However, she appears to be extremely deconditioned. I recommend aggressive physical therapy and possible short-term rehabilitation.  if the patient goes home, she would be at high risk for readmission.  Signed, Lorine Bears, MD   10/26/2015, 9:10 AM

## 2015-10-26 NOTE — Progress Notes (Signed)
CSW met with Pt to see how she is progressing. Pt was up in recliner today, low mood, stated that she wants to go home. CSW offered support. No family in the room at this time.   Toma Copier, [place]

## 2015-10-26 NOTE — Progress Notes (Signed)
Subjective:  Patient continues to be significantly volume overloaded. Now back on diuretic therapy. Creatinine currently down to 4.1 however overall EGFR remains quite low.  Discussed the potential for starting dialysis with the patient and she appears to be agreeable to this.  Objective:  Vital signs in last 24 hours:  Temp:  [97.3 F (36.3 C)-98.4 F (36.9 C)] 97.8 F (36.6 C) (11/18 1143) Pulse Rate:  [74-83] 74 (11/18 1143) Resp:  [20-22] 20 (11/18 1143) BP: (115-164)/(63-76) 115/63 mmHg (11/18 1143) SpO2:  [98 %-100 %] 100 % (11/18 1143) Weight:  [94.53 kg (208 lb 6.4 oz)] 94.53 kg (208 lb 6.4 oz) (11/18 0440)  Weight change: -1.179 kg (-2 lb 9.6 oz) Filed Weights   10/24/15 0442 10/25/15 0626 10/26/15 0440  Weight: 89.404 kg (197 lb 1.6 oz) 95.709 kg (211 lb) 94.53 kg (208 lb 6.4 oz)    Intake/Output:    Intake/Output Summary (Last 24 hours) at 10/26/15 1248 Last data filed at 10/26/15 1100  Gross per 24 hour  Intake    480 ml  Output    400 ml  Net     80 ml     Physical Exam: General: No acute distress  HEENT [place]/AT EOMI hearing intact  Neck Supple trachea midline  Pulm/lungs Mild bibasilar rales normal effort  CVS/Heart S1S2 no rubs  Abdomen:  +distended, +abdominal wall edema  Extremities: Tight 2+ b/l LE edema  Neurologic: Alert, oriented x 3, follows comamnds  Skin: Excoriations on b/l LE's noted          Basic Metabolic Panel:   Recent Labs Lab 10/22/15 0520 10/23/15 0600 10/24/15 0954 10/25/15 0414 10/26/15 0939  NA 141 138 135 136 135  K 3.7 3.5 3.7 3.9 4.0  CL 106 103 102 102 103  CO2 _0 GLUCOSE 157* 163* 207* 149* 152*  BUN 65* 70* 72* 70* 71*  CREATININE 4.12* 4.16* 4.64* 4.43* 4.16*  CALCIUM 8.2* 8.6* 8.4* 8.7* 8.5*     CBC:  Recent Labs Lab 10/25/15 0414 10/25/15 1817  HGB 7.2*  --   PLT  --  346      Microbiology:  Recent Results (from the past 720 hour(s))  C difficile quick scan w PCR reflex      Status: None   Collection Time: 10/10/15 10:21 PM  Result Value Ref Range Status   C Diff antigen NEGATIVE NEGATIVE Final   C Diff toxin NEGATIVE NEGATIVE Final   C Diff interpretation Negative for C. difficile  Final  C difficile quick scan w PCR reflex     Status: None   Collection Time: 10/12/15 10:35 AM  Result Value Ref Range Status   C Diff antigen NEGATIVE NEGATIVE Final   C Diff toxin NEGATIVE NEGATIVE Final   C Diff interpretation Negative for C. difficile  Final  MRSA PCR Screening     Status: None   Collection Time: 10/17/15  9:02 AM  Result Value Ref Range Status   MRSA by PCR NEGATIVE NEGATIVE Final    Comment:        The GeneXpert MRSA Assay (FDA approved for NASAL specimens only), is one component of a comprehensive MRSA colonization surveillance program. It is not intended to diagnose MRSA infection nor to guide or monitor treatment for MRSA infections.   C difficile quick scan w PCR reflex     Status: None   Collection Time: 10/18/15  5:50 AM  Result Value Ref Range Status  C Diff antigen NEGATIVE NEGATIVE Final   C Diff toxin NEGATIVE NEGATIVE Final   C Diff interpretation Negative for C. difficile  Final    Coagulation Studies: No results for input(s): LABPROT, INR in the last 72 hours.  Urinalysis: No results for input(s): COLORURINE, LABSPEC, PHURINE, GLUCOSEU, HGBUR, BILIRUBINUR, KETONESUR, PROTEINUR, UROBILINOGEN, NITRITE, LEUKOCYTESUR in the last 72 hours.  Invalid input(s): APPERANCEUR    Imaging: No results found.   Medications:     . amLODipine  5 mg Oral Daily  . antiseptic oral rinse  7 mL Mouth Rinse BID  . carvedilol  25 mg Oral BID WC  . collagenase   Topical Daily  . fluticasone  2 spray Each Nare Daily  . furosemide  80 mg Oral BID  . heparin subcutaneous  5,000 Units Subcutaneous 3 times per day  . hydrALAZINE  50 mg Oral 3 times per day  . insulin aspart  0-15 Units Subcutaneous TID WC  . insulin aspart  0-5 Units  Subcutaneous QHS  . isosorbide mononitrate  60 mg Oral Daily  . sodium chloride  10-40 mL Intracatheter Q12H   sodium chloride, acetaminophen **OR** acetaminophen, hydrALAZINE, loperamide, ondansetron **OR** ondansetron (ZOFRAN) IV, sodium chloride, sodium chloride  Assessment/ Plan:  39 y.o. 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  1. 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 Acute renal failure from acute cardiorenal syndrome from systolic acute congestive heart failure  Echocardiogram from 11/3 with EF of 40-45%. Also with hypotension during cardiac arrest.  Chronic kidney disease with proteinuria (nephrotic range: 4.9 grams) secondary to diabetic nephropathy.  Baseline creatinine of 1.2 in 08/2014. Unclear how much progression of disease. -overall the patient continues to have quite low renal function with an EGFR of 13.  She continues to be significantly volume overloaded.  She will likely benefit from dialysis.  This was discussed in depth with the patient and we discussed risks, benefits, and alternatives to dialysis.  She was agreeable with proceeding.  2. Acute exacerbation of systolic congestive heart failure with anasarca: currently with significant volume overload -  Most recent echocardiogram showed improvement.  However she remains significantly volume overloaded.  As above we will proceed with dialysis..  3. 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  - continue current insulin regimen.  4. Hypertension: amlodipine added to her medication regimen.   LOS: 16 Raffi Milstein 11/18/201612:48 PM

## 2015-10-26 NOTE — Care Management (Signed)
High potential for dialysis.  Primary nurse reported consult has been made to vascular for temp dialysis cath.  Asked that she report to vascular that if all possible not to put it in groin so patient can ambulate.  Patient is in a debilitated state and needs to be able to continue to work with patient.  She wishes to discharge home and not a facility and would not be able to have home physical therapy under medicaid

## 2015-10-27 LAB — CBC
HEMATOCRIT: 23.3 % — AB (ref 35.0–47.0)
Hemoglobin: 7.4 g/dL — ABNORMAL LOW (ref 12.0–16.0)
MCH: 26.9 pg (ref 26.0–34.0)
MCHC: 31.6 g/dL — AB (ref 32.0–36.0)
MCV: 85.1 fL (ref 80.0–100.0)
Platelets: 379 10*3/uL (ref 150–440)
RBC: 2.74 MIL/uL — AB (ref 3.80–5.20)
RDW: 15.9 % — ABNORMAL HIGH (ref 11.5–14.5)
WBC: 8.6 10*3/uL (ref 3.6–11.0)

## 2015-10-27 LAB — BASIC METABOLIC PANEL
Anion gap: 10 (ref 5–15)
BUN: 77 mg/dL — AB (ref 6–20)
CHLORIDE: 102 mmol/L (ref 101–111)
CO2: 26 mmol/L (ref 22–32)
Calcium: 8.9 mg/dL (ref 8.9–10.3)
Creatinine, Ser: 4.29 mg/dL — ABNORMAL HIGH (ref 0.44–1.00)
GFR calc Af Amer: 14 mL/min — ABNORMAL LOW (ref >60)
GFR calc non Af Amer: 12 mL/min — ABNORMAL LOW (ref >60)
GLUCOSE: 275 mg/dL — AB (ref 65–99)
POTASSIUM: 4 mmol/L (ref 3.5–5.1)
SODIUM: 138 mmol/L (ref 135–145)

## 2015-10-27 LAB — GLUCOSE, CAPILLARY
GLUCOSE-CAPILLARY: 263 mg/dL — AB (ref 65–99)
GLUCOSE-CAPILLARY: 221 mg/dL — AB (ref 65–99)
GLUCOSE-CAPILLARY: 282 mg/dL — AB (ref 65–99)
Glucose-Capillary: 222 mg/dL — ABNORMAL HIGH (ref 65–99)
Glucose-Capillary: 371 mg/dL — ABNORMAL HIGH (ref 65–99)

## 2015-10-27 NOTE — Consult Note (Signed)
Reason for Consult:ARF and Anasarca Referring Physician: Dr. Sallye Ober is an 39 y.o. female.  HPI: DM, some renal failure in the past. Presents with ARF and significant fluid overload requiring HD.  Past Medical History  Diagnosis Date  . Edema   . Hyperlipidemia   . Hyperglycemia   . Fatigue   . Irregular heart beat   . Hypertension   . Diabetes mellitus without complication ([place])   . Renal disorder   . Renal failure     stage 3    Past Surgical History  Procedure Laterality Date  . None      Family History  Problem Relation Age of Onset  . Hypertension Mother   . Diabetes Mother   . Hypertension Father   . CVA Father     Social History:  reports that she has never smoked. She does not have any smokeless tobacco history on file. She reports that she does not drink alcohol or use illicit drugs.  Allergies: No Known Allergies  Medications: I have reviewed the patient's current medications.  Results for orders placed or performed during the hospital encounter of 10/10/15 (from the past 48 hour(s))  Glucose, capillary     Status: Abnormal   Collection Time: 10/25/15 11:54 AM  Result Value Ref Range   Glucose-Capillary 153 (H) 65 - 99 mg/dL  Glucose, capillary     Status: Abnormal   Collection Time: 10/25/15  4:40 PM  Result Value Ref Range   Glucose-Capillary 150 (H) 65 - 99 mg/dL  Platelet count     Status: None   Collection Time: 10/25/15  6:17 PM  Result Value Ref Range   Platelets 346 150 - 440 K/uL  Glucose, capillary     Status: Abnormal   Collection Time: 10/25/15  8:58 PM  Result Value Ref Range   Glucose-Capillary 167 (H) 65 - 99 mg/dL   Comment 1 Notify RN   Glucose, capillary     Status: Abnormal   Collection Time: 10/26/15  7:35 AM  Result Value Ref Range   Glucose-Capillary 192 (H) 65 - 99 mg/dL  Basic metabolic panel     Status: Abnormal   Collection Time: 10/26/15  9:39 AM  Result Value Ref Range   Sodium 135 135 - 145 mmol/L    Potassium 4.0 3.5 - 5.1 mmol/L   Chloride 103 101 - 111 mmol/L   CO2 25 22 - 32 mmol/L   Glucose, Bld 152 (H) 65 - 99 mg/dL   BUN 71 (H) 6 - 20 mg/dL   Creatinine, Ser 4.16 (H) 0.44 - 1.00 mg/dL   Calcium 8.5 (L) 8.9 - 10.3 mg/dL   GFR calc non Af Amer 12 (L) >60 mL/min   GFR calc Af Amer 14 (L) >60 mL/min    Comment: (NOTE) The eGFR has been calculated using the CKD EPI equation. This calculation has not been validated in all clinical situations. eGFR's persistently <60 mL/min signify possible Chronic Kidney Disease.    Anion gap 7 5 - 15  Glucose, capillary     Status: Abnormal   Collection Time: 10/26/15 11:34 AM  Result Value Ref Range   Glucose-Capillary 179 (H) 65 - 99 mg/dL  Glucose, capillary     Status: Abnormal   Collection Time: 10/26/15  4:03 PM  Result Value Ref Range   Glucose-Capillary 208 (H) 65 - 99 mg/dL  Glucose, capillary     Status: Abnormal   Collection Time: 10/26/15 10:18 PM  Result Value Ref Range   Glucose-Capillary 272 (H) 65 - 99 mg/dL   Comment 1 Notify RN   CBC     Status: Abnormal   Collection Time: 10/27/15  5:00 AM  Result Value Ref Range   WBC 8.6 3.6 - 11.0 K/uL   RBC 2.74 (L) 3.80 - 5.20 MIL/uL   Hemoglobin 7.4 (L) 12.0 - 16.0 g/dL   HCT 23.3 (L) 35.0 - 47.0 %   MCV 85.1 80.0 - 100.0 fL   MCH 26.9 26.0 - 34.0 pg   MCHC 31.6 (L) 32.0 - 36.0 g/dL   RDW 15.9 (H) 11.5 - 14.5 %   Platelets 379 150 - 440 K/uL  Basic metabolic panel     Status: Abnormal   Collection Time: 10/27/15  5:00 AM  Result Value Ref Range   Sodium 138 135 - 145 mmol/L   Potassium 4.0 3.5 - 5.1 mmol/L   Chloride 102 101 - 111 mmol/L   CO2 26 22 - 32 mmol/L   Glucose, Bld 275 (H) 65 - 99 mg/dL   BUN 77 (H) 6 - 20 mg/dL   Creatinine, Ser 4.29 (H) 0.44 - 1.00 mg/dL   Calcium 8.9 8.9 - 10.3 mg/dL   GFR calc non Af Amer 12 (L) >60 mL/min   GFR calc Af Amer 14 (L) >60 mL/min    Comment: (NOTE) The eGFR has been calculated using the CKD EPI equation. This  calculation has not been validated in all clinical situations. eGFR's persistently <60 mL/min signify possible Chronic Kidney Disease.    Anion gap 10 5 - 15  Glucose, capillary     Status: Abnormal   Collection Time: 10/27/15  8:25 AM  Result Value Ref Range   Glucose-Capillary 263 (H) 65 - 99 mg/dL    No results found.  Review of Systems  Constitutional: Positive for malaise/fatigue.  HENT: Negative.   Eyes: Negative.   Respiratory: Positive for cough and shortness of breath.   Cardiovascular: Positive for orthopnea and leg swelling.  Gastrointestinal: Negative.   Musculoskeletal: Negative.   Skin: Negative.   Neurological: Negative.   Endo/Heme/Allergies: Negative.   Psychiatric/Behavioral: Negative.    Blood pressure 153/78, pulse 86, temperature 98.4 F (36.9 C), temperature source Oral, resp. rate 16, height 5' 3.5" (1.613 m), weight 94.167 kg (207 lb 9.6 oz), last menstrual period 09/08/2015, SpO2 93 %. Physical Exam  Constitutional: She is oriented to person, place, and time. She appears well-developed. No distress.  HENT:  Head: Normocephalic.  Neck: Normal range of motion. Neck supple.  Cardiovascular: Normal rate, regular rhythm and normal heart sounds.   Respiratory: She has wheezes. She has rales.  GI: Soft. She exhibits distension.  edematous  Musculoskeletal: She exhibits edema.  Neurological: She is alert and oriented to person, place, and time.  Skin: Skin is warm.  edema    Assessment/Plan:  ARF with significant fluid overload Will require temporary HD, will place Trialysis Catheter If no significant improvement after 1-2 runs of HD with require permanent HD access.  Consent obtained from sister. Patient and family express understanding. Will proceed.  Esco, Miechia A 10/27/2015, 11:27 AM

## 2015-10-27 NOTE — Progress Notes (Signed)
[facility] [place] [facility]   PATIENT NAME: Karen SailsCamille Hitzeman    MR#:  [id number]  DATE OF BIRTH:  [birth year]  SUBJECTIVE:   Patient still has some significant volume overload. Stridor and cough has improved since yesterday.  No chest pain, nausea, vomiting. Going to have temp. Dialysis cath placed today to start HD.   REVIEW OF SYSTEMS:    Review of Systems  Constitutional: Negative for fever and chills.  HENT: Negative for congestion and tinnitus.   Eyes: Negative for blurred vision and double vision.  Respiratory: Positive for cough, shortness of breath and stridor. Negative for wheezing.   Cardiovascular: Positive for leg swelling. Negative for chest pain, orthopnea and PND.  Gastrointestinal: Negative for nausea, vomiting, abdominal pain and diarrhea.  Genitourinary: Negative for dysuria and hematuria.  Neurological: Positive for weakness (generalized). Negative for dizziness, sensory change and focal weakness.  All other systems reviewed and are negative.   Nutrition: Renal Tolerating Diet: Yes Tolerating PT: Eval noted.    DRUG ALLERGIES:  No Known Allergies  VITALS:  Blood pressure 153/78, pulse 86, temperature 98.4 F (36.9 C), temperature source Oral, resp. rate 16, height 5' 3.5" (1.613 m), weight 94.167 kg (207 lb 9.6 oz), last menstrual period 09/08/2015, SpO2 93 %.  PHYSICAL EXAMINATION:   Physical Exam  GENERAL:  39 y.o.-year-old patient lying in the bed in NAD.   EYES: Pupils equal, round, reactive to light and accommodation. No scleral icterus. Extraocular muscles intact.  HEENT: Head atraumatic, normocephalic. Oropharynx and nasopharynx clear.  NECK:  Supple, no jugular venous distention. No thyroid enlargement, no tenderness.  LUNGS:Good air entry bilaterally.  Bibasilar Rales, no rhonchi, wheezes.  No use of accessory muscles. CARDIOVASCULAR: S1, S2 normal. No murmurs, rubs, or gallops.  ABDOMEN: Soft, nontender,  nondistended. Bowel sounds present. No organomegaly or mass.  EXTREMITIES: No cyanosis, clubbing, +1-2 pitting edema bilaterally and generalized anasarca. NEUROLOGIC: Cranial nerves II through XII are intact. No focal Motor or sensory deficits b/l.  Globally weak PSYCHIATRIC: The patient is alert and oriented x 3. Good affect.  SKIN: No obvious rash, lesion, or ulcer.    LABORATORY PANEL:   CBC  Recent Labs Lab 10/27/15 0500  WBC 8.6  HGB 7.4*  HCT 23.3*  PLT 379   ------------------------------------------------------------------------------------------------------------------  Chemistries   Recent Labs Lab 10/27/15 0500  NA 138  K 4.0  CL 102  CO2 26  GLUCOSE 275*  BUN 77*  CREATININE 4.29*  CALCIUM 8.9   ------------------------------------------------------------------------------------------------------------------  Cardiac Enzymes No results for input(s): TROPONINI in the last 168 hours. ------------------------------------------------------------------------------------------------------------------  RADIOLOGY:  No results found.   ASSESSMENT AND PLAN:   39 year old female with past medical history of combined diastolic/systolic CHF, CKD stage III, hypertension, diabetes, who originally presented to the hospital due to shortness of breath, swelling and noted to be in acute on chronic respiratory failure and in CHF.  #1 acute on chronic respiratory failure with hypoxia-this is likely secondary to volume overload and CHF. -Continue diuresis with Lasix but patient is slow to respond given her renal failure - to start HD today for fluid removal and will monitor.  -Continue O2 supplementation.  #2 acute on chronic combined systolic/diastolic CHF-clinically patient is still volume overloaded. She is slow to respond to diuresis given her chronic renal failure. -Continue Lasix, Coreg, Imdur, hydralazine. -Appreciate cardiology input -appreicate nephro input and  to start first HD today after temp. Cath dialysis placement.  - follow clinically.   #3  acute on chronic renal failure-this is slow to improve. 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 Part of her renal function is worse from cardiorenal and her ischemic cardiomyopathy.   -She is slow to respond with IV diuresis and continues to be volume overloaded. Appreciate nephrology input and the plan to start her on hemodialysis today as GFR < 20 still.   #4 stridor/raspy voice-this is thought to be related to trauma from her being intubated. She probably has some mild vocal cord swelling as per ENT. -started on IV Solumedrol and improving and will monitor.   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 SSI.   #6 hypertension-continue Coreg, hydralazine, Imdur.  All the records are reviewed and case discussed with Care Management/Social Worker. Management plans discussed with the patient, family and they are in agreement.  CODE STATUS: Full  DVT Prophylaxis: Heparin subcutaneous  TOTAL TIME TAKING CARE OF THIS PATIENT: 30 minutes.   POSSIBLE D/C IN 2-3 DAYS, DEPENDING ON CLINICAL CONDITION.   Houston Siren M.D on 10/27/2015 at 2:37 PM  Between 7am to 6pm - Pager - [phone]  After 6pm go to [email or web address] - password [email or web address] [facility]  [facility]  Office  [phone]  CC: Primary care physician; Sherrie Mustache, MD

## 2015-10-27 NOTE — Progress Notes (Signed)
Subjective:  Renal function remains low.  EGFR remains low at 14.  Temporary dialysis catheter placed this AM.  Will be due for first HD treatment today.  Objective:  Vital signs in last 24 hours:  Temp:  [97.9 F (36.6 C)-98.6 F (37 C)] 98.4 F (36.9 C) (11/19 0800) Pulse Rate:  [78-87] 86 (11/19 0800) Resp:  [16-22] 16 (11/19 0800) BP: (153-159)/(67-78) 153/78 mmHg (11/19 0800) SpO2:  [93 %-100 %] 93 % (11/19 0800) Weight:  [94.167 kg (207 lb 9.6 oz)] 94.167 kg (207 lb 9.6 oz) (11/19 0444)  Weight change: -0.363 kg (-12.8 oz) Filed Weights   10/25/15 0626 10/26/15 0440 10/27/15 0444  Weight: 95.709 kg (211 lb) 94.53 kg (208 lb 6.4 oz) 94.167 kg (207 lb 9.6 oz)    Intake/Output:    Intake/Output Summary (Last 24 hours) at 10/27/15 1208 Last data filed at 10/27/15 0200  Gross per 24 hour  Intake    240 ml  Output    500 ml  Net   -260 ml     Physical Exam: General: No acute distress  HEENT [place]/AT EOMI hearing intact  Neck Supple trachea midline  Pulm/lungs Mild bibasilar rales normal effort  CVS/Heart S1S2 no rubs  Abdomen:  +distended, +abdominal wall edema  Extremities: Tight 2+ b/l LE edema  Neurologic: Alert, oriented x 3, follows comamnds  Skin: Excoriations on b/l LE's noted  Access:  Right femoral dialysis catheter in place       Basic Metabolic Panel:   Recent Labs Lab 10/23/15 0600 10/24/15 0954 10/25/15 0414 10/26/15 0939 10/27/15 0500  NA 138 135 136 135 138  K 3.5 3.7 3.9 4.0 4.0  CL 103 102 102 103 102  CO2 26 24 24 25 26   GLUCOSE 163* 207* 149* 152* 275*  BUN 70* 72* 70* 71* 77*  CREATININE 4.16* 4.64* 4.43* 4.16* 4.29*  CALCIUM 8.6* 8.4* 8.7* 8.5* 8.9     CBC:  Recent Labs Lab 10/25/15 0414 10/25/15 1817 10/27/15 0500  WBC  --   --  8.6  HGB 7.2*  --  7.4*  HCT  --   --  23.3*  MCV  --   --  85.1  PLT  --  346 379      Microbiology:  Recent Results (from the past 720 hour(s))  C difficile quick scan w PCR  reflex     Status: None   Collection Time: 10/10/15 10:21 PM  Result Value Ref Range Status   C Diff antigen NEGATIVE NEGATIVE Final   C Diff toxin NEGATIVE NEGATIVE Final   C Diff interpretation Negative for C. difficile  Final  C difficile quick scan w PCR reflex     Status: None   Collection Time: 10/12/15 10:35 AM  Result Value Ref Range Status   C Diff antigen NEGATIVE NEGATIVE Final   C Diff toxin NEGATIVE NEGATIVE Final   C Diff interpretation Negative for C. difficile  Final  MRSA PCR Screening     Status: None   Collection Time: 10/17/15  9:02 AM  Result Value Ref Range Status   MRSA by PCR NEGATIVE NEGATIVE Final    Comment:        The GeneXpert MRSA Assay (FDA approved for NASAL specimens only), is one component of a comprehensive MRSA colonization surveillance program. It is not intended to diagnose MRSA infection nor to guide or monitor treatment for MRSA infections.   C difficile quick scan w PCR reflex  Status: None   Collection Time: 10/18/15  5:50 AM  Result Value Ref Range Status   C Diff antigen NEGATIVE NEGATIVE Final   C Diff toxin NEGATIVE NEGATIVE Final   C Diff interpretation Negative for C. difficile  Final    Coagulation Studies: No results for input(s): LABPROT, INR in the last 72 hours.  Urinalysis: No results for input(s): COLORURINE, LABSPEC, PHURINE, GLUCOSEU, HGBUR, BILIRUBINUR, KETONESUR, PROTEINUR, UROBILINOGEN, NITRITE, LEUKOCYTESUR in the last 72 hours.  Invalid input(s): APPERANCEUR    Imaging: No results found.   Medications:     . amLODipine  5 mg Oral Daily  . antiseptic oral rinse  7 mL Mouth Rinse BID  . carvedilol  25 mg Oral BID WC  . collagenase   Topical Daily  . fluticasone  2 spray Each Nare Daily  . furosemide  80 mg Oral BID  . heparin subcutaneous  5,000 Units Subcutaneous 3 times per day  . hydrALAZINE  50 mg Oral 3 times per day  . insulin aspart  0-15 Units Subcutaneous TID WC  . insulin aspart  0-5  Units Subcutaneous QHS  . isosorbide mononitrate  60 mg Oral Daily  . methylPREDNISolone (SOLU-MEDROL) injection  40 mg Intravenous Daily  . sodium chloride  10-40 mL Intracatheter Q12H   sodium chloride, acetaminophen **OR** acetaminophen, hydrALAZINE, loperamide, ondansetron **OR** ondansetron (ZOFRAN) IV, sodium chloride, sodium chloride  Assessment/ Plan:  39 y.o. female with medical problems of Diabetes type II (since Age 84, poorly controlled) with severe complications including retinopathy and kidney disease, hypertension, obesity who was admitted to [facility] on 10/10/2015 for evaluation of acute renal failure and anasarca.  1. 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 Acute renal failure from acute cardiorenal syndrome from systolic acute congestive heart failure  Echocardiogram from 11/3 with EF of 40-45%. Also with hypotension during cardiac arrest.  Chronic kidney disease with proteinuria (nephrotic range: 4.9 grams) secondary to diabetic nephropathy.  Baseline creatinine of 1.2 in 08/2014. Unclear how much progression of disease. -EGFR remains low at 14, also remains volume overloaded.  Right femoral dialysis catheter placed today.  Will plan for first HD treatment today.  2. Acute exacerbation of systolic congestive heart failure with anasarca: currently with significant volume overload -  Will start dialysis today and will plan for gentle UF.   3. 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  - continue current insulin regimen.  4. Hypertension: blood pressure 153/78, continue amlodipine, coreg, hydralazine.  Volume removal should help with pressure as well.  5.  Anemia of CKD:  Hgb currently 7.4, will consider adding epogen to her medication regimen soon.   LOS: [place], Davis Ambrosini 11/19/201612:08 PM

## 2015-10-27 NOTE — Progress Notes (Signed)
Pt. Returned from dialysis, trialysis cath to right groin dressing is clean, dry and intact. A&O, no acute distress noted. Will continue to monitor pt.

## 2015-10-27 NOTE — Procedures (Signed)
Temporary HD Catheter Insertion Procedure Note Karen PellegriniCamille Y Rich [id number] Aug 16, 1976  Procedure: Insertion of temporary HD catheter, right femoral vein Indications: ARF, fluid overload  Procedure Details Consent: Obtained from MPOA Time Out: Verified patient identification, verified procedure, site/side was marked, verified correct patient position, special equipment/implants available, medications/allergies/relevent history reviewed, required imaging and test results available.    Maximum sterile technique was used including antiseptics, cap, gloves, gown, hand hygiene, mask and sheet. Skin prep: Chlorhexidine; local anesthetic administered A 30cm Trialysis catheter was placed in the right femoral vein using the Seldinger technique.  Evaluation Blood flow good Complications: No apparent complications Patient did tolerate procedure well.   Esco, Miechia A 10/27/2015, 11:36 AM

## 2015-10-27 NOTE — Progress Notes (Signed)
Speech Therapy Note: attempted to see pt this afternoon, however, she was sleeping. Consulted NSG who reported pt's coughing when swallowing was much improved today; no distress during meals noted by NSG. MD implemented steroid medication, and pt will begin dialysis for fluid volume overload per notes. ST will f/u Monday w/ pt.

## 2015-10-27 NOTE — Progress Notes (Signed)
Patient down in dialysis at this time ,after inserting a HD cath to right femoral , no bleeding noted at insertion site ,  report given to [place] ,

## 2015-10-27 NOTE — Progress Notes (Signed)
[facility]

## 2015-10-28 LAB — GLUCOSE, CAPILLARY
GLUCOSE-CAPILLARY: 200 mg/dL — AB (ref 65–99)
GLUCOSE-CAPILLARY: 280 mg/dL — AB (ref 65–99)
Glucose-Capillary: 317 mg/dL — ABNORMAL HIGH (ref 65–99)
Glucose-Capillary: 269 mg/dL — ABNORMAL HIGH (ref 65–99)

## 2015-10-28 LAB — BASIC METABOLIC PANEL
ANION GAP: 9 (ref 5–15)
BUN: 71 mg/dL — ABNORMAL HIGH (ref 6–20)
CALCIUM: 8.5 mg/dL — AB (ref 8.9–10.3)
CO2: 25 mmol/L (ref 22–32)
CREATININE: 3.93 mg/dL — AB (ref 0.44–1.00)
Chloride: 101 mmol/L (ref 101–111)
GFR calc non Af Amer: 13 mL/min — ABNORMAL LOW (ref >60)
GFR, EST AFRICAN AMERICAN: 15 mL/min — AB (ref >60)
Glucose, Bld: 311 mg/dL — ABNORMAL HIGH (ref 65–99)
Potassium: 3.6 mmol/L (ref 3.5–5.1)
SODIUM: 135 mmol/L (ref 135–145)

## 2015-10-28 LAB — CBC
HCT: 23.2 % — ABNORMAL LOW (ref 35.0–47.0)
Hemoglobin: 7.2 g/dL — ABNORMAL LOW (ref 12.0–16.0)
MCH: 26.7 pg (ref 26.0–34.0)
MCHC: 31.3 g/dL — ABNORMAL LOW (ref 32.0–36.0)
MCV: 85.3 fL (ref 80.0–100.0)
PLATELETS: 354 10*3/uL (ref 150–440)
RBC: 2.72 MIL/uL — ABNORMAL LOW (ref 3.80–5.20)
RDW: 16.4 % — AB (ref 11.5–14.5)
WBC: 14.5 10*3/uL — AB (ref 3.6–11.0)

## 2015-10-28 LAB — PHOSPHORUS: Phosphorus: 3.9 mg/dL (ref 2.5–4.6)

## 2015-10-28 LAB — HEPATITIS C ANTIBODY

## 2015-10-28 LAB — HEPATITIS B SURFACE ANTIBODY, QUANTITATIVE: HEPATITIS B-POST: 29.6 m[IU]/mL

## 2015-10-28 LAB — HEPATITIS B SURFACE ANTIGEN: HEP B S AG: NEGATIVE

## 2015-10-28 MED ORDER — LIDOCAINE HCL (PF) 1 % IJ SOLN
5.0000 mL | INTRAMUSCULAR | Status: DC | PRN
  Filled 2015-10-28: qty 5

## 2015-10-28 MED ORDER — AMLODIPINE BESYLATE 10 MG PO TABS
10.0000 mg | ORAL_TABLET | Freq: Every day | ORAL | Status: DC
  Administered 2015-10-30 – 2015-11-02 (×4): 10 mg via ORAL
  Filled 2015-10-28 (×4): qty 1

## 2015-10-28 MED ORDER — LIDOCAINE-PRILOCAINE 2.5-2.5 % EX CREA
1.0000 "application " | TOPICAL_CREAM | CUTANEOUS | Status: DC | PRN
  Filled 2015-10-28: qty 5

## 2015-10-28 MED ORDER — IPRATROPIUM-ALBUTEROL 0.5-2.5 (3) MG/3ML IN SOLN
3.0000 mL | Freq: Four times a day (QID) | RESPIRATORY_TRACT | Status: DC | PRN
Start: 2015-10-28 — End: 2015-11-02
  Administered 2015-10-29 – 2015-10-30 (×4): 3 mL via RESPIRATORY_TRACT
  Filled 2015-10-28 (×5): qty 3

## 2015-10-28 MED ORDER — ALTEPLASE 2 MG IJ SOLR
2.0000 mg | Freq: Once | INTRAMUSCULAR | Status: DC | PRN
  Filled 2015-10-28: qty 2

## 2015-10-28 MED ORDER — HEPARIN SODIUM (PORCINE) 1000 UNIT/ML DIALYSIS
1000.0000 [IU] | INTRAMUSCULAR | Status: DC | PRN
  Filled 2015-10-28: qty 1

## 2015-10-28 MED ORDER — IPRATROPIUM-ALBUTEROL 0.5-2.5 (3) MG/3ML IN SOLN
3.0000 mL | Freq: Once | RESPIRATORY_TRACT | Status: AC
  Administered 2015-10-28: 3 mL via RESPIRATORY_TRACT
  Filled 2015-10-28: qty 3

## 2015-10-28 MED ORDER — SODIUM CHLORIDE 0.9 % IV SOLN: 100.0000 mL | INTRAVENOUS | Status: DC | PRN

## 2015-10-28 MED ORDER — PENTAFLUOROPROP-TETRAFLUOROETH EX AERO
1.0000 "application " | INHALATION_SPRAY | CUTANEOUS | Status: DC | PRN
  Filled 2015-10-28: qty 30

## 2015-10-28 NOTE — Progress Notes (Signed)
Patient: Karen Rich / Admit Date: 10/10/2015 / Date of Encounter: 10/28/2015, 1:15 PM   Subjective: She continues to be hoarse. She appears to be very deconditioned.  Started dialysis last night and tolerated well.  Review of Systems: Review of Systems  Constitutional: Positive for weight loss and malaise/fatigue. Negative for fever, chills and diaphoresis.  HENT: Positive for sore throat. Negative for congestion.   Eyes: Negative for discharge and redness.  Respiratory: Positive for cough, sputum production and shortness of breath. Negative for hemoptysis and wheezing.   Cardiovascular: Positive for leg swelling. Negative for chest pain, palpitations, orthopnea, claudication and PND.  Gastrointestinal: Negative for nausea and vomiting.  Musculoskeletal: Negative for falls.  Neurological: Positive for weakness. Negative for dizziness, sensory change, speech change, focal weakness and loss of consciousness.  Endo/Heme/Allergies: Does not bruise/bleed easily.  Psychiatric/Behavioral: The patient is not nervous/anxious.      Objective: Telemetry: NSR, 70's Physical Exam: Blood pressure 174/74, pulse 85, temperature 98.6 F (37 C), temperature source Oral, resp. rate 20, height 5' 3.5" (1.613 m), weight 204 lb (92.534 kg), last menstrual period 09/08/2015, SpO2 98 %. Body mass index is 35.57 kg/(m^2). General: Well developed, well nourished, in no acute distress. Head: Normocephalic, atraumatic, sclera non-icteric, no xanthomas, nares are without discharge. Neck: Negative for carotid bruits. JVP 10+. Lungs: Resp regular and mildly labored, diffuse rails, decreased breath sounds at the bases bilaterally. Heart: RRR S1 S2 without murmurs, rubs, or gallops.  Abdomen: Soft, non-tender, non-distended with normoactive bowel sounds. No rebound/guarding. Extremities: No clubbing or cyanosis. 2+ edema. Distal pedal pulses are 2+ and equal bilaterally. Neuro: Alert and oriented X 3. Moves  all extremities spontaneously. Psych:  Responds to questions appropriately with a normal affect.   Intake/Output Summary (Last 24 hours) at 10/28/15 1315 Last data filed at 10/28/15 0830  Gross per 24 hour  Intake    240 ml  Output   1600 ml  Net  -1360 ml    Inpatient Medications:  . amLODipine  5 mg Oral Daily  . antiseptic oral rinse  7 mL Mouth Rinse BID  . carvedilol  25 mg Oral BID WC  . collagenase   Topical Daily  . fluticasone  2 spray Each Nare Daily  . furosemide  80 mg Oral BID  . heparin subcutaneous  5,000 Units Subcutaneous 3 times per day  . hydrALAZINE  50 mg Oral 3 times per day  . insulin aspart  0-15 Units Subcutaneous TID WC  . insulin aspart  0-5 Units Subcutaneous QHS  . isosorbide mononitrate  60 mg Oral Daily  . methylPREDNISolone (SOLU-MEDROL) injection  40 mg Intravenous Daily  . sodium chloride  10-40 mL Intracatheter Q12H   Infusions:    Labs:  Recent Labs  10/27/15 0500 10/28/15 0500  NA 138 135  K 4.0 3.6  CL 102 101  CO2 26 25  GLUCOSE 275* 311*  BUN 77* 71*  CREATININE 4.29* 3.93*  CALCIUM 8.9 8.5*   No results for input(s): AST, ALT, ALKPHOS, BILITOT, PROT, ALBUMIN in the last 72 hours.  Recent Labs  10/25/15 1817 10/27/15 0500  WBC  --  8.6  HGB  --  7.4*  HCT  --  23.3*  MCV  --  85.1  PLT 346 379   No results for input(s): CKTOTAL, CKMB, TROPONINI in the last 72 hours. Invalid input(s): POCBNP No results for input(s): HGBA1C in the last 72 hours.   Weights: [employer]  10/27/15 0444 10/27/15 1710 10/28/15 0600  Weight: 207 lb 9.6 oz (94.167 kg) 208 lb 5.4 oz (94.5 kg) 204 lb (92.534 kg)     Radiology/Studies:  Dg Chest 2 View  10/20/2015  CLINICAL DATA:  Pleural effusion, weakness, shortness of breath and cough. EXAM: CHEST  2 VIEW COMPARISON:  Chest x-ray dated 10/16/2015. FINDINGS: Endotracheal tube has been removed in the interval. Right-sided central line remains with tip well-positioned in the expected  location of the cavoatrial junction. Mild cardiomegaly is unchanged. There is mild central pulmonary vascular congestion but this appears improved compared to the previous study suggesting improved fluid status. There is significantly improved aeration throughout the right lung. Improved aeration also noted at the left lung base. Small residual opacities at each lung base are probably mild atelectasis. Also suspect small residual left pleural effusion. No new lung abnormality. IMPRESSION: Significant interval improvement as detailed above. Electronically Signed   By: Bary RichardStan  Maynard M.D.   On: 10/20/2015 12:29   Dg Chest 2 View  10/10/2015  CLINICAL DATA:  Short of breath and edema EXAM: CHEST  2 VIEW COMPARISON:  07/10/2013 FINDINGS: Lungs are severely under aerated. Small pleural effusions are noted on the lateral view. There is increased opacity towards the lung bases favored represent volume loss. Developing airspace disease is not excluded. Pulmonary vasculature is within normal limits. IMPRESSION: Low lung volumes. Small bilateral pleural effusions Bibasilar volume loss. Electronically Signed   By: Jolaine ClickArthur  Hoss M.D.   On: 10/10/2015 15:46   Ct Head Wo Contrast  10/18/2015  CLINICAL DATA:  Altered mental status.  Lethargy. EXAM: CT HEAD WITHOUT CONTRAST TECHNIQUE: Contiguous axial images were obtained from the base of the skull through the vertex without intravenous contrast. COMPARISON:  None. FINDINGS: Gray-white differentiation is maintained. No CT evidence of acute large territory infarct. No intraparenchymal or extra-axial mass or hemorrhage. Normal size a configuration of the ventricles and basilar cisterns. No midline shift. Limited visualization the paranasal emesis and mastoid air cells is normal. No air-fluid levels. Regional soft tissues appear normal. No displaced calvarial fracture. IMPRESSION: Negative noncontrast head CT. Electronically Signed   By: Simonne ComeJohn  Watts M.D.   On: 10/18/2015 21:17    [place] Renal  10/21/2015  CLINICAL DATA:  39 year old female with acute renal failure. EXAM: RENAL / URINARY TRACT ULTRASOUND COMPLETE COMPARISON:  10/10/2015 and prior ultrasounds FINDINGS: Right Kidney: Length: 11.0 cm. Increased renal echogenicity noted. There is no evidence of hydronephrosis or solid renal mass. Left Kidney: Length: 11.9 cm. Increased renal echogenicity noted. There is no evidence of hydronephrosis or solid renal mass. An 8 mm echogenic focus within the lower left kidney could represent a nonobstructing calculus. Bladder: A Foley catheter is noted within a collapsed bladder. IMPRESSION: Increased renal echogenicity compatible with medical renal disease. No evidence of hydronephrosis. Possible 8 mm nonobstructing left lower pole renal calculus. Foley catheter within the bladder. Electronically Signed   By: Harmon PierJeffrey  Hu M.D.   On: 10/21/2015 14:54   [place] Renal  10/10/2015  CLINICAL DATA:  Acute renal failure. EXAM: RENAL / URINARY TRACT ULTRASOUND COMPLETE COMPARISON:  07/10/2013 chest CT. FINDINGS: Right Kidney: Length: 9.3 cm. Echogenic right kidney with no right hydronephrosis and no right renal mass. No right renal cortical atrophy. Left Kidney: Length: 10.7 cm. Echogenic left kidney with no left hydronephrosis and no left renal mass. No left renal cortical atrophy. Incidentally noted is a small left pleural effusion. Bladder: Appears normal for degree of bladder distention. IMPRESSION: 1. No  hydronephrosis. 2. Echogenic normal size kidneys, indicating nonspecific renal parenchymal disease of uncertain chronicity. 3. Incidental small left pleural effusion. 4. Normal bladder. Electronically Signed   By: Delbert Phenix M.D.   On: 10/10/2015 18:59   Dg Chest Port 1 View  10/16/2015  CLINICAL DATA:  Respiratory failure.  Intubated. EXAM: PORTABLE CHEST 1 VIEW COMPARISON:  10/15/2015 FINDINGS: Endotracheal tube remains near the carina with the tip approximately 14 mm above the carina. There is  cardiomegaly. Moderate layering right pleural effusion. Bilateral vascular congestion and airspace opacities could reflect edema. Mild cardiomegaly. Low lung volumes. IMPRESSION: Endotracheal tube just above the carina approximately 14 mm. Layering right effusion. Bilateral airspace opacities likely reflects edema. Low volumes with cardiomegaly. Electronically Signed   By: Charlett Nose M.D.   On: 10/16/2015 11:55   Dg Chest Port 1 View  10/15/2015  CLINICAL DATA:  Status post intubation. EXAM: PORTABLE CHEST 1 VIEW COMPARISON:  Earlier film, same date. FINDINGS: External pacer paddles are noted. The right IJ center venous catheter tip is in the right atrium and could be retracted 2 cm. The endotracheal tube is right at the carina. Very low lung volumes with vascular crowding atelectasis. Stable cardiac enlargement and pulmonary edema. IMPRESSION: 1. The endotracheal tube is right at the carina and should be retracted approximately 2-3 cm. 2. Right IJ central venous catheter tip is in the right atrium. This could be retracted 2-3 cm. 3. Low lung volumes with vascular crowding, atelectasis and persistent mild interstitial edema. Electronically Signed   By: Rudie Meyer M.D.   On: 10/15/2015 19:26   Dg Chest Port 1 View  10/15/2015  CLINICAL DATA:  Central line placement.  Readjustment. EXAM: PORTABLE CHEST 1 VIEW COMPARISON:  10/15/2015 FINDINGS: Right central line tip is near the cavoatrial junction. Decreasing lung volumes with low lung volumes increasing bilateral lower lobe opacities. Cardiomegaly. No pneumothorax. Mild vascular congestion. IMPRESSION: Retraction of the right central line with the tip now near the cavoatrial junction. Cardiomegaly, vascular congestion. Decreasing lung volumes with bibasilar atelectasis or infiltrates. Electronically Signed   By: Charlett Nose M.D.   On: 10/15/2015 14:46   Dg Chest Port 1 View  10/15/2015  CLINICAL DATA:  Central line placement EXAM: PORTABLE CHEST 1 VIEW  COMPARISON:  10/10/2015 FINDINGS: Enlarged cardiac silhouette. Hazy attenuation in the perihilar region bilaterally suggesting the possibility of pulmonary edema. Right internal jugular central line identified with tip extending about 3 cm into the right atrium. No pneumothorax. IMPRESSION: No pneumothorax.  Central line extends into right atrium. Electronically Signed   By: Esperanza Heir M.D.   On: 10/15/2015 13:51     Assessment and Plan   1. Acute on chronic combined systolic/diastolic chf:  She is still significantly fluid overloaded. Repeat echo on 11/16 showed normal EF, GR2DD, elevated left heart pressure. Would continue dialysis as she has quite a bit of fluid left. His fluid comes off she may be a better responder to Lasix.   2. PEA arrest:  Thought primarily to be a respiratory event. Plan for outpt w/u to r/o ischemia.   3. Essential HTN:  Continue Hydral/nitrate and beta blocker. I have increased her amlodipine to 10 mg daily. Should this not help to control her blood pressure her hydralazine could also be increased.  4. CKD III:  Creat stable. Renal following.  Currently being dialyzed.  5. Disposition: Appears deconditioned. Pinkney Venard likely need placement in a rehabilitation facility. She is a high risk for readmission.  Signed, Loman Brooklyn, MD   10/28/2015, 1:15 PM

## 2015-10-28 NOTE — Progress Notes (Signed)
[facility] -  at [facility]   PATIENT NAME: Karen SailsCamille Hackmann    MR#:  [id number]  DATE OF BIRTH:  [birth year]  SUBJECTIVE:   Feeling a bit more short of breath today. Still has a raspy voice with some stridor. Positive cough which is nonproductive.  REVIEW OF SYSTEMS:    Review of Systems  Constitutional: Negative for fever and chills.  HENT: Negative for congestion and tinnitus.   Eyes: Negative for blurred vision and double vision.  Respiratory: Positive for cough, shortness of breath and stridor. Negative for wheezing.   Cardiovascular: Positive for leg swelling. Negative for chest pain, orthopnea and PND.  Gastrointestinal: Negative for nausea, vomiting, abdominal pain and diarrhea.  Genitourinary: Negative for dysuria and hematuria.  Neurological: Positive for weakness (generalized). Negative for dizziness, sensory change and focal weakness.  All other systems reviewed and are negative.   Nutrition: Renal Tolerating Diet: Yes Tolerating PT: Eval noted.    DRUG ALLERGIES:  No Known Allergies  VITALS:  Blood pressure 174/74, pulse 85, temperature 98.6 F (37 C), temperature source Oral, resp. rate 20, height 5' 3.5" (1.613 m), weight 92.534 kg (204 lb), last menstrual period 09/08/2015, SpO2 98 %.  PHYSICAL EXAMINATION:   Physical Exam  GENERAL:  39 y.o.-year-old patient lying in the bed in mild respiratory distress.   EYES: Pupils equal, round, reactive to light and accommodation. No scleral icterus. Extraocular muscles intact.  HEENT: Head atraumatic, normocephalic. Oropharynx and nasopharynx clear.  NECK:  Supple, + JVD. No thyroid enlargement, no tenderness.  LUNGS:Good air entry bilaterally.  B/l rhonchi, wheezing.  No use of accessory muscles. + Stridor. CARDIOVASCULAR: S1, S2 normal. No murmurs, rubs, or gallops.  ABDOMEN: Soft, nontender, nondistended. Bowel sounds present. No organomegaly or mass.  EXTREMITIES: No cyanosis,  clubbing, +1-2 pitting edema bilaterally and generalized anasarca. NEUROLOGIC: Cranial nerves II through XII are intact. No focal Motor or sensory deficits b/l.  Globally weak PSYCHIATRIC: The patient is alert and oriented x 3. Good affect.  SKIN: No obvious rash, lesion, or ulcer.    LABORATORY PANEL:   CBC  Recent Labs Lab 10/27/15 0500  WBC 8.6  HGB 7.4*  HCT 23.3*  PLT 379   ------------------------------------------------------------------------------------------------------------------  Chemistries   Recent Labs Lab 10/28/15 0500  NA 135  K 3.6  CL 101  CO2 25  GLUCOSE 311*  BUN 71*  CREATININE 3.93*  CALCIUM 8.5*   ------------------------------------------------------------------------------------------------------------------  Cardiac Enzymes No results for input(s): TROPONINI in the last 168 hours. ------------------------------------------------------------------------------------------------------------------  RADIOLOGY:  No results found.   ASSESSMENT AND PLAN:   39 year old female with past medical history of combined diastolic/systolic CHF, CKD stage III, hypertension, diabetes, who originally presented to the hospital due to shortness of breath, swelling and noted to be in acute on chronic respiratory failure and in CHF.  #1 acute on chronic respiratory failure with hypoxia-this is likely secondary to volume overload and CHF and now with some bronchitis too.   -Continue diuresis with Lasix but patient is slow to respond given her renal failure.  Will get HD again today.  - cont. IV steroids, and add duonebs PRN.   -Continue O2 supplementation.  #2 acute on chronic combined systolic/diastolic CHF-clinically patient is still volume overloaded. She is slow to respond to diuresis given her chronic renal failure. -Continue Lasix, Coreg, Imdur, hydralazine. -Appreciate cardiology, nephro input and started on HD yesterday and will get another treatment  today.  - follow clinically.   #3 acute on  chronic renal failure-this is slow to improve. 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 Part of her renal function is worse due to  cardiorenal and her ischemic cardiomyopathy.   -She is slow to respond with IV diuresis and continues to be volume overloaded. Appreciate nephrology input and started on HD yesterday.  Urine output 1 L in the last 24 hrs.   #4 stridor/raspy voice-due to vocal cord swelling from recent intubation.  - cont. IV Solumedrol, duonebs and will monitor.   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 SSI.   #6 hypertension-continue Coreg, hydralazine, Imdur.  All the records are reviewed and case discussed with Care Management/Social Worker. Management plans discussed with the patient, family and they are in agreement.  CODE STATUS: Full  DVT Prophylaxis: Heparin subcutaneous  TOTAL TIME TAKING CARE OF THIS PATIENT: 30 minutes.   POSSIBLE D/C IN 2-3 DAYS, DEPENDING ON CLINICAL CONDITION.   Houston Siren M.D on 10/28/2015 at 12:28 PM  Between 7am to 6pm - Pager - [phone]  After 6pm go to [email or web address] - password [email or web address] [facility]  [facility]  Office  [phone]  CC: Primary care physician; Sherrie Mustache, MD

## 2015-10-28 NOTE — Progress Notes (Signed)
Subjective:  Pt remains short of breath. Still has considerable LE edema. UOP 1.1 liters over the past 24 hours. Had first HD treatment yesterday.   Objective:  Vital signs in last 24 hours:  Temp:  [97.9 F (36.6 C)-98.6 F (37 C)] 98.6 F (37 C) (11/20 1155) Pulse Rate:  [82-86] 85 (11/20 1155) Resp:  [0-22] 20 (11/20 1155) BP: (151-181)/(65-88) 174/74 mmHg (11/20 1155) SpO2:  [98 %-100 %] 98 % (11/20 1155) Weight:  [92.534 kg (204 lb)-94.5 kg (208 lb 5.4 oz)] 92.534 kg (204 lb) (11/20 0600)  Weight change: 0.333 kg (11.8 oz) Filed Weights   10/27/15 0444 10/27/15 1710 10/28/15 0600  Weight: 94.167 kg (207 lb 9.6 oz) 94.5 kg (208 lb 5.4 oz) 92.534 kg (204 lb)    Intake/Output:    Intake/Output Summary (Last 24 hours) at 10/28/15 1420 Last data filed at 10/28/15 1222  Gross per 24 hour  Intake    240 ml  Output   1100 ml  Net   -860 ml     Physical Exam: General: No acute distress  HEENT [place]/AT EOMI hearing intact  Neck Supple trachea midline  Pulm/lungs Mild bibasilar rales normal effort  CVS/Heart S1S2 no rubs  Abdomen:  +distended, +abdominal wall edema  Extremities: Tight 2+ b/l LE edema  Neurologic: Alert, oriented x 3, follows comamnds  Skin: Excoriations on b/l LE's noted  Access:  Right femoral dialysis catheter in place       Basic Metabolic Panel:   Recent Labs Lab 10/24/15 0954 10/25/15 0414 10/26/15 0939 10/27/15 0500 10/28/15 0500  NA 135 136 135 138 135  K 3.7 3.9 4.0 4.0 3.6  CL 102 102 103 102 101  CO2 GLUCOSE 207* 149* 152* 275* 311*  BUN 72* 70* 71* 77* 71*  CREATININE 4.64* 4.43* 4.16* 4.29* 3.93*  CALCIUM 8.4* 8.7* 8.5* 8.9 8.5*     CBC:  Recent Labs Lab 10/25/15 0414 10/25/15 1817 10/27/15 0500  WBC  --   --  8.6  HGB 7.2*  --  7.4*  HCT  --   --  23.3*  MCV  --   --  85.1  PLT  --  346 379      Microbiology:  Recent Results (from the past 720 hour(s))  C difficile quick scan w PCR reflex      Status: None   Collection Time: 10/10/15 10:21 PM  Result Value Ref Range Status   C Diff antigen NEGATIVE NEGATIVE Final   C Diff toxin NEGATIVE NEGATIVE Final   C Diff interpretation Negative for C. difficile  Final  C difficile quick scan w PCR reflex     Status: None   Collection Time: 10/12/15 10:35 AM  Result Value Ref Range Status   C Diff antigen NEGATIVE NEGATIVE Final   C Diff toxin NEGATIVE NEGATIVE Final   C Diff interpretation Negative for C. difficile  Final  MRSA PCR Screening     Status: None   Collection Time: 10/17/15  9:02 AM  Result Value Ref Range Status   MRSA by PCR NEGATIVE NEGATIVE Final    Comment:        The GeneXpert MRSA Assay (FDA approved for NASAL specimens only), is one component of a comprehensive MRSA colonization surveillance program. It is not intended to diagnose MRSA infection nor to guide or monitor treatment for MRSA infections.   C difficile quick scan w PCR reflex     Status:  None   Collection Time: 10/18/15  5:50 AM  Result Value Ref Range Status   C Diff antigen NEGATIVE NEGATIVE Final   C Diff toxin NEGATIVE NEGATIVE Final   C Diff interpretation Negative for C. difficile  Final    Coagulation Studies: No results for input(s): LABPROT, INR in the last 72 hours.  Urinalysis: No results for input(s): COLORURINE, LABSPEC, PHURINE, GLUCOSEU, HGBUR, BILIRUBINUR, KETONESUR, PROTEINUR, UROBILINOGEN, NITRITE, LEUKOCYTESUR in the last 72 hours.  Invalid input(s): APPERANCEUR    Imaging: No results found.   Medications:     . [START ON 10/29/2015] amLODipine  10 mg Oral Daily  . antiseptic oral rinse  7 mL Mouth Rinse BID  . carvedilol  25 mg Oral BID WC  . collagenase   Topical Daily  . fluticasone  2 spray Each Nare Daily  . furosemide  80 mg Oral BID  . heparin subcutaneous  5,000 Units Subcutaneous 3 times per day  . hydrALAZINE  50 mg Oral 3 times per day  . insulin aspart  0-15 Units Subcutaneous TID WC  .  insulin aspart  0-5 Units Subcutaneous QHS  . isosorbide mononitrate  60 mg Oral Daily  . methylPREDNISolone (SOLU-MEDROL) injection  40 mg Intravenous Daily  . sodium chloride  10-40 mL Intracatheter Q12H   sodium chloride, acetaminophen **OR** acetaminophen, hydrALAZINE, ipratropium-albuterol, loperamide, ondansetron **OR** ondansetron (ZOFRAN) IV, sodium chloride, sodium chloride  Assessment/ Plan:  39 y.o. female with medical problems of Diabetes type II (since Age 39, poorly controlled) with severe complications including retinopathy and kidney disease, hypertension, obesity who was [facility]2015 for evaluation of acute renal failure and anasarca.  1. 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 Acute renal failure from acute cardiorenal syndrome from systolic acute congestive heart failure  Echocardiogram from 11/3 with EF of 40-45%. Also with hypotension during cardiac arrest.  Chronic kidney disease with proteinuria (nephrotic range: 4.9 grams) secondary to diabetic nephropathy.  Baseline creatinine of 1.2 in 08/2014. Unclear how much progression of disease. -Will plan for second HD treatment today.  UF target today is 1kg.  2. Acute exacerbation of systolic congestive heart failure with anasarca: currently with significant volume overload -  UF target 1kg today, may continue lasix as well.   3. 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  - management per hospitalist.   4. Hypertension: blood pressure 174/74, hopefully with UF this should improve.  Continue amlodipine/coreg/hydralazine/imdur.  5.  Anemia of CKD: once on an established HD schedule, would consider adding epogen.   LOS: 18 Kassidi Elza 11/20/20162:20 PM

## 2015-10-29 LAB — GLUCOSE, CAPILLARY
GLUCOSE-CAPILLARY: 184 mg/dL — AB (ref 65–99)
GLUCOSE-CAPILLARY: 261 mg/dL — AB (ref 65–99)
GLUCOSE-CAPILLARY: 240 mg/dL — AB (ref 65–99)
Glucose-Capillary: 246 mg/dL — ABNORMAL HIGH (ref 65–99)

## 2015-10-29 LAB — PARATHYROID HORMONE, INTACT (NO CA): PTH: 69 pg/mL — ABNORMAL HIGH (ref 15–65)

## 2015-10-29 MED ORDER — HYDRALAZINE HCL 50 MG PO TABS
100.0000 mg | ORAL_TABLET | Freq: Three times a day (TID) | ORAL | Status: DC
  Administered 2015-10-29 – 2015-10-31 (×6): 100 mg via ORAL
  Filled 2015-10-29 (×6): qty 2

## 2015-10-29 MED ORDER — INSULIN GLARGINE 100 UNIT/ML ~~LOC~~ SOLN
10.0000 [IU] | Freq: Every day | SUBCUTANEOUS | Status: DC
  Administered 2015-10-29 – 2015-10-31 (×3): 10 [IU] via SUBCUTANEOUS
  Filled 2015-10-29 (×4): qty 0.1

## 2015-10-29 MED ORDER — EPOETIN ALFA 10000 UNIT/ML IJ SOLN
10000.0000 [IU] | INTRAMUSCULAR | Status: DC
  Administered 2015-10-29 – 2015-11-02 (×3): 10000 [IU] via INTRAVENOUS

## 2015-10-29 NOTE — Progress Notes (Signed)
Subjective:  Patient underwent dialysis treatment today 1500 cc removed   Objective:  Vital signs in last 24 hours:  Temp:  [97.3 F (36.3 C)-98.7 F (37.1 C)] 98.4 F (36.9 C) (11/21 1333) Pulse Rate:  [81-90] 86 (11/21 1333) Resp:  [16-25] 18 (11/21 1333) BP: (127-189)/(66-106) 189/88 mmHg (11/21 1333) SpO2:  [98 %-100 %] 100 % (11/21 1333) Weight:  [90.7 kg (199 lb 15.3 oz)-92.9 kg (204 lb 12.9 oz)] 90.7 kg (199 lb 15.3 oz) (11/21 1306)  Weight change: -1.6 kg (-3 lb 8.4 oz) Filed Weights   10/29/15 0347 10/29/15 0935 10/29/15 1306  Weight: 91.037 kg (200 lb 11.2 oz) 92.2 kg (203 lb 4.2 oz) 90.7 kg (199 lb 15.3 oz)    Intake/Output:    Intake/Output Summary (Last 24 hours) at 10/29/15 1337 Last data filed at 10/29/15 1306  Gross per 24 hour  Intake    240 ml  Output   2775 ml  Net  -2535 ml    HEMODIALYSIS FLOW SHEET:  Blood Flow Rate (mL/min): 300 mL/min Arterial Pressure (mmHg): -100 mmHg Venous Pressure (mmHg): 150 mmHg Transmembrane Pressure (mmHg): 50 mmHg Ultrafiltration Rate (mL/min): 670 mL/min Dialysate Flow Rate (mL/min): 600 ml/min Conductivity: Machine : 14 Conductivity: Machine : 14 Dialysis Fluid Bolus: Normal Saline Bolus Amount (mL): 250 mL Intra-Hemodialysis Comments: .Marland Kitchentx completed.   Physical Exam: General: No acute distress, sitting up on bed  HEENT Anicteric, moist oral mucous membranes  Neck supple  Pulm/lungs Clear to auscultation, normal respiratory effort  CVS/Heart No rub or gallop  Abdomen:  Soft, nontender  Extremities: 2+ pitting edema, tight  Neurologic: Alert, oriented  Skin: Chronic venostasis changes over lower extremities  Access: Right femoral temporary catheter       Basic Metabolic Panel:   Recent Labs Lab 10/24/15 0954 10/25/15 0414 10/26/15 0939 10/27/15 0500 10/28/15 0500 10/28/15 1515  NA 135 136 135 138 135  --   K 3.7 3.9 4.0 4.0 3.6  --   CL 102 102 103 102 101  --   CO2 --   GLUCOSE 207* 149* 152* 275* 311*  --   BUN 72* 70* 71* 77* 71*  --   CREATININE 4.64* 4.43* 4.16* 4.29* 3.93*  --   CALCIUM 8.4* 8.7* 8.5* 8.9 8.5*  --   PHOS  --   --   --   --   --  3.9     CBC:  Recent Labs Lab 10/25/15 0414 10/25/15 1817 10/27/15 0500 10/28/15 1515  WBC  --   --  8.6 14.5*  HGB 7.2*  --  7.4* 7.2*  HCT  --   --  23.3* 23.2*  MCV  --   --  85.1 85.3  PLT  --  346 379 354      Microbiology:  Recent Results (from the past 720 hour(s))  C difficile quick scan w PCR reflex     Status: None   Collection Time: 10/10/15 10:21 PM  Result Value Ref Range Status   C Diff antigen NEGATIVE NEGATIVE Final   C Diff toxin NEGATIVE NEGATIVE Final   C Diff interpretation Negative for C. difficile  Final  C difficile quick scan w PCR reflex     Status: None   Collection Time: 10/12/15 10:35 AM  Result Value Ref Range Status   C Diff antigen NEGATIVE NEGATIVE Final   C Diff toxin NEGATIVE NEGATIVE Final   C Diff interpretation Negative for  C. difficile  Final  MRSA PCR Screening     Status: None   Collection Time: 10/17/15  9:02 AM  Result Value Ref Range Status   MRSA by PCR NEGATIVE NEGATIVE Final    Comment:        The GeneXpert MRSA Assay (FDA approved for NASAL specimens only), is one component of a comprehensive MRSA colonization surveillance program. It is not intended to diagnose MRSA infection nor to guide or monitor treatment for MRSA infections.   C difficile quick scan w PCR reflex     Status: None   Collection Time: 10/18/15  5:50 AM  Result Value Ref Range Status   C Diff antigen NEGATIVE NEGATIVE Final   C Diff toxin NEGATIVE NEGATIVE Final   C Diff interpretation Negative for C. difficile  Final    Coagulation Studies: No results for input(s): LABPROT, INR in the last 72 hours.  Urinalysis: No results for input(s): COLORURINE, LABSPEC, PHURINE, GLUCOSEU, HGBUR, BILIRUBINUR, KETONESUR, PROTEINUR, UROBILINOGEN, NITRITE,  LEUKOCYTESUR in the last 72 hours.  Invalid input(s): APPERANCEUR    Imaging: No results found.   Medications:     . amLODipine  10 mg Oral Daily  . antiseptic oral rinse  7 mL Mouth Rinse BID  . carvedilol  25 mg Oral BID WC  . collagenase   Topical Daily  . epoetin (EPOGEN/PROCRIT) injection  10,000 Units Intravenous Q M,W,F-HD  . fluticasone  2 spray Each Nare Daily  . furosemide  80 mg Oral BID  . heparin subcutaneous  5,000 Units Subcutaneous 3 times per day  . hydrALAZINE  100 mg Oral 3 times per day  . insulin aspart  0-15 Units Subcutaneous TID WC  . insulin aspart  0-5 Units Subcutaneous QHS  . isosorbide mononitrate  60 mg Oral Daily  . methylPREDNISolone (SOLU-MEDROL) injection  40 mg Intravenous Daily  . sodium chloride  10-40 mL Intracatheter Q12H   sodium chloride, sodium chloride, sodium chloride, acetaminophen **OR** acetaminophen, alteplase, heparin, hydrALAZINE, ipratropium-albuterol, lidocaine (PF), lidocaine-prilocaine, loperamide, ondansetron **OR** ondansetron (ZOFRAN) IV, pentafluoroprop-tetrafluoroeth, sodium chloride, sodium chloride  Assessment/ Plan:  39 y.o. female with medical problems of Diabetes type II (since Age 89, poorly controlled) with severe complications including retinopathy and kidney disease, hypertension, obesity who [facility] for evaluation of acute renal failure and anasarca.  1. 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 Acute renal failure from acute cardiorenal syndrome from systolic acute congestive heart failure Echocardiogram from 11/3 with EF of 40-45%. Also with hypotension during cardiac arrest.  Chronic kidney disease with proteinuria (nephrotic range: 4.9 grams) secondary to diabetic nephropathy. Baseline creatinine of 1.2 in 08/2014. Unclear how much progression of disease.  - patient tolerated hemodialysis well today - It is  starting to seem like she is heading towards ESRD - Will discuss with patient regarding placement of PermCath and outpatient dialysis   2. Acute exacerbation of systolic congestive heart failure with anasarca: currently with significant volume overload - UF target 1.5 kg today, may continue lasix as well.   3. 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  - management per hospitalist.   4. Hypertension: blood pressure noted to be high. Hopefully with UF this should improve. Continue amlodipine/coreg/hydralazine/imdur.  5. Anemia of CKD: once on an established HD schedule, would consider adding epogen.   LOS: 19 Karen Rich 11/21/20161:37 PM

## 2015-10-29 NOTE — Progress Notes (Signed)
This note also relates to the following rows which could not be included: SpO2 - Cannot attach notes to unvalidated device data   Post hd tx 

## 2015-10-29 NOTE — Progress Notes (Signed)
Pre-hd tx 

## 2015-10-29 NOTE — Progress Notes (Signed)
Nutrition Follow-up      INTERVENTION:  Meals and snacks: Recommend adding carb mod to current diet order for better glucose control    NUTRITION DIAGNOSIS:   Inadequate oral intake related to acute illness as evidenced by NPO status.    GOAL:   Patient will meet greater than or equal to 90% of their needs    MONITOR:    (Energy Intake, Anthropometrics, Electrolyte/Renal Profile)  REASON FOR ASSESSMENT:   Diagnosis    ASSESSMENT:      Pt for HD today, planning possible permacath placement per MD note   Current Nutrition: ate 100% of lunch today.  Per I and O sheet eating mostly 100% of meals at times no less than 50% of meals.     Gastrointestinal Profile: Last BM: 11/21   Scheduled Medications:  . amLODipine  10 mg Oral Daily  . antiseptic oral rinse  7 mL Mouth Rinse BID  . carvedilol  25 mg Oral BID WC  . collagenase   Topical Daily  . epoetin (EPOGEN/PROCRIT) injection  10,000 Units Intravenous Q M,W,F-HD  . fluticasone  2 spray Each Nare Daily  . furosemide  80 mg Oral BID  . heparin subcutaneous  5,000 Units Subcutaneous 3 times per day  . hydrALAZINE  100 mg Oral 3 times per day  . insulin aspart  0-15 Units Subcutaneous TID WC  . insulin aspart  0-5 Units Subcutaneous QHS  . insulin glargine  10 Units Subcutaneous QHS  . isosorbide mononitrate  60 mg Oral Daily  . methylPREDNISolone (SOLU-MEDROL) injection  40 mg Intravenous Daily  . sodium chloride  10-40 mL Intracatheter Q12H       Electrolyte/Renal Profile and Glucose Profile:   Recent Labs Lab 10/26/15 0939 10/27/15 0500 10/28/15 0500 10/28/15 1515  NA 135 138 135  --   K 4.0 4.0 3.6  --   CL 103 102 101  --   CO2 25 26 25   --   BUN 71* 77* 71*  --   CREATININE 4.16* 4.29* 3.93*  --   CALCIUM 8.5* 8.9 8.5*  --   PHOS  --   --   --  3.9  GLUCOSE 152* 275* 311*  --         Weight Trend since Admission: Filed Weights   10/29/15 0347 10/29/15 0935 10/29/15 1306   Weight: 200 lb 11.2 oz (91.037 kg) 203 lb 4.2 oz (92.2 kg) 199 lb 15.3 oz (90.7 kg)      Diet Order:  Diet renal with fluid restriction Fluid restriction:: 1200 mL Fluid; Room service appropriate?: Yes; Fluid consistency:: Thin  Skin:   reviewed   Height:   Ht Readings from Last 1 Encounters:  10/10/15 5' 3.5" (1.613 m)    Weight:   Wt Readings from Last 1 Encounters:  10/29/15 199 lb 15.3 oz (90.7 kg)    Ideal Body Weight:     BMI:  Body mass index is 34.86 kg/(m^2).  Estimated Nutritional Needs:   Kcal:  Using IBW of 55kg (1184 kcals IF 1.0-1.3, AF 1.3) 1539-2000 kcals/d  Protein:  (1.2-1.5 g/kg) 66-83 g/d  Fluid:  (25-4330ml/kg) 1375-163050ml/d  EDUCATION NEEDS:   Education needs addressed  LOW Care Level  Damario Gillie B. Freida BusmanAllen, RD, LDN [phone] (pager)

## 2015-10-29 NOTE — Progress Notes (Signed)
Post hd tx 

## 2015-10-29 NOTE — Progress Notes (Signed)
PT Cancellation Note  Patient Details Name: Karen Rich MRN: [id number] DOB: Jul 12, 1976   Cancelled Treatment:    Reason Eval/Treat Not Completed: Patient at procedure or test/unavailable. Patient is currently off the floor for dialysis, PT will continue to attempt mobility sessions with patient as she is appropriate and available.   Kerin RansomPatrick A Lynnex Fulp, PT, DPT    10/29/2015, 11:17 AM

## 2015-10-29 NOTE — Progress Notes (Signed)
Patient alert and oriented, very hoarse.  No complaints of pain.   Some wheezing noted, given breathing tx x1.  Patient impulsive at times.  Using bedside commode with 1 assist.  High Fall risk.  Bed alarm on, lowest positiion, non skid socks in place.  Patient educated multiple times about using call bell or calling nurse/aide with ascom.

## 2015-10-29 NOTE — Progress Notes (Signed)
HD tx start 

## 2015-10-29 NOTE — Progress Notes (Signed)
HD tx completed.

## 2015-10-29 NOTE — Care Management (Signed)
Patient had temporary dialysis cath placed in right groin 11/19 despite CM note and communication that placement in the groin would prevent physical therapy to continue therapy in this very debilitated patient.   Reported that the primary nurse did address this with vascular prior to procedure.  It is not documented that there was  contraindication to placing the cath elsewhere on the body.

## 2015-10-29 NOTE — Progress Notes (Signed)
Patient: Karen PellegriniCamille Y Letizia / Admit Date: 10/10/2015 / Date of Encounter: 10/29/2015, 8:42 AM   Subjective: Up sitting on the edge of the bed. Less SOB. BP remains elevated in the 160's to 170's systolic. Started dialysis on 11/19.   Review of Systems: Review of Systems  Constitutional: Positive for weight loss and malaise/fatigue. Negative for fever, chills and diaphoresis.  HENT: Negative for congestion.   Eyes: Negative for discharge and redness.  Respiratory: Positive for cough, shortness of breath and wheezing. Negative for hemoptysis and sputum production.   Cardiovascular: Positive for leg swelling. Negative for chest pain, palpitations, orthopnea, claudication and PND.  Gastrointestinal: Negative for nausea and vomiting.  Musculoskeletal: Negative for falls.  Skin: Negative for rash.  Neurological: Positive for weakness. Negative for sensory change, speech change, focal weakness and loss of consciousness.  Endo/Heme/Allergies: Does not bruise/bleed easily.  Psychiatric/Behavioral: The patient is not nervous/anxious.     Objective: Telemetry: NSR, 70's Physical Exam: Blood pressure 171/83, pulse 88, temperature 98.7 F (37.1 C), temperature source Oral, resp. rate 24, height 5' 3.5" (1.613 m), weight 200 lb 11.2 oz (91.037 kg), last menstrual period 09/08/2015, SpO2 99 %. Body mass index is 34.99 kg/(m^2). General: Well developed, well nourished, in no acute distress. Head: Normocephalic, atraumatic, sclera non-icteric, no xanthomas, nares are without discharge. Neck: Negative for carotid bruits. JVP not elevated. Lungs: Resp regular and mildly labored, diffuse rails, decreased breath sounds at the bases bilaterally. Heart: RRR S1 S2 without murmurs, rubs, or gallops.  Abdomen: Soft, non-tender, non-distended with normoactive bowel sounds. No rebound/guarding. Extremities: No clubbing or cyanosis. 2+ edema. Distal pedal pulses are 2+ and equal bilaterally. Neuro: Alert and  oriented X 3. Moves all extremities spontaneously. Psych:  Responds to questions appropriately with a normal affect.   Intake/Output Summary (Last 24 hours) at 10/29/15 0842 Last data filed at 10/29/15 0415  Gross per 24 hour  Intake    480 ml  Output   1275 ml  Net   -795 ml    Inpatient Medications:  . amLODipine  10 mg Oral Daily  . antiseptic oral rinse  7 mL Mouth Rinse BID  . carvedilol  25 mg Oral BID WC  . collagenase   Topical Daily  . epoetin (EPOGEN/PROCRIT) injection  10,000 Units Intravenous Q M,W,F-HD  . fluticasone  2 spray Each Nare Daily  . furosemide  80 mg Oral BID  . heparin subcutaneous  5,000 Units Subcutaneous 3 times per day  . hydrALAZINE  50 mg Oral 3 times per day  . insulin aspart  0-15 Units Subcutaneous TID WC  . insulin aspart  0-5 Units Subcutaneous QHS  . isosorbide mononitrate  60 mg Oral Daily  . methylPREDNISolone (SOLU-MEDROL) injection  40 mg Intravenous Daily  . sodium chloride  10-40 mL Intracatheter Q12H   Infusions:    Labs:  Recent Labs  10/27/15 0500 10/28/15 0500 10/28/15 1515  NA 138 135  --   K 4.0 3.6  --   CL 102 101  --   CO2 26 25  --   GLUCOSE 275* 311*  --   BUN 77* 71*  --   CREATININE 4.29* 3.93*  --   CALCIUM 8.9 8.5*  --   PHOS  --   --  3.9   No results for input(s): AST, ALT, ALKPHOS, BILITOT, PROT, ALBUMIN in the last 72 hours.  Recent Labs  10/27/15 0500 10/28/15 1515  WBC 8.6 14.5*  HGB 7.4* 7.2*  HCT 23.3* 23.2*  MCV 85.1 85.3  PLT 379 354   No results for input(s): CKTOTAL, CKMB, TROPONINI in the last 72 hours. Invalid input(s): POCBNP No results for input(s): HGBA1C in the last 72 hours.   Weights: Filed Weights   10/28/15 1515 10/28/15 1817 10/29/15 0347  Weight: 204 lb 12.9 oz (92.9 kg) 203 lb 4.8 oz (92.216 kg) 200 lb 11.2 oz (91.037 kg)     Radiology/Studies:  Dg Chest 2 View  10/20/2015  CLINICAL DATA:  Pleural effusion, weakness, shortness of breath and cough. EXAM: CHEST  2  VIEW COMPARISON:  Chest x-ray dated 10/16/2015. FINDINGS: Endotracheal tube has been removed in the interval. Right-sided central line remains with tip well-positioned in the expected location of the cavoatrial junction. Mild cardiomegaly is unchanged. There is mild central pulmonary vascular congestion but this appears improved compared to the previous study suggesting improved fluid status. There is significantly improved aeration throughout the right lung. Improved aeration also noted at the left lung base. Small residual opacities at each lung base are probably mild atelectasis. Also suspect small residual left pleural effusion. No new lung abnormality. IMPRESSION: Significant interval improvement as detailed above. Electronically Signed   By: Bary Richard M.D.   On: 10/20/2015 12:29   Dg Chest 2 View  10/10/2015  CLINICAL DATA:  Short of breath and edema EXAM: CHEST  2 VIEW COMPARISON:  07/10/2013 FINDINGS: Lungs are severely under aerated. Small pleural effusions are noted on the lateral view. There is increased opacity towards the lung bases favored represent volume loss. Developing airspace disease is not excluded. Pulmonary vasculature is within normal limits. IMPRESSION: Low lung volumes. Small bilateral pleural effusions Bibasilar volume loss. Electronically Signed   By: Jolaine Click M.D.   On: 10/10/2015 15:46   Ct Head Wo Contrast  10/18/2015  CLINICAL DATA:  Altered mental status.  Lethargy. EXAM: CT HEAD WITHOUT CONTRAST TECHNIQUE: Contiguous axial images were obtained from the base of the skull through the vertex without intravenous contrast. COMPARISON:  None. FINDINGS: Gray-white differentiation is maintained. No CT evidence of acute large territory infarct. No intraparenchymal or extra-axial mass or hemorrhage. Normal size a configuration of the ventricles and basilar cisterns. No midline shift. Limited visualization the paranasal emesis and mastoid air cells is normal. No air-fluid  levels. Regional soft tissues appear normal. No displaced calvarial fracture. IMPRESSION: Negative noncontrast head CT. Electronically Signed   By: Simonne Come M.D.   On: 10/18/2015 21:17   US Renal  10/21/2015  CLINICAL DATA:  39 year old female with acute renal failure. EXAM: RENAL / URINARY TRACT ULTRASOUND COMPLETE COMPARISON:  10/10/2015 and prior ultrasounds FINDINGS: Right Kidney: Length: 11.0 cm. Increased renal echogenicity noted. There is no evidence of hydronephrosis or solid renal mass. Left Kidney: Length: 11.9 cm. Increased renal echogenicity noted. There is no evidence of hydronephrosis or solid renal mass. An 8 mm echogenic focus within the lower left kidney could represent a nonobstructing calculus. Bladder: A Foley catheter is noted within a collapsed bladder. IMPRESSION: Increased renal echogenicity compatible with medical renal disease. No evidence of hydronephrosis. Possible 8 mm nonobstructing left lower pole renal calculus. Foley catheter within the bladder. Electronically Signed   By: Harmon Pier M.D.   On: 10/21/2015 14:54   US Renal  10/10/2015  CLINICAL DATA:  Acute renal failure. EXAM: RENAL / URINARY TRACT ULTRASOUND COMPLETE COMPARISON:  07/10/2013 chest CT. FINDINGS: Right Kidney: Length: 9.3 cm. Echogenic right kidney with no right hydronephrosis and no right renal  mass. No right renal cortical atrophy. Left Kidney: Length: 10.7 cm. Echogenic left kidney with no left hydronephrosis and no left renal mass. No left renal cortical atrophy. Incidentally noted is a small left pleural effusion. Bladder: Appears normal for degree of bladder distention. IMPRESSION: 1. No hydronephrosis. 2. Echogenic normal size kidneys, indicating nonspecific renal parenchymal disease of uncertain chronicity. 3. Incidental small left pleural effusion. 4. Normal bladder. Electronically Signed   By: Delbert Phenix M.D.   On: 10/10/2015 18:59   Dg Chest Port 1 View  10/16/2015  CLINICAL DATA:   Respiratory failure.  Intubated. EXAM: PORTABLE CHEST 1 VIEW COMPARISON:  10/15/2015 FINDINGS: Endotracheal tube remains near the carina with the tip approximately 14 mm above the carina. There is cardiomegaly. Moderate layering right pleural effusion. Bilateral vascular congestion and airspace opacities could reflect edema. Mild cardiomegaly. Low lung volumes. IMPRESSION: Endotracheal tube just above the carina approximately 14 mm. Layering right effusion. Bilateral airspace opacities likely reflects edema. Low volumes with cardiomegaly. Electronically Signed   By: Charlett Nose M.D.   On: 10/16/2015 11:55   Dg Chest Port 1 View  10/15/2015  CLINICAL DATA:  Status post intubation. EXAM: PORTABLE CHEST 1 VIEW COMPARISON:  Earlier film, same date. FINDINGS: External pacer paddles are noted. The right IJ center venous catheter tip is in the right atrium and could be retracted 2 cm. The endotracheal tube is right at the carina. Very low lung volumes with vascular crowding atelectasis. Stable cardiac enlargement and pulmonary edema. IMPRESSION: 1. The endotracheal tube is right at the carina and should be retracted approximately 2-3 cm. 2. Right IJ central venous catheter tip is in the right atrium. This could be retracted 2-3 cm. 3. Low lung volumes with vascular crowding, atelectasis and persistent mild interstitial edema. Electronically Signed   By: Rudie Meyer M.D.   On: 10/15/2015 19:26   Dg Chest Port 1 View  10/15/2015  CLINICAL DATA:  Central line placement.  Readjustment. EXAM: PORTABLE CHEST 1 VIEW COMPARISON:  10/15/2015 FINDINGS: Right central line tip is near the cavoatrial junction. Decreasing lung volumes with low lung volumes increasing bilateral lower lobe opacities. Cardiomegaly. No pneumothorax. Mild vascular congestion. IMPRESSION: Retraction of the right central line with the tip now near the cavoatrial junction. Cardiomegaly, vascular congestion. Decreasing lung volumes with bibasilar  atelectasis or infiltrates. Electronically Signed   By: Charlett Nose M.D.   On: 10/15/2015 14:46   Dg Chest Port 1 View  10/15/2015  CLINICAL DATA:  Central line placement EXAM: PORTABLE CHEST 1 VIEW COMPARISON:  10/10/2015 FINDINGS: Enlarged cardiac silhouette. Hazy attenuation in the perihilar region bilaterally suggesting the possibility of pulmonary edema. Right internal jugular central line identified with tip extending about 3 cm into the right atrium. No pneumothorax. IMPRESSION: No pneumothorax.  Central line extends into right atrium. Electronically Signed   By: Esperanza Heir M.D.   On: 10/15/2015 13:51     Assessment and Plan   1. Acute on chronic combined systolic/diastolic chf:  She is still significantly fluid overloaded. Repeat echo on 11/16 showed normal EF, GR2DD, elevated left heart pressure. Would continue dialysis as she has quite a bit of fluid left. His fluid comes off she may be a better responder to Lasix.   2. PEA arrest:  Thought primarily to be a respiratory event. Plan for outpt w/u to r/o ischemia.   3. Essential HTN:  Continue Hydral/nitrate and beta blocker. Continue amlodipine to 10 mg daily. Increase her hydralazine given BP remains  in the 160's to 170's systolic.  4. CKD III:  Creat stable. Renal following.  Currently being dialyzed.  5. Disposition: Appears deconditioned. Will likely need placement in a rehabilitation facility. She is a high risk for readmission.    Elinor Dodge, PA-C Pager: [phone] 10/29/2015, 8:42 AM

## 2015-10-29 NOTE — Progress Notes (Signed)
[facility] [place] at [facility]   PATIENT NAME: Karen SailsCamille Rich    MR#:  [id number]  DATE OF BIRTH:  [birth year]  SUBJECTIVE:   Feeling a bit more short of breath today. Still has a raspy voice with some stridor. Positive cough which is nonproductive.  REVIEW OF SYSTEMS:    Review of Systems  Constitutional: Negative for fever and chills.  HENT: Negative for congestion and tinnitus.   Eyes: Negative for blurred vision and double vision.  Respiratory: Positive for cough, shortness of breath and stridor. Negative for wheezing.   Cardiovascular: Positive for leg swelling. Negative for chest pain, orthopnea and PND.  Gastrointestinal: Negative for nausea, vomiting, abdominal pain and diarrhea.  Genitourinary: Negative for dysuria and hematuria.  Neurological: Positive for weakness (generalized). Negative for dizziness, sensory change and focal weakness.  All other systems reviewed and are negative.   Nutrition: Renal Tolerating Diet: Yes Tolerating PT: Eval noted.    DRUG ALLERGIES:  No Known Allergies  VITALS:  Blood pressure 187/83, pulse 85, temperature 98.4 F (36.9 C), temperature source Oral, resp. rate 18, height 5' 3.5" (1.613 m), weight 90.7 kg (199 lb 15.3 oz), last menstrual period 09/08/2015, SpO2 100 %.  PHYSICAL EXAMINATION:   Physical Exam  GENERAL:  39 y.o.-year-old patient lying in the bed in mild respiratory distress.   EYES: Pupils equal, round, reactive to light and accommodation. No scleral icterus. Extraocular muscles intact.  HEENT: Head atraumatic, normocephalic. Oropharynx and nasopharynx clear.  NECK:  Supple, + JVD. No thyroid enlargement, no tenderness.  LUNGS:Good air entry bilaterally.  B/l rhonchi, wheezing.  No use of accessory muscles. + Stridor. CARDIOVASCULAR: S1, S2 normal. No murmurs, rubs, or gallops.  ABDOMEN: Soft, nontender, nondistended. Bowel sounds present. No organomegaly or mass.  EXTREMITIES: No  cyanosis, clubbing, +1-2 pitting edema bilaterally and generalized anasarca. NEUROLOGIC: Cranial nerves II through XII are intact. No focal Motor or sensory deficits b/l.  Globally weak PSYCHIATRIC: The patient is alert and oriented x 3. Good affect.  SKIN: No obvious rash, lesion, or ulcer.    LABORATORY PANEL:   CBC  Recent Labs Lab 10/28/15 1515  WBC 14.5*  HGB 7.2*  HCT 23.2*  PLT 354   ------------------------------------------------------------------------------------------------------------------  Chemistries   Recent Labs Lab 10/28/15 0500  NA 135  K 3.6  CL 101  CO2 25  GLUCOSE 311*  BUN 71*  CREATININE 3.93*  CALCIUM 8.5*   ------------------------------------------------------------------------------------------------------------------  Cardiac Enzymes No results for input(s): TROPONINI in the last 168 hours. ------------------------------------------------------------------------------------------------------------------  RADIOLOGY:  No results found.   ASSESSMENT AND PLAN:   39 year old female with past medical history of combined diastolic/systolic CHF, CKD stage III, hypertension, diabetes, who originally presented to the hospital due to shortness of breath, swelling and noted to be in acute on chronic respiratory failure and in CHF.  #1 acute on chronic respiratory failure with hypoxia-this is likely secondary to volume overload and CHF and now with some bronchitis too.   - cont. HD, IV steroids and improving but slowly  - cont. Duonebs.  -Continue O2 supplementation and wean as tolerated.   #2 acute on chronic combined systolic/diastolic CHF-clinically patient is still volume overloaded. She was slow to respond to diuresis given her chronic renal failure. -Continue Lasix, Coreg, Imdur, hydralazine. -Appreciate cardiology, nephro input and cont. HD as tolerated per Nephrology.  #3 acute on chronic renal failure-this is slow to  improve. 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 Part of her renal function  is worse due to  cardiorenal and her ischemic cardiomyopathy.   -She was slow to respond with IV diuresis and started on HD as per Nephrology and cont. Care as per them. Urine output still poor.   #4 stridor/raspy voice-due to vocal cord swelling from recent intubation.  - cont. IV Solumedrol, duonebs and improving.  - seen by speech and no aspiration risk presently.   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   - cont. SSI.   #6 hypertension-continue Coreg, hydralazine, Imdur.  All the records are reviewed and case discussed with Care Management/Social Worker. Management plans discussed with the patient, family and they are in agreement.  CODE STATUS: Full  DVT Prophylaxis: Heparin subcutaneous  TOTAL TIME TAKING CARE OF THIS PATIENT: 30 minutes.   POSSIBLE D/C IN 2-3 DAYS, DEPENDING ON CLINICAL CONDITION.   Houston Siren M.D on 10/29/2015 at 2:20 PM  Between 7am to 6pm - Pager - [phone]  After 6pm go to [email or web address] - password [email or web address] [facility]  [place]  Office  [phone]  CC: Primary care physician; Sherrie Mustache, MD

## 2015-10-30 DIAGNOSIS — I10 Essential (primary) hypertension: Secondary | ICD-10-CM

## 2015-10-30 DIAGNOSIS — I5033 Acute on chronic diastolic (congestive) heart failure: Secondary | ICD-10-CM | POA: Insufficient documentation

## 2015-10-30 LAB — TRANSFERRIN: TRANSFERRIN: 193 mg/dL (ref 192–382)

## 2015-10-30 LAB — CBC
HCT: 22.5 % — ABNORMAL LOW (ref 35.0–47.0)
HEMOGLOBIN: 7.1 g/dL — AB (ref 12.0–16.0)
MCH: 27.1 pg (ref 26.0–34.0)
MCHC: 31.8 g/dL — AB (ref 32.0–36.0)
MCV: 85.1 fL (ref 80.0–100.0)
PLATELETS: 299 10*3/uL (ref 150–440)
RBC: 2.64 MIL/uL — AB (ref 3.80–5.20)
RDW: 16.1 % — ABNORMAL HIGH (ref 11.5–14.5)
WBC: 15.2 10*3/uL — ABNORMAL HIGH (ref 3.6–11.0)

## 2015-10-30 LAB — FERRITIN: Ferritin: 90 ng/mL (ref 11–307)

## 2015-10-30 LAB — GLUCOSE, CAPILLARY
Glucose-Capillary: 185 mg/dL — ABNORMAL HIGH (ref 65–99)
Glucose-Capillary: 176 mg/dL — ABNORMAL HIGH (ref 65–99)
Glucose-Capillary: 328 mg/dL — ABNORMAL HIGH (ref 65–99)
Glucose-Capillary: 284 mg/dL — ABNORMAL HIGH (ref 65–99)

## 2015-10-30 LAB — IRON AND TIBC
IRON: 19 ug/dL — AB (ref 28–170)
SATURATION RATIOS: 7 % — AB (ref 10.4–31.8)
TIBC: 258 ug/dL (ref 250–450)
UIBC: 239 ug/dL

## 2015-10-30 MED ORDER — ISOSORBIDE MONONITRATE ER 30 MG PO TB24: 30.0000 mg | ORAL_TABLET | Freq: Every day | ORAL | Status: DC

## 2015-10-30 NOTE — Progress Notes (Signed)
Consent for perm cath signed and placed in chart.

## 2015-10-30 NOTE — Progress Notes (Signed)
PT Cancellation Note  Patient Details Name: Karen Rich MRN: [id number] DOB: 12-16-1975   Cancelled Treatment:    Reason Eval/Treat Not Completed: Patient at procedure or test/unavailable.  Pt off floor at dialysis; pt's Hgb also noted to be 7.1 today.  Will re-attempt PT treatment session at a later date/time.   Irving Burtonmily Wilber Fini 10/30/2015, 9:55 AM Hendricks LimesEmily Akia Montalban, PT [phone]

## 2015-10-30 NOTE — Evaluation (Signed)
Occupational Therapy Evaluation Patient Details Name: Karen Rich MRN: [id number] DOB: 11/19/1976 Today's Date: 10/30/2015    History of Present Illness This patient is a 39 year old female who came to [facility] for acute swelling of the body and shortness of breath secondary to acute CHF. On 10/15/15 pt went into cardiac arrest where she was intubated and sedated. Today she is monitored on telemetry.    Clinical Impression   This patient is a 39 year old female who came to [facility] with the above history. Also with hyperlipidemia, irregular heart beat, HTN and DM. She lives with her mother, sister and 16 year old daughter in a one level home with 2 steps to enter. Her sister works. She started getting assist with lower body dressing secondary to shortness of breath. She would benefit from Occupational Therapy for energy saving techniques.  Nothing physical done during this evaluation secondary to Femoral Cath.    Follow Up Recommendations       Equipment Recommendations       Recommendations for Other Services       Precautions / Restrictions        Mobility Bed Mobility                  Transfers                      Balance                                            ADL                                         General ADL Comments: Patient had been indpendent with basic ADL but recently needs assist for lower body dressing secondary to shortness of breath. She continues to be shortness of breath per patient. Instructed patient in hip kit use to dress and bathe lower body to prevent shortness of breath. Given list of vendors. Taught energy saving techniques for activities of daily living.      Vision     Perception     Praxis      Pertinent Vitals/Pain       Hand Dominance     Extremity/Trunk Assessment Upper Extremity Assessment Upper Extremity Assessment:  Defer to OT evaluation   Lower Extremity Assessment Lower Extremity Assessment: Defer to PT evaluation       Communication Communication Communication:  (Patient is hoarse.)   Cognition Arousal/Alertness: Awake/alert (A little sleepy) Behavior During Therapy: WFL for tasks assessed/performed Overall Cognitive Status: Within Functional Limits for tasks assessed                     General Comments       Exercises       Shoulder Instructions      Home Living Family/patient expects to be discharged to:: Private residence Living Arrangements: Parent (sister who works, 12 year old daughter) Available Help at Discharge: Family;Available 24 hours/day Type of Home: House Home Access: Stairs to enter [employer] of Steps: 2 Entrance Stairs-Rails: None Home Layout: One level               Home Equipment: None  Prior Functioning/Environment Level of Independence: Independent        Comments: Recently mom has help with lower body dressing secondary to shortness of breath.    OT Diagnosis: Generalized weakness   OT Problem List: Decreased strength;Decreased activity tolerance;Impaired balance (sitting and/or standing);Decreased knowledge of use of DME or AE   OT Treatment/Interventions: Self-care/ADL training    OT Goals(Current goals can be found in the care plan section) Acute Rehab OT Goals Patient Stated Goal: Just to go home OT Goal Formulation: With patient Time For Goal Achievement: 10/30/15 Potential to Achieve Goals: Good  OT Frequency: Min 1X/week   Barriers to D/C:            Co-evaluation              End of Session Equipment Utilized During Treatment:  (Hip kit)  Activity Tolerance:   Patient left:     Time: 1450-1510 OT Time Calculation (min): 20 min Charges:  OT General Charges $OT Visit: 1 Procedure OT Evaluation $Initial OT Evaluation Tier I: 1 Procedure OT Treatments $Self Care/Home Management :  8-22 mins G-Codes:    Huff, John M John M Huff, MS/OTR/L  10/30/2015, 3:35 PM    

## 2015-10-30 NOTE — Progress Notes (Addendum)
Patient: Karen Rich / Admit Date: 10/10/2015 / Date of Encounter: 10/30/2015, 8:41 AM   Subjective: Swollen ABD, SOB, Leg still very tight Mild cough, worse when supine  Review of Systems: Review of Systems  Respiratory: Positive for shortness of breath.   Cardiovascular: Positive for orthopnea and leg swelling.  Gastrointestinal: Negative.   Musculoskeletal: Negative.   Neurological: Positive for weakness.  Psychiatric/Behavioral: Negative.   All other systems reviewed and are negative.   Objective: Telemetry: NSR Physical Exam: Blood pressure 171/82, pulse 81, temperature 97.8 F (36.6 C), temperature source Oral, resp. rate 16, height 5' 3.5" (1.613 m), weight 195 lb 15.8 oz (88.9 kg), last menstrual period 09/08/2015, SpO2 97 %. Body mass index is 34.17 kg/(m^2). General: Well developed, well nourished, in no acute distress. Head: Normocephalic, atraumatic, sclera non-icteric, no xanthomas, nares are without discharge. Neck: Negative for carotid bruits. JVP elevated 10+ Lungs: Clear bilaterally to auscultation without wheezes, rales, or rhonchi. Breathing is mildly labored, dull at bases. Heart: RRR S1 S2 without murmurs, rubs, or gallops.  Abdomen: Soft, non-tender, distended with normoactive bowel sounds. No rebound/guarding. Extremities: No clubbing or cyanosis. 2+ edema to thighs.  Neuro: Alert and oriented X 3. Moves all extremities spontaneously. Psych:  Responds to questions appropriately with a normal affect.   Intake/Output Summary (Last 24 hours) at 10/30/15 0841 Last data filed at 10/30/15 0816  Gross per 24 hour  Intake    270 ml  Output   1850 ml  Net  -1580 ml    Inpatient Medications:  . amLODipine  10 mg Oral Daily  . antiseptic oral rinse  7 mL Mouth Rinse BID  . carvedilol  25 mg Oral BID WC  . collagenase   Topical Daily  . epoetin (EPOGEN/PROCRIT) injection  10,000 Units Intravenous Q M,W,F-HD  . fluticasone  2 spray Each Nare Daily  .  furosemide  80 mg Oral BID  . heparin subcutaneous  5,000 Units Subcutaneous 3 times per day  . hydrALAZINE  100 mg Oral 3 times per day  . insulin aspart  0-15 Units Subcutaneous TID WC  . insulin aspart  0-5 Units Subcutaneous QHS  . insulin glargine  10 Units Subcutaneous QHS  . isosorbide mononitrate  60 mg Oral Daily  . methylPREDNISolone (SOLU-MEDROL) injection  40 mg Intravenous Daily  . sodium chloride  10-40 mL Intracatheter Q12H   Infusions:    Labs:  Recent Labs  10/28/15 0500 10/28/15 1515  NA 135  --   K 3.6  --   CL 101  --   CO2 25  --   GLUCOSE 311*  --   BUN 71*  --   CREATININE 3.93*  --   CALCIUM 8.5*  --   PHOS  --  3.9   No results for input(s): AST, ALT, ALKPHOS, BILITOT, PROT, ALBUMIN in the last 72 hours.  Recent Labs  10/28/15 1515 10/30/15 0443  WBC 14.5* 15.2*  HGB 7.2* 7.1*  HCT 23.2* 22.5*  MCV 85.3 85.1  PLT 354 299   No results for input(s): CKTOTAL, CKMB, TROPONINI in the last 72 hours. Invalid input(s): POCBNP No results for input(s): HGBA1C in the last 72 hours.   Weights: Filed Weights   10/29/15 0935 10/29/15 1306 10/30/15 0534  Weight: 203 lb 4.2 oz (92.2 kg) 199 lb 15.3 oz (90.7 kg) 195 lb 15.8 oz (88.9 kg)     Radiology/Studies:  Dg Chest 2 View  10/20/2015  CLINICAL DATA:  IMPRESSION: Significant interval improvement as detailed above. Electronically Signed   By: Bary RichardStan  Maynard M.D.   On: 10/20/2015 12:29      Assessment and Plan  39 y.o. female   1. Acute on chronic combined systolic/diastolic chf:  still significantly fluid overloaded.  ----Would continue dialysis, still with significant ABD, leg edema, pleural effusions on exam Patient feels she still has "30 pounds to go"  2. Anemia: HCT now 22, Likely needs EPO, And given continued decline, may benefit from PRBC x 2 Better blood count will assist with fluid/SOB/leg edema/pleural effusions  3. Essential HTN:  Continue amlodipine to 10 mg daily,  hydralazine 100 Q8,  Coreg BID BP should improve with HD imdur 60 mg daily added, will monitor  4. CKD III:  ESRD now on HD, 3 rounds of HD so far  Renal following.   5. Disposition:   deconditioned. Will likely need placement in a rehabilitation facility.   6. PEA arrest:  Thought primarily to be a respiratory event. Plan for outpt w/u to r/o ischemia.  Will wait until fluid status improved   Signed, Dossie Arbourim Melady Chow, MD [facility]  10/30/2015, 8:41 AM

## 2015-10-30 NOTE — Progress Notes (Signed)
SATURATION QUALIFICATIONS: (This note is used to comply with regulatory documentation for home oxygen)  Patient Saturations on Room Air at Rest = 84%  Patient Saturations on Room Air while Ambulating = unsafe to ambulate patients with resting RA sats in the 80s  Patient Saturations on N/A Liters of oxygen while Ambulating = N/A  Please briefly explain why patient needs home oxygen:

## 2015-10-30 NOTE — Progress Notes (Signed)
HD tx completed.

## 2015-10-30 NOTE — Progress Notes (Signed)
[facility] [place] [facility]   PATIENT NAME: Karen Rich    MR#:  [id number]  DATE OF BIRTH:  Feb 22, 1976  SUBJECTIVE:   Still continues to have some stridor w/ raspy voice.  Tolerating PO well. Had HD today and tolerated it well.  No other complaints.   REVIEW OF SYSTEMS:    Review of Systems  Constitutional: Negative for fever and chills.  HENT: Negative for congestion and tinnitus.   Eyes: Negative for blurred vision and double vision.  Respiratory: Positive for cough, shortness of breath and stridor. Negative for wheezing.   Cardiovascular: Positive for leg swelling. Negative for chest pain, orthopnea and PND.  Gastrointestinal: Negative for nausea, vomiting, abdominal pain and diarrhea.  Genitourinary: Negative for dysuria and hematuria.  Neurological: Positive for weakness (generalized). Negative for dizziness, sensory change and focal weakness.  All other systems reviewed and are negative.   Nutrition: Renal Tolerating Diet: Yes Tolerating PT: Eval noted.    DRUG ALLERGIES:  No Known Allergies  VITALS:  Blood pressure 131/58, pulse 81, temperature 97 F (36.1 C), temperature source Axillary, resp. rate 18, height 5' 3.5" (1.613 m), weight 89 kg (196 lb 3.4 oz), last menstrual period 09/08/2015, SpO2 97 %.  PHYSICAL EXAMINATION:   Physical Exam  GENERAL:  39 y.o.-year-old patient lying in the bed in NAD.     EYES: Pupils equal, round, reactive to light and accommodation. No scleral icterus. Extraocular muscles intact.  HEENT: Head atraumatic, normocephalic. Oropharynx and nasopharynx clear.  NECK:  Supple, + JVD. No thyroid enlargement, no tenderness.  LUNGS:Good air entry bilaterally.  B/l rhonchi, wheezing.  No use of accessory muscles. + Stridor. CARDIOVASCULAR: S1, S2 normal. No murmurs, rubs, or gallops.  ABDOMEN: Soft, nontender, nondistended. Bowel sounds present. No organomegaly or mass.  EXTREMITIES: No cyanosis, clubbing,  +1-2 pitting edema bilaterally and generalized anasarca. NEUROLOGIC: Cranial nerves II through XII are intact. No focal Motor or sensory deficits b/l.  Globally weak PSYCHIATRIC: The patient is alert and oriented x 3. Good affect.  SKIN: No obvious rash, lesion, or ulcer.    LABORATORY PANEL:   CBC  Recent Labs Lab 10/30/15 0443  WBC 15.2*  HGB 7.1*  HCT 22.5*  PLT 299   ------------------------------------------------------------------------------------------------------------------  Chemistries   Recent Labs Lab 10/28/15 0500  NA 135  K 3.6  CL 101  CO2 25  GLUCOSE 311*  BUN 71*  CREATININE 3.93*  CALCIUM 8.5*   ------------------------------------------------------------------------------------------------------------------  Cardiac Enzymes No results for input(s): TROPONINI in the last 168 hours. ------------------------------------------------------------------------------------------------------------------  RADIOLOGY:  No results found.   ASSESSMENT AND PLAN:   39 year old female with past medical history of combined diastolic/systolic CHF, CKD stage III, hypertension, diabetes, who originally presented to the hospital due to shortness of breath, swelling and noted to be in acute on chronic respiratory failure and in CHF.  #1 acute on chronic respiratory failure with hypoxia-this is likely secondary to volume overload and CHF and now with some bronchitis too.   - cont. With HD, IV steroids and improving but slowly  - cont. Duonebs.   #2 acute on chronic combined systolic/diastolic CHF - improving and about 25 L (-) since admission. She was slow to respond to diuresis given her chronic renal failure. -Continue Lasix, Coreg, Imdur, hydralazine. -Appreciate cardiology, nephro input and cont. HD as tolerated per Nephrology.  #3 acute on chronic renal failure - now progressed to ESRD as per Nephrology and likely due to diabetic Nephropathy and cardiorenal  in  nature too.   - pt. To have perm cath placed tomorrow and to be started on scheduled dialysis.  - cont. Intermitted HD w/ temp cath for now.  Had HD today.   #4 stridor/raspy voice-due to vocal cord swelling from recent intubation.  - cont. IV Solumedrol, duonebs and improving.  - seen by speech and no aspiration risk presently and tolerating PO well.   #5 diabetes type 2 without renal complications- BS improved and stable.  - cont. Lantus, cont. SSI.   #6 hypertension-continue Coreg, hydralazine, Imdur.  #7 Anemia of Chronic disease - due to Renal failure and follow Hg.  - Transfuse if Hg < 7.  Cont. Epo with dialysis.    All the records are reviewed and case discussed with Care Management/Social Worker. Management plans discussed with the patient, family and they are in agreement.  CODE STATUS: Full  DVT Prophylaxis: Heparin subcutaneous  TOTAL TIME TAKING CARE OF THIS PATIENT: 30 minutes.   POSSIBLE D/C IN 2-3 DAYS, DEPENDING ON CLINICAL CONDITION.   Houston Siren M.D on 10/30/2015 at 3:56 PM  Between 7am to 6pm - Pager - [phone]  After 6pm go to [email or web address] - password [email or web address] [facility]  [place] [facility]  Office  [phone]  CC: Primary care physician; Sherrie Mustache, MD

## 2015-10-30 NOTE — Progress Notes (Signed)
Post hd tx 

## 2015-10-30 NOTE — Progress Notes (Signed)
HD tx start 

## 2015-10-30 NOTE — Progress Notes (Signed)
Pre-hd tx 

## 2015-10-30 NOTE — Progress Notes (Signed)
Subjective:  Patient underwent dialysis treatment today 1500 cc removed  Patient seen during dialysis Tolerating well  Extra treatment today for volume optimization  Objective:  Vital signs in last 24 hours:  Temp:  [97.6 F (36.4 C)-98.4 F (36.9 C)] 97.7 F (36.5 C) (11/22 0915) Pulse Rate:  [78-88] 78 (11/22 1030) Resp:  [16-25] 16 (11/22 1030) BP: (133-189)/(64-94) 146/72 mmHg (11/22 1030) SpO2:  [84 %-100 %] 100 % (11/22 1030) Weight:  [88.9 kg (195 lb 15.8 oz)-90.7 kg (199 lb 15.3 oz)] 89.5 kg (197 lb 5 oz) (11/22 0915)  Weight change: -0.7 kg (-1 lb 8.7 oz) Filed Weights   10/29/15 1306 10/30/15 0534 10/30/15 0915  Weight: 90.7 kg (199 lb 15.3 oz) 88.9 kg (195 lb 15.8 oz) 89.5 kg (197 lb 5 oz)    Intake/Output:    Intake/Output Summary (Last 24 hours) at 10/30/15 1108 Last data filed at 10/30/15 0816  Gross per 24 hour  Intake    270 ml  Output   1850 ml  Net  -1580 ml    HEMODIALYSIS FLOW SHEET:  Blood Flow Rate (mL/min): 300 mL/min Arterial Pressure (mmHg): -100 mmHg Venous Pressure (mmHg): 130 mmHg Transmembrane Pressure (mmHg): 60 mmHg Ultrafiltration Rate (mL/min): 1000 mL/min Dialysate Flow Rate (mL/min): 600 ml/min Conductivity: Machine : 14 Conductivity: Machine : 14 Dialysis Fluid Bolus: Normal Saline Bolus Amount (mL): 250 mL Intra-Hemodialysis Comments: ...Dr.Shooter Tangen bedside rounding on patient.   Physical Exam: General: No acute distress, sitting up on bed  HEENT Anicteric, moist oral mucous membranes  Neck supple  Pulm/lungs Clear to auscultation, normal respiratory effort  CVS/Heart No rub or gallop  Abdomen:  Soft, nontender  Extremities: 2+ pitting edema, tight  Neurologic: Alert, oriented  Skin: Chronic venostasis changes over lower extremities  Access: Right femoral temporary catheter       Basic Metabolic Panel:   Recent Labs Lab 10/24/15 0954 10/25/15 0414 10/26/15 0939 10/27/15 0500 10/28/15 0500  10/28/15 1515  NA 135 136 135 138 135  --   K 3.7 3.9 4.0 4.0 3.6  --   CL 102 102 103 102 101  --   CO2 --   GLUCOSE 207* 149* 152* 275* 311*  --   BUN 72* 70* 71* 77* 71*  --   CREATININE 4.64* 4.43* 4.16* 4.29* 3.93*  --   CALCIUM 8.4* 8.7* 8.5* 8.9 8.5*  --   PHOS  --   --   --   --   --  3.9     CBC:  Recent Labs Lab 10/25/15 0414 10/25/15 1817 10/27/15 0500 10/28/15 1515 10/30/15 0443  WBC  --   --  8.6 14.5* 15.2*  HGB 7.2*  --  7.4* 7.2* 7.1*  HCT  --   --  23.3* 23.2* 22.5*  MCV  --   --  85.1 85.3 85.1  PLT  --  346 379 354 299      Microbiology:  Recent Results (from the past 720 hour(s))  C difficile quick scan w PCR reflex     Status: None   Collection Time: 10/10/15 10:21 PM  Result Value Ref Range Status   C Diff antigen NEGATIVE NEGATIVE Final   C Diff toxin NEGATIVE NEGATIVE Final   C Diff interpretation Negative for C. difficile  Final  C difficile quick scan w PCR reflex     Status: None   Collection Time: 10/12/15 10:35 AM  Result Value Ref Range Status  C Diff antigen NEGATIVE NEGATIVE Final   C Diff toxin NEGATIVE NEGATIVE Final   C Diff interpretation Negative for C. difficile  Final  MRSA PCR Screening     Status: None   Collection Time: 10/17/15  9:02 AM  Result Value Ref Range Status   MRSA by PCR NEGATIVE NEGATIVE Final    Comment:        The GeneXpert MRSA Assay (FDA approved for NASAL specimens only), is one component of a comprehensive MRSA colonization surveillance program. It is not intended to diagnose MRSA infection nor to guide or monitor treatment for MRSA infections.   C difficile quick scan w PCR reflex     Status: None   Collection Time: 10/18/15  5:50 AM  Result Value Ref Range Status   C Diff antigen NEGATIVE NEGATIVE Final   C Diff toxin NEGATIVE NEGATIVE Final   C Diff interpretation Negative for C. difficile  Final    Coagulation Studies: No results for input(s): LABPROT, INR in the  last 72 hours.  Urinalysis: No results for input(s): COLORURINE, LABSPEC, PHURINE, GLUCOSEU, HGBUR, BILIRUBINUR, KETONESUR, PROTEINUR, UROBILINOGEN, NITRITE, LEUKOCYTESUR in the last 72 hours.  Invalid input(s): APPERANCEUR    Imaging: No results found.   Medications:     . amLODipine  10 mg Oral Daily  . antiseptic oral rinse  7 mL Mouth Rinse BID  . carvedilol  25 mg Oral BID WC  . collagenase   Topical Daily  . epoetin (EPOGEN/PROCRIT) injection  10,000 Units Intravenous Q M,W,F-HD  . fluticasone  2 spray Each Nare Daily  . furosemide  80 mg Oral BID  . heparin subcutaneous  5,000 Units Subcutaneous 3 times per day  . hydrALAZINE  100 mg Oral 3 times per day  . insulin aspart  0-15 Units Subcutaneous TID WC  . insulin aspart  0-5 Units Subcutaneous QHS  . insulin glargine  10 Units Subcutaneous QHS  . isosorbide mononitrate  60 mg Oral Daily  . methylPREDNISolone (SOLU-MEDROL) injection  40 mg Intravenous Daily  . sodium chloride  10-40 mL Intracatheter Q12H   sodium chloride, sodium chloride, sodium chloride, acetaminophen **OR** acetaminophen, alteplase, heparin, hydrALAZINE, ipratropium-albuterol, lidocaine (PF), lidocaine-prilocaine, loperamide, ondansetron **OR** ondansetron (ZOFRAN) IV, pentafluoroprop-tetrafluoroeth, sodium chloride, sodium chloride  Assessment/ Plan:  39 y.o. female with medical problems of Diabetes type II (since Age 39, poorly controlled) with severe complications including retinopathy and kidney disease, hypertension, obesity who was [facility] for evaluation of acute renal failure and anasarca.  1. 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 PATIENT HAS LIKELY PROGRESSED TO ESRD secondary to cardiorenal syndrome from systolic acute congestive heart failure Echocardiogram from 11/3 with EF of 40-45%. Also with hypotension during cardiac arrest.  Chronic kidney  disease with proteinuria (nephrotic range: 4.9 grams) secondary to diabetic nephropathy. Baseline creatinine of 1.2 in 08/2014.    - Patient seen during dialysis Tolerating well  - Discussed with patient regarding placement of PermCath and outpatient dialysis. She has agreed to it.  - discussed with Dr Wyn Quakerew Re: Permcath placement tomorrow - start d/c planning and placement for outpatient dialysis  2. Acute exacerbation of systolic congestive heart failure with anasarca: currently with significant volume overload - UF target  As tolerated, may continue lasix as well.   3. 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  - management per hospitalist.   4.  Hypertension: blood pressure noted to be high. Hopefully with UF this should improve. Continue amlodipine/coreg/hydralazine/imdur.  5. Anemia of CKD: start  Epogen. Check iron studies   LOS: 20 Karen Rich [id number] AM

## 2015-10-30 NOTE — Progress Notes (Signed)
OT Cancellation Note  Patient Details Name: Felicity PellegriniCamille Y Kalter MRN: [id number] DOB: 1976-04-25   Cancelled Treatment:    Reason Eval/Treat Not Completed: Patient at procedure or test/ unavailable.   Ocie CornfieldHuff, Alexx Mcburney M 10/30/2015, 11:39 AM

## 2015-10-30 NOTE — Care Management (Addendum)
Spoke with patient about return home with home health.  Have spoken with [facility] about the following equipment: hospital bed, walker and bsc.  May also need to consider a wheelchair.  Patient is going to require home health physical therapy.  Have asked [facility] to give CM cash price for physical therapy visit. Anticipate will asked [facility] for assist.  Patient will be compromised without further physical therapy.  Patient says she will have round the clock caregiver support at home.  She is to have a perm cath placed tomorrow and referral is in progress for outpatient hemodialysis.  Discussed during progression that patient needs to be sitting for dialysis and not going in the bed.

## 2015-10-30 NOTE — Care Management (Signed)
MEDICAL NECESSITY FOR HOSPITAL BED AND [facility]   HOSPITAL BED  Patient suffers from congestive  and has trouble breathing at night when head is elevated less 30 degrees. Bed wedges do not provide enough elevation to resolve breathing issues.  Dyspnea causes patient to require frequent changes in body position which cannot be achieved with a normal bed  [facility]   Patient suffers from heart failure with significant edema of lower extremity edema which impairs their ability to perform daily activities like walking , toileting, grooming in the home. A  walker will not resolve issue with performing activities of daily living if having to ambulate distance greater that 10 - 15 feet.  A wheelchair will allow patient to safely perform daily activities.  Patient can safely propel the wheelchair in the home or has a caregiver who can provide assistance.

## 2015-10-30 NOTE — Progress Notes (Signed)
RN notified of pt having a 9 beat run of V tach and then resuming a NSR. Pt resting comfortably upon assessment. Strip placed in chart. Will continue to monitor. Syliva Overmanassie A Jader Desai, RN

## 2015-10-30 NOTE — Care Management Note (Signed)
I am [facility]

## 2015-10-31 ENCOUNTER — Inpatient Hospital Stay: Payer: Medicaid Other

## 2015-10-31 ENCOUNTER — Encounter
Admission: EM | Disposition: A | Discharge status: Home / Self Care | Payer: Self-pay | Source: Home / Self Care | Attending: Internal Medicine

## 2015-10-31 HISTORY — PX: PERIPHERAL VASCULAR CATHETERIZATION: SHX172C

## 2015-10-31 LAB — CBC
HCT: 20.6 % — ABNORMAL LOW (ref 35.0–47.0)
Hemoglobin: 6.5 g/dL — ABNORMAL LOW (ref 12.0–16.0)
MCH: 26.6 pg (ref 26.0–34.0)
MCHC: 31.6 g/dL — ABNORMAL LOW (ref 32.0–36.0)
MCV: 83.9 fL (ref 80.0–100.0)
PLATELETS: 292 10*3/uL (ref 150–440)
RBC: 2.46 MIL/uL — AB (ref 3.80–5.20)
RDW: 16.2 % — AB (ref 11.5–14.5)
WBC: 13.1 10*3/uL — AB (ref 3.6–11.0)

## 2015-10-31 LAB — BASIC METABOLIC PANEL
ANION GAP: 4 — AB (ref 5–15)
BUN: 40 mg/dL — AB (ref 6–20)
CALCIUM: 8 mg/dL — AB (ref 8.9–10.3)
CO2: 32 mmol/L (ref 22–32)
Chloride: 98 mmol/L — ABNORMAL LOW (ref 101–111)
Creatinine, Ser: 2.58 mg/dL — ABNORMAL HIGH (ref 0.44–1.00)
GFR calc Af Amer: 25 mL/min — ABNORMAL LOW (ref >60)
GFR, EST NON AFRICAN AMERICAN: 22 mL/min — AB (ref >60)
GLUCOSE: 214 mg/dL — AB (ref 65–99)
POTASSIUM: 3.6 mmol/L (ref 3.5–5.1)
SODIUM: 134 mmol/L — AB (ref 135–145)

## 2015-10-31 LAB — GLUCOSE, CAPILLARY
GLUCOSE-CAPILLARY: 212 mg/dL — AB (ref 65–99)
Glucose-Capillary: 228 mg/dL — ABNORMAL HIGH (ref 65–99)
Glucose-Capillary: 215 mg/dL — ABNORMAL HIGH (ref 65–99)

## 2015-10-31 LAB — HCG, QUANTITATIVE, PREGNANCY: HCG, BETA CHAIN, QUANT, S: 2 m[IU]/mL (ref <5)

## 2015-10-31 LAB — ABO/RH: ABO/RH(D): O NEG

## 2015-10-31 LAB — PREPARE RBC (CROSSMATCH)

## 2015-10-31 SURGERY — DIALYSIS/PERMA CATHETER INSERTION
Anesthesia: Moderate Sedation

## 2015-10-31 MED ORDER — FENTANYL CITRATE (PF) 100 MCG/2ML IJ SOLN
INTRAMUSCULAR | Status: AC
  Filled 2015-10-31: qty 2

## 2015-10-31 MED ORDER — HEPARIN SODIUM (PORCINE) 10000 UNIT/ML IJ SOLN
INTRAMUSCULAR | Status: AC
  Filled 2015-10-31: qty 1

## 2015-10-31 MED ORDER — SODIUM CHLORIDE 0.9 % IV SOLN: Freq: Once | INTRAVENOUS | Status: DC

## 2015-10-31 MED ORDER — ACETAMINOPHEN 325 MG PO TABS
650.0000 mg | ORAL_TABLET | Freq: Once | ORAL | Status: AC
  Administered 2015-10-31: 650 mg via ORAL
  Filled 2015-10-31: qty 2

## 2015-10-31 MED ORDER — LIDOCAINE-EPINEPHRINE (PF) 1 %-1:200000 IJ SOLN
INTRAMUSCULAR | Status: AC
  Filled 2015-10-31: qty 30

## 2015-10-31 MED ORDER — MIDAZOLAM HCL 2 MG/2ML IJ SOLN
INTRAMUSCULAR | Status: DC | PRN
  Administered 2015-10-31: 1 mg via INTRAVENOUS

## 2015-10-31 MED ORDER — CEFAZOLIN SODIUM 1-5 GM-% IV SOLN
1.0000 g | Freq: Once | INTRAVENOUS | Status: AC
  Administered 2015-10-31: 1 g via INTRAVENOUS
  Filled 2015-10-31: qty 50

## 2015-10-31 MED ORDER — FENTANYL CITRATE (PF) 100 MCG/2ML IJ SOLN
INTRAMUSCULAR | Status: DC | PRN
  Administered 2015-10-31: 25 ug via INTRAVENOUS

## 2015-10-31 MED ORDER — SODIUM CHLORIDE 0.9 % IV SOLN: INTRAVENOUS | Status: DC

## 2015-10-31 MED ORDER — CHLORHEXIDINE GLUCONATE CLOTH 2 % EX PADS: 6.0000 | MEDICATED_PAD | Freq: Once | CUTANEOUS | Status: DC

## 2015-10-31 MED ORDER — FUROSEMIDE 10 MG/ML IJ SOLN
20.0000 mg | Freq: Once | INTRAMUSCULAR | Status: AC
  Administered 2015-10-31: 20 mg via INTRAVENOUS
  Filled 2015-10-31: qty 2

## 2015-10-31 MED ORDER — HYDRALAZINE HCL 25 MG PO TABS
25.0000 mg | ORAL_TABLET | Freq: Three times a day (TID) | ORAL | Status: DC
  Administered 2015-10-31 – 2015-11-02 (×6): 25 mg via ORAL
  Filled 2015-10-31 (×6): qty 1

## 2015-10-31 MED ORDER — MIDAZOLAM HCL 5 MG/5ML IJ SOLN
INTRAMUSCULAR | Status: AC
  Filled 2015-10-31: qty 5

## 2015-10-31 MED ORDER — LIDOCAINE-EPINEPHRINE (PF) 1 %-1:200000 IJ SOLN
INTRAMUSCULAR | Status: DC | PRN
  Administered 2015-10-31: 10 mL via INTRADERMAL

## 2015-10-31 SURGICAL SUPPLY — 11 items
BIOPATCH RED 1 DISK 7.0 (GAUZE/BANDAGES/DRESSINGS) ×2 IMPLANT
BIOPATCH RED 1IN DISK 7.0MM (GAUZE/BANDAGES/DRESSINGS) ×1
CATH PALINDROME RT-P 15FX19CM (CATHETERS) ×3 IMPLANT
DERMABOND ADVANCED (GAUZE/BANDAGES/DRESSINGS) ×2
DERMABOND ADVANCED .7 DNX12 (GAUZE/BANDAGES/DRESSINGS) ×1 IMPLANT
PACK ANGIOGRAPHY (CUSTOM PROCEDURE TRAY) ×3 IMPLANT
SUT MNCRL 4-0 (SUTURE) ×2
SUT MNCRL 4-0 27XMFL (SUTURE) ×1
SUT PROLENE 0 CT 1 30 (SUTURE) ×3 IMPLANT
SUTURE MNCRL 4-0 27XMF (SUTURE) ×1 IMPLANT
TOWEL OR 17X26 4PK STRL BLUE (TOWEL DISPOSABLE) ×3 IMPLANT

## 2015-10-31 NOTE — Progress Notes (Signed)
Patient: Karen Rich / Admit Date: 10/10/2015 / Date of Encounter: 10/31/2015, 8:40 AM   Subjective: Still has a raspy voice, cough   Review of Systems: Review of Systems  Respiratory: Positive for shortness of breath.   Cardiovascular: Positive for orthopnea and leg swelling.  Gastrointestinal: Negative.   Musculoskeletal: Negative.   Neurological: Positive for weakness.  Psychiatric/Behavioral: Negative.   All other systems reviewed and are negative.   Objective: Telemetry: NSR Physical Exam: Blood pressure 145/66, pulse 82, temperature 98.5 F (36.9 C), temperature source Oral, resp. rate 18, height 5' 3.5" (1.613 m), weight 200 lb 4.8 oz (90.855 kg), last menstrual period 09/08/2015, SpO2 95 %. Body mass index is 34.92 kg/(m^2). General: chronically ill appearing  Head: Normocephalic, atraumatic, sclera non-icteric, no xanthomas, nares are without discharge. Neck: Negative for carotid bruits. JVP mildly  Lungs: Clear bilaterally to auscultation without wheezes, rales, or rhonchi. Breathing is mildly labored, dull at bases. Heart: RRR S1 S2 without murmurs, rubs, or gallops.  Abdomen: Soft, non-tender, distended with normoactive bowel sounds. No rebound/guarding. Extremities: No clubbing or cyanosis. Edema is improving   Neuro: Alert and oriented X 3. Moves all extremities spontaneously. Psych:  Responds to questions appropriately with a normal affect.   Intake/Output Summary (Last 24 hours) at 10/31/15 0840 Last data filed at 10/31/15 0801  Gross per 24 hour  Intake      0 ml  Output   2500 ml  Net  -2500 ml    Inpatient Medications:  . amLODipine  10 mg Oral Daily  . antiseptic oral rinse  7 mL Mouth Rinse BID  . carvedilol  25 mg Oral BID WC  . collagenase   Topical Daily  . epoetin (EPOGEN/PROCRIT) injection  10,000 Units Intravenous Q M,W,F-HD  . fluticasone  2 spray Each Nare Daily  . furosemide  80 mg Oral BID  . heparin subcutaneous  5,000 Units  Subcutaneous 3 times per day  . hydrALAZINE  100 mg Oral 3 times per day  . insulin aspart  0-15 Units Subcutaneous TID WC  . insulin aspart  0-5 Units Subcutaneous QHS  . insulin glargine  10 Units Subcutaneous QHS  . isosorbide mononitrate  60 mg Oral Daily  . methylPREDNISolone (SOLU-MEDROL) injection  40 mg Intravenous Daily  . sodium chloride  10-40 mL Intracatheter Q12H   Infusions:    Labs:  Recent Labs  10/28/15 1515 10/31/15 0537  NA  --  134*  K  --  3.6  CL  --  98*  CO2  --  32  GLUCOSE  --  214*  BUN  --  40*  CREATININE  --  2.58*  CALCIUM  --  8.0*  PHOS 3.9  --    No results for input(s): AST, ALT, ALKPHOS, BILITOT, PROT, ALBUMIN in the last 72 hours.  Recent Labs  10/30/15 0443 10/31/15 0537  WBC 15.2* 13.1*  HGB 7.1* 6.5*  HCT 22.5* 20.6*  MCV 85.1 83.9  PLT 299 292   No results for input(s): CKTOTAL, CKMB, TROPONINI in the last 72 hours. Invalid input(s): POCBNP No results for input(s): HGBA1C in the last 72 hours.   Weights: Filed Weights   10/30/15 1237 10/30/15 1304 10/31/15 0526  Weight: 191 lb 12.8 oz (87 kg) 196 lb 3.4 oz (89 kg) 200 lb 4.8 oz (90.855 kg)     Radiology/Studies:  Dg Chest 2 View  10/20/2015  CLINICAL DATA:   IMPRESSION: Significant interval improvement as detailed  above. Electronically Signed   By: Bary RichardStan  Maynard M.D.   On: 10/20/2015 12:29      Assessment and Plan  39 y.o. female   1. Acute on chronic combined systolic/diastolic chf:  still significantly fluid overloaded.  Improving with dialysis.  Echo shows normal LV systolic function now - EF 55-60%, grade 2 diastolic dysfunction   2. Anemia: Plans per Int. Med and nephrology   3. Essential HTN:  Improving   4. CKD III:  ESRD now on HD,    5. Disposition:   deconditioned. Will likely need placement in a rehabilitation facility.   6. PEA arrest:  Thought primarily to be a respiratory event. Plan for outpt w/u to r/o ischemia.  Will  wait until fluid status improved  Will sign off.  Call for questions   Mayra Jolliffe, Deloris PingPhilip J, MD  10/31/2015 8:43 AM    [facility] [address],  [address] [place], [place]  [address] Pager [phone] Phone: [phone]; Fax: [phone]   [facility]  [address] [place], [place]  [address] [phone]   Fax [phone]

## 2015-10-31 NOTE — Progress Notes (Signed)
Speech Therapy Note: reviewed chart notes; consulted NSG and MD re: pt's status. Met w/ pt who indicated she is continuing to cough w/ any eating/drinking of her renal diet. It has not increased since the bedside eval was completed; no further decline in respiratory status per MD notes. However, pt continues to have SOB and the cough at rest and during po intake. Cardiologist indicated significant fluid overload requiring dialysis; wound care following and agreed. This fluid overload could be impacting pt's breathing during swallowing; cough sensation(?). Rec an objective swallow study to address concern for aspiration during po intake. MD agreed. Order placed; ST will f/u today.

## 2015-10-31 NOTE — Progress Notes (Signed)
PT Cancellation Note  Patient Details Name: Karen Rich MRN: [id number] DOB: 1976-07-11   Cancelled Treatment:    Reason Eval/Treat Not Completed: Medical issues which prohibited therapy (Pt's Hgb 6.5 (<7.1 PT contraindicated).)  Will re-attempt PT treatment session at a later date/time.   [name] 10/31/2015, 8:26 AM [name], PT [phone]

## 2015-10-31 NOTE — Progress Notes (Signed)
Subjective:  Patient scheduled for a PermCath later today Has been undergoing dialysis for volume optimization Otherwise doing okay   Objective:  Vital signs in last 24 hours:  Temp:  [97 F (36.1 C)-98.6 F (37 C)] 98.6 F (37 C) (11/23 1120) Pulse Rate:  [77-84] 77 (11/23 1120) Resp:  [16-18] 17 (11/23 1120) BP: (116-145)/(44-66) 126/54 mmHg (11/23 1120) SpO2:  [95 %-100 %] 100 % (11/23 1120) Weight:  [89 kg (196 lb 3.4 oz)-90.855 kg (200 lb 4.8 oz)] 90.855 kg (200 lb 4.8 oz) (11/23 0526)  Weight change: -2.7 kg (-5 lb 15.2 oz) Filed Weights   10/30/15 1237 10/30/15 1304 10/31/15 0526  Weight: 87 kg (191 lb 12.8 oz) 89 kg (196 lb 3.4 oz) 90.855 kg (200 lb 4.8 oz)    Intake/Output:    Intake/Output Summary (Last 24 hours) at 10/31/15 1243 Last data filed at 10/31/15 0949  Gross per 24 hour  Intake    120 ml  Output      0 ml  Net    120 ml    HEMODIALYSIS FLOW SHEET:11/22  Blood Flow Rate (mL/min): 300 mL/min Arterial Pressure (mmHg): -100 mmHg Venous Pressure (mmHg): 130 mmHg Transmembrane Pressure (mmHg): 60 mmHg Ultrafiltration Rate (mL/min): 1000 mL/min Dialysate Flow Rate (mL/min): 600 ml/min Conductivity: Machine : 14 Conductivity: Machine : 14 Dialysis Fluid Bolus: Normal Saline Bolus Amount (mL): 250 mL Intra-Hemodialysis Comments: .Marland Kitchentx completed.   Physical Exam: General: No acute distress, sitting up  HEENT Anicteric, moist oral mucous membranes  Neck supple  Pulm/lungs Clear to auscultation, normal respiratory effort  CVS/Heart No rub or gallop  Abdomen:  Soft, nontender  Extremities: 2+ pitting edema, tight  Neurologic: Alert, oriented  Skin: Chronic venostasis changes over lower extremities  Access: Right femoral temporary catheter       Basic Metabolic Panel:   Recent Labs Lab 10/25/15 0414 10/26/15 0939 10/27/15 0500 10/28/15 0500 10/28/15 1515 10/31/15 0537  NA 136 135 138 135  --  134*  K 3.9 4.0 4.0 3.6  --  3.6   CL 102 103 102 101  --  98*  CO2 --  32  GLUCOSE 149* 152* 275* 311*  --  214*  BUN 70* 71* 77* 71*  --  40*  CREATININE 4.43* 4.16* 4.29* 3.93*  --  2.58*  CALCIUM 8.7* 8.5* 8.9 8.5*  --  8.0*  PHOS  --   --   --   --  3.9  --      CBC:  Recent Labs Lab 10/25/15 0414 10/25/15 1817 10/27/15 0500 10/28/15 1515 10/30/15 0443 10/31/15 0537  WBC  --   --  8.6 14.5* 15.2* 13.1*  HGB 7.2*  --  7.4* 7.2* 7.1* 6.5*  HCT  --   --  23.3* 23.2* 22.5* 20.6*  MCV  --   --  85.1 85.3 85.1 83.9  PLT  --  346 379 354 299 292      Microbiology:  Recent Results (from the past 720 hour(s))  C difficile quick scan w PCR reflex     Status: None   Collection Time: 10/10/15 10:21 PM  Result Value Ref Range Status   C Diff antigen NEGATIVE NEGATIVE Final   C Diff toxin NEGATIVE NEGATIVE Final   C Diff interpretation Negative for C. difficile  Final  C difficile quick scan w PCR reflex     Status: None   Collection Time: 10/12/15 10:35 AM  Result Value  Ref Range Status   C Diff antigen NEGATIVE NEGATIVE Final   C Diff toxin NEGATIVE NEGATIVE Final   C Diff interpretation Negative for C. difficile  Final  MRSA PCR Screening     Status: None   Collection Time: 10/17/15  9:02 AM  Result Value Ref Range Status   MRSA by PCR NEGATIVE NEGATIVE Final    Comment:        The GeneXpert MRSA Assay (FDA approved for NASAL specimens only), is one component of a comprehensive MRSA colonization surveillance program. It is not intended to diagnose MRSA infection nor to guide or monitor treatment for MRSA infections.   C difficile quick scan w PCR reflex     Status: None   Collection Time: 10/18/15  5:50 AM  Result Value Ref Range Status   C Diff antigen NEGATIVE NEGATIVE Final   C Diff toxin NEGATIVE NEGATIVE Final   C Diff interpretation Negative for C. difficile  Final    Coagulation Studies: No results for input(s): LABPROT, INR in the last 72 hours.  Urinalysis: No  results for input(s): COLORURINE, LABSPEC, PHURINE, GLUCOSEU, HGBUR, BILIRUBINUR, KETONESUR, PROTEINUR, UROBILINOGEN, NITRITE, LEUKOCYTESUR in the last 72 hours.  Invalid input(s): APPERANCEUR    Imaging: No results found.   Medications:     . sodium chloride   Intravenous Once  . acetaminophen  650 mg Oral Once  . amLODipine  10 mg Oral Daily  . antiseptic oral rinse  7 mL Mouth Rinse BID  . carvedilol  25 mg Oral BID WC  . epoetin (EPOGEN/PROCRIT) injection  10,000 Units Intravenous Q M,W,F-HD  . fluticasone  2 spray Each Nare Daily  . furosemide  20 mg Intravenous Once  . furosemide  80 mg Oral BID  . heparin subcutaneous  5,000 Units Subcutaneous 3 times per day  . hydrALAZINE  25 mg Oral 3 times per day  . insulin aspart  0-15 Units Subcutaneous TID WC  . insulin aspart  0-5 Units Subcutaneous QHS  . insulin glargine  10 Units Subcutaneous QHS  . isosorbide mononitrate  60 mg Oral Daily  . methylPREDNISolone (SOLU-MEDROL) injection  40 mg Intravenous Daily  . sodium chloride  10-40 mL Intracatheter Q12H   sodium chloride, sodium chloride, sodium chloride, acetaminophen **OR** acetaminophen, alteplase, heparin, hydrALAZINE, ipratropium-albuterol, loperamide, ondansetron **OR** ondansetron (ZOFRAN) IV, sodium chloride, sodium chloride  Assessment/ Plan:  39 y.o. female with medical problems of Diabetes type II (since Age 89, poorly controlled) with severe complications including retinopathy and kidney disease, hypertension, obesity who [facility] for evaluation of acute renal failure and anasarca.  1. 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 PATIENT HAS LIKELY PROGRESSED TO ESRD secondary to cardiorenal syndrome from systolic acute congestive heart failure Echocardiogram from 11/3 with EF of 40-45%. Also with hypotension during cardiac arrest.  Chronic kidney disease with  proteinuria (nephrotic range: 4.9 grams) secondary to diabetic nephropathy. Baseline creatinine of 1.2 in 08/2014.    - PermCath to be placed later today  - [facility]   2. Acute exacerbation of systolic congestive heart failure with anasarca: currently with significant volume overload - UF with dialysis as tolerated, may continue lasix as well.   3. 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  - management per hospitalist.   4. Hypertension: blood pressure noted  to be high. Hopefully with UF this should improve. Continue amlodipine/coreg/hydralazine/imdur. Decrease dose of hydralazine now that her volume is better controlled  5. Anemia of CKD and iron deficiency: continue Epogen. Iron saturation is 7% Hemoglobin has dropped to 6.5 Plan for blood transfusion today Also plan for IV Venofer during dialysis starting next treatment GI evaluation will likely be delayed due to recent cardiac event   LOS: 21 Jaz Mallick 11/23/201612:43 PM

## 2015-10-31 NOTE — Progress Notes (Signed)
HD tx completed.

## 2015-10-31 NOTE — H&P (Signed)
  [facility] History & Physical Update  The patient was interviewed and re-examined.  The patient's previous History and Physical has been reviewed and is unchanged.  There is no change in the plan of care. We plan to proceed with the scheduled procedure.  DEW,JASON, MD  10/31/2015, 1:30 PM

## 2015-10-31 NOTE — Progress Notes (Signed)
Patient ID: Karen Rich, female   DOB: 1976-11-22, 39 y.o.   MRN: [id number] [facility] PROGRESS NOTE  PCP: Sherrie Mustache, MD  HPI/Subjective: Patient seen earlier in the day. I was called by the nurse that the patient once the to leave. She was very upset that she ate breakfast and then may not be able to get her catheter. She still upset that her voice has not come back. She wants to go home as soon as possible.  Objective: Filed Vitals:   10/31/15 1120 10/31/15 1332  BP: 126/54 153/84  Pulse: 77 80  Temp: 98.6 F (37 C) 97.8 F (36.6 C)  Resp: 17 20    Filed Weights   10/30/15 1237 10/30/15 1304 10/31/15 0526  Weight: 87 kg (191 lb 12.8 oz) 89 kg (196 lb 3.4 oz) 90.855 kg (200 lb 4.8 oz)    ROS: Review of Systems  Constitutional: Negative for fever and chills.  Eyes: Negative for blurred vision.  Respiratory: Positive for cough and shortness of breath.   Cardiovascular: Negative for chest pain.  Gastrointestinal: Negative for nausea, vomiting, abdominal pain, diarrhea and constipation.  Genitourinary: Negative for dysuria.  Musculoskeletal: Negative for joint pain.  Neurological: Negative for dizziness and headaches.   Exam: Physical Exam  Constitutional: She is oriented to person, place, and time.  HENT:  Nose: No mucosal edema.  Mouth/Throat: No oropharyngeal exudate or posterior oropharyngeal edema.  Eyes: Conjunctivae, EOM and lids are normal. Pupils are equal, round, and reactive to light.  Neck: No JVD present. Carotid bruit is not present. No edema present. No thyroid mass and no thyromegaly present.  Cardiovascular: S1 normal and S2 normal.  Exam reveals no gallop.   Murmur heard.  Systolic murmur is present with a grade of 3/6  Pulses:      Dorsalis pedis pulses are 2+ on the right side, and 2+ on the left side.  Respiratory: No respiratory distress. She has no wheezes. She has no rhonchi. She has rales in the right lower field and the  left lower field.  GI: Soft. Bowel sounds are normal. There is no tenderness.  Musculoskeletal:       Right knee: She exhibits swelling.       Left knee: She exhibits swelling.       Right ankle: She exhibits swelling.       Left ankle: She exhibits swelling.  Lymphadenopathy:    She has no cervical adenopathy.  Neurological: She is alert and oriented to person, place, and time. No cranial nerve deficit.  Skin: Skin is warm. Nails show no clubbing.  Chronic lower extremity ulcerations right leg worse than the left leg  Psychiatric: She has a normal mood and affect.    Data Reviewed: Basic Metabolic Panel:  Recent Labs Lab 10/25/15 0414 10/26/15 0939 10/27/15 0500 10/28/15 0500 10/28/15 1515 10/31/15 0537  NA 136 135 138 135  --  134*  K 3.9 4.0 4.0 3.6  --  3.6  CL 102 103 102 101  --  98*  CO2 --  32  GLUCOSE 149* 152* 275* 311*  --  214*  BUN 70* 71* 77* 71*  --  40*  CREATININE 4.43* 4.16* 4.29* 3.93*  --  2.58*  CALCIUM 8.7* 8.5* 8.9 8.5*  --  8.0*  PHOS  --   --   --   --  3.9  --    CBC:  Recent Labs Lab 10/25/15 0414  10/25/15 1817 10/27/15 0500 10/28/15 1515 10/30/15 0443 10/31/15 0537  WBC  --   --  8.6 14.5* 15.2* 13.1*  HGB 7.2*  --  7.4* 7.2* 7.1* 6.5*  HCT  --   --  23.3* 23.2* 22.5* 20.6*  MCV  --   --  85.1 85.3 85.1 83.9  PLT  --  346 379 354 299 292    CBG:  Recent Labs Lab 10/30/15 1313 10/30/15 1658 10/30/15 2059 10/31/15 0754 10/31/15 1115  GLUCAP 176* 328* 284* 212* 228*    Scheduled Meds: . sodium chloride   Intravenous Once  . acetaminophen  650 mg Oral Once  . amLODipine  10 mg Oral Daily  . antiseptic oral rinse  7 mL Mouth Rinse BID  . carvedilol  25 mg Oral BID WC  .  ceFAZolin (ANCEF) IV  1 g Intravenous Once  . epoetin (EPOGEN/PROCRIT) injection  10,000 Units Intravenous Q M,W,F-HD  . fluticasone  2 spray Each Nare Daily  . furosemide  20 mg Intravenous Once  . furosemide  80 mg Oral BID  . heparin  subcutaneous  5,000 Units Subcutaneous 3 times per day  . hydrALAZINE  25 mg Oral 3 times per day  . insulin aspart  0-15 Units Subcutaneous TID WC  . insulin aspart  0-5 Units Subcutaneous QHS  . insulin glargine  10 Units Subcutaneous QHS  . isosorbide mononitrate  60 mg Oral Daily  . methylPREDNISolone (SOLU-MEDROL) injection  40 mg Intravenous Daily  . sodium chloride  10-40 mL Intracatheter Q12H    Assessment/Plan:  1. Acute on chronic respiratory failure with hypoxia. Continue oxygen supplementation. May end up needing home oxygen. 2. Acute on chronic combined systolic and diastolic heart failure. Dialysis to help out with fluid management. Continue Coreg, Imdur and hydralazine. No ACE inhibitor secondary to worsening renal failure. 3. End-stage renal disease. Patient to have a PermCath placed today and removal of groin catheter. Patient will have dialysis today. Patient will need an outpatient dialysis slot prior to going home. 4. Stridor and raspy voice secondary to vocal cord swelling from intubation. Patient is on Solu-Medrol. 5. Anemia of chronic disease- transfuse 1 unit of packed red blood cells today with dialysis for hemoglobin of 6.5 6. Essential hypertension continue Coreg hydralazine and Imdur 7. Type 2 diabetes mellitus- continue low-dose Lantus and sliding scale 8. PEA arrest- cardiology due to ischemic workup as outpatient 9. Weakness- patient refused rehabilitation and wants to go home as soon as possible.  Code Status:     Code Status Orders        Start     Ordered   10/10/15 1940  Full code   Continuous     10/10/15 1939     Disposition Plan: Home soon  Consultants:  Nephrology  Cardiology  ENT  Vascular surgery  Time spent: 25 minutes  Alford HighlandWIETING, Tahjay Binion  [facility]

## 2015-10-31 NOTE — Progress Notes (Signed)
Pre-hd tx 

## 2015-10-31 NOTE — Evaluation (Signed)
Objective Swallowing Evaluation:    Patient Details  Name: Karen Rich MRN: [id number] Date of Birth: 19-Feb-1976  Today's Date: 10/31/2015 Time: SLP Start Time (ACUTE ONLY): 1130-SLP Stop Time (ACUTE ONLY): 1221 SLP Time Calculation (min) (ACUTE ONLY): 51 min  Past Medical History:  Past Medical History  Diagnosis Date  . Edema   . Hyperlipidemia   . Hyperglycemia   . Fatigue   . Irregular heart beat   . Hypertension   . Diabetes mellitus without complication (HCC)   . Renal disorder   . Renal failure     stage 3   Past Surgical History:  Past Surgical History  Procedure Laterality Date  . None     HPI: Pt is a 39 y.o. female with a known history of diabetes type 2, hyperlipidemia, hypertension, chronic kidney disease stage III who presents to the ED with complaint of swelling of her body diffusely. She reports that she stop taking all her medication including her insulin a few months ago indicating she was "overwhelmed" by the medications. She also reports that she's been having shortness of breath ongoing for the past few months and is unable to lay flat and has to sleep in a chair. Pt had a cardiorespiratory arrest was intubated for approximately for 3 days was extubated on the 10th of this month and since that time, she has had persistent hoarseness. She hasn't had hemoptysis but does have intermittent coughing. ENT consulted on 11.15.16 and assessment indicated hoarseness-likely related to intubation (traumatic) but that it should resolve with time. The sluggishness of the vocal folds is likely related to the multiple medical problems. Follow up as an outpatient rec'd in 3-4 weeks once the edema has resolved. ENT indicated that prednisone would be helpful for this edema but with her diabetes this was certainly raise her blood sugar. Primary MD consulted re: this information and will f/u w/ potential steroids for the laryngeal edema. Pt stated she is able to eat her meals but  does have intermittent coughing.  Subjective: pt awake, verbally conversive. Hoarse vocal quality.    Subjective: Patient behavior: (alertness, ability to follow instructions, etc.): Patient is alert and verbally responsive; very hoarse vocal quality.  Chief complaint: Patient with coughing while eating and drinking.  Per cardiologist, the patient does have significant fluid overload requiring dialysis.   Objective:  Radiological Procedure: A videoflouroscopic evaluation of oral-preparatory, reflex initiation, and pharyngeal phases of the swallow was performed; as well as a screening of the upper esophageal phase.  I. POSTURE: Upright in MBS chair  II. VIEW: Lateral and A-P  III. COMPENSATORY STRATEGIES: Small bites and sips  IV. BOLUSES ADMINISTERED:   Thin Liquid: 2 small sips, 2 moderate sips   Nectar-thick Liquid: 2 small sips, 1 moderate sip    Puree: 2 teaspoon boluses   Mechanical Soft: 1/4 graham cracker in applesauce  V. RESULTS OF EVALUATION: A. ORAL PREPARATORY PHASE: (The lips, tongue, and velum are observed for strength and coordination) Within normal limits       **Overall Severity Rating:WNL  B. SWALLOW INITIATION/REFLEX: (The reflex is normal if "triggered" by the time the bolus reached the base of the tongue) Triggers while falling from the valleculae to the pyriform sinuses.  **Overall Severity Rating: Mild  C. PHARYNGEAL PHASE: (Pharyngeal function is normal if the bolus shows rapid, smooth, and continuous transit through the pharynx and there is no pharyngeal residue after the swallow): decreased tongue base retraction, decreased hyolaryngeal movement.  Mild pharyngeal  residue (valleculae > pyriform sinuses)  Residue is bilateral per A-P view  **Overall Severity Rating: mild  D. LARYNGEAL PENETRATION: (Material entering into the laryngeal inlet/vestibule but not aspirated) None  E. ASPIRATION: None  F. ESOPHAGEAL PHASE: (Screening of the upper esophagus)  No observed abnormalities within the cervical esophagus  ASSESSMENT: This 39 year old woman; S/P intubation X3 days, extubated 10/18/2015; is presenting with mild oropharyngeal dysphagia characterized by delayed pharyngeal swallow initiation, decreased tongue base retraction and hyolaryngeal movement with mild pharyngeal residue (valleculae > pyriform sinuses).  Oral control of the bolus including oral hold, rotary mastication, and anterior to posterior transfer is within normal limits.  There is no observed laryngeal penetration or tracheal aspiration.  This presentation is consistent with edematous pharyngeal tissue and the patient does not appear to be at significant risk for prandial aspiration.  PLAN/RECOMMENDATIONS:  A. Diet: Regular as tolerated   B. Swallowing Precautions: Standard   C. Recommended consultation to   D. Therapy recommendations SLP will follow up as needed   E. Results and recommendations were discussed with primary inpatient SLP   Dollene PrimroseSusan G Jaeline Whobrey, MS/CCC- SLP  Leandrew KoyanagiAbernathy, Susie 10/31/2015, 12:22 PM

## 2015-10-31 NOTE — Progress Notes (Signed)
HD tx start 

## 2015-10-31 NOTE — Op Note (Signed)
OPERATIVE NOTE    PRE-OPERATIVE DIAGNOSIS: 1. ESRD   POST-OPERATIVE DIAGNOSIS: same as above  PROCEDURE: 1. Ultrasound guidance for vascular access to the left internal jugular vein 2. Fluoroscopic guidance for placement of catheter 3. Placement of a 19 cm tip to cuff tunneled hemodialysis catheter via the left internal jugular vein  SURGEON: Festus BarrenJason Erline Siddoway, MD  ANESTHESIA:  Local/MCS  ESTIMATED BLOOD LOSS: 50 cc  FINDING(S): 1.  Patent left internal jugular vein  SPECIMEN(S):  None  INDICATIONS:   Felicity PellegriniCamille Y Ostenson is a 39 y.o. female who presents with renal failure that has not improved despite a three week hospital stay.  The patient needs long term dialysis access for their ESRD, and a Permcath is necessary.  Risks and benefits are discussed and informed consent is obtained.    DESCRIPTION: After obtaining full informed written consent, the patient was brought back to the vascular suited. The patient's left neck and chest were sterilely prepped and draped in a sterile surgical field was created.  The left internal jugular vein was visualized with ultrasound and found to be patent. It was then accessed under direct ultrasound guidance and a permanent image was recorded. A wire was placed. After skin nick and dilatation, the peel-away sheath was placed over the wire. I then turned my attention to an area under the clavicle. Approximately 1-2 fingerbreadths below the clavicle a small counterincision was created and tunneled from the subclavicular incision to the access site. Using fluoroscopic guidance, a 19 centimeter tip to cuff tunneled hemodialysis catheter was selected, and tunneled from the subclavicular incision to the access site. It was then placed through the peel-away sheath and the peel-away sheath was removed. Using fluoroscopic guidance the catheter tips were parked in the right atrium. The appropriate distal connectors were placed. It withdrew blood well and flushed easily with  heparinized saline and a concentrated heparin solution was then placed. It was secured to the chest wall with 2 Prolene sutures. The access incision was closed single 4-0 Monocryl. A 4-0 Monocryl pursestring suture was placed around the exit site. Sterile dressings were placed. The patient tolerated the procedure well and was taken to the recovery room in stable condition.  COMPLICATIONS: None  CONDITION: Stable  Kyira Volkert  10/31/2015, 3:37 PM

## 2015-10-31 NOTE — Consult Note (Addendum)
WOC wound consult note Reason for Consult: Edema bilateral lower legs.  Chronic ulcers to right lateral malleolus and right calf near medial malleolus. MASD resolving to buttocks.  Patient is independent with transfers to bedside commode now and is continent.  Dr Renae GlossWieting is at bedside and agrees that Unnas boots may help with edema at this time.  Wound type:venous insufficiency to right lower extremities.  Pressure Ulcer POA: Yes MASD to buttocks, resolving.  Measurement:Right lateral malleolus 1 cm x 1 cm 100% pink and moist Right lateral calf 3 cm x 3cm x 0.1 cm wound bed 100% pale pink and moist MASD to right buttocks 0.5 cm x 1 cm x 0.1 cm scattered nonintact lesions to perineal area,  improved.  Wound ZOX:WRUEbed:pale pink nongranulating.  Enzymatic debridement was effective. Will discontinue Drainage (amount, consistency, odor) Minimal serosanguinous to right leg Periwound:Edematous. Resolving blisters to left and right lower leg noted.  They are intact Dressing procedure/placement/frequency:Cleanse bilateral lower legs with soap and water and pat dry.  Apply zinc layer, secured with Coban.  Change weekly on Monday and Thursday.  Begin 11/05/15.  Initial application was 10/31/15. Barrier cream to buttocks if needed.  WOC team will follow.  Maple HudsonKaren Parisha Beaulac RN BSN CWON Pager [phone]

## 2015-10-31 NOTE — Care Management (Signed)
Wound consult.  Patient has unna boots placed for resolving blisters and chronic ulcers to left and right lower legs.  Orders are for them to be changed Mondays and Thursday.

## 2015-11-01 LAB — TYPE AND SCREEN
ABO/RH(D): O NEG
Antibody Screen: NEGATIVE
UNIT DIVISION: 0

## 2015-11-01 LAB — GLUCOSE, CAPILLARY
GLUCOSE-CAPILLARY: 205 mg/dL — AB (ref 65–99)
GLUCOSE-CAPILLARY: 247 mg/dL — AB (ref 65–99)
GLUCOSE-CAPILLARY: 294 mg/dL — AB (ref 65–99)
Glucose-Capillary: 267 mg/dL — ABNORMAL HIGH (ref 65–99)

## 2015-11-01 LAB — HEMOGLOBIN: Hemoglobin: 7.8 g/dL — ABNORMAL LOW (ref 12.0–16.0)

## 2015-11-01 MED ORDER — PREDNISONE 10 MG PO TABS: 10.0000 mg | ORAL_TABLET | Freq: Every day | ORAL | Status: DC

## 2015-11-01 NOTE — Progress Notes (Addendum)
MD Hilton SinclairWeiting was made aware that subclavian could not be used as IV access in emergency situation. MD Cherylann RatelLateef was consulted about using it. MD Weiting was ok with no IV access.

## 2015-11-01 NOTE — Progress Notes (Signed)
Subjective:  Patient had dialysis treatment yesterday. Tolerated well.  she reports that she is feeling better since she started dialysis. Outpatient dialysis planning is ongoing.  Objective:  Vital signs in last 24 hours:  Temp:  [97.6 F (36.4 C)-98.6 F (37 C)] 98.2 F (36.8 C) (11/24 0408) Pulse Rate:  [75-86] 85 (11/24 0737) Resp:  [14-118] 118 (11/24 0737) BP: (126-199)/(54-178) 164/72 mmHg (11/24 0737) SpO2:  [94 %-100 %] 98 % (11/24 0732) Weight:  [88.9 kg (195 lb 15.8 oz)-91.4 kg (201 lb 8 oz)] 89.177 kg (196 lb 9.6 oz) (11/24 0359)  Weight change: 1.9 kg (4 lb 3 oz) Filed Weights   10/31/15 1630 10/31/15 1953 11/01/15 0359  Weight: 91.4 kg (201 lb 8 oz) 88.9 kg (195 lb 15.8 oz) 89.177 kg (196 lb 9.6 oz)    Intake/Output:    Intake/Output Summary (Last 24 hours) at 11/01/15 1034 Last data filed at 11/01/15 0830  Gross per 24 hour  Intake    675 ml  Output    150 ml  Net    525 ml    HEMODIALYSIS FLOW SHEET:  Blood Flow Rate (mL/min): 300 mL/min Arterial Pressure (mmHg): -110 mmHg Venous Pressure (mmHg): 110 mmHg Transmembrane Pressure (mmHg): 50 mmHg Ultrafiltration Rate (mL/min): 1000 mL/min Dialysate Flow Rate (mL/min): 600 ml/min Conductivity: Machine : 14.1 Conductivity: Machine : 14.1 Dialysis Fluid Bolus: Normal Saline Bolus Amount (mL): 250 mL Intra-Hemodialysis Comments: 3000ml...tx completed.   Physical Exam: General: No acute distress, sitting up on bed  HEENT Anicteric, moist oral mucous membranes  Neck supple  Pulm/lungs Clear to auscultation, normal respiratory effort  CVS/Heart S1S2 no rubs  Abdomen:  Soft, nontender bowel sounds prsent  Extremities: 2+ pitting edema, tight  Neurologic: Alert, oriented  Skin: Chronic venostasis changes over lower extremities  Access: Right femoral temporary catheter, left IJ permcath       Basic Metabolic Panel:   Recent Labs Lab 10/26/15 0939 10/27/15 0500 10/28/15 0500 10/28/15 1515  10/31/15 0537  NA 135 138 135  --  134*  K 4.0 4.0 3.6  --  3.6  CL 103 102 101  --  98*  CO2 25 26 25   --  32  GLUCOSE 152* 275* 311*  --  214*  BUN 71* 77* 71*  --  40*  CREATININE 4.16* 4.29* 3.93*  --  2.58*  CALCIUM 8.5* 8.9 8.5*  --  8.0*  PHOS  --   --   --  3.9  --      CBC:  Recent Labs Lab 10/25/15 1817 10/27/15 0500 10/28/15 1515 10/30/15 0443 10/31/15 0537 11/01/15 0735  WBC  --  8.6 14.5* 15.2* 13.1*  --   HGB  --  7.4* 7.2* 7.1* 6.5* 7.8*  HCT  --  23.3* 23.2* 22.5* 20.6*  --   MCV  --  85.1 85.3 85.1 83.9  --   PLT 346 379 354 299 292  --       Microbiology:  Recent Results (from the past 720 hour(s))  C difficile quick scan w PCR reflex     Status: None   Collection Time: 10/10/15 10:21 PM  Result Value Ref Range Status   C Diff antigen NEGATIVE NEGATIVE Final   C Diff toxin NEGATIVE NEGATIVE Final   C Diff interpretation Negative for C. difficile  Final  C difficile quick scan w PCR reflex     Status: None   Collection Time: 10/12/15 10:35 AM  Result Value Ref Range  Status   C Diff antigen NEGATIVE NEGATIVE Final   C Diff toxin NEGATIVE NEGATIVE Final   C Diff interpretation Negative for C. difficile  Final  MRSA PCR Screening     Status: None   Collection Time: 10/17/15  9:02 AM  Result Value Ref Range Status   MRSA by PCR NEGATIVE NEGATIVE Final    Comment:        The GeneXpert MRSA Assay (FDA approved for NASAL specimens only), is one component of a comprehensive MRSA colonization surveillance program. It is not intended to diagnose MRSA infection nor to guide or monitor treatment for MRSA infections.   C difficile quick scan w PCR reflex     Status: None   Collection Time: 10/18/15  5:50 AM  Result Value Ref Range Status   C Diff antigen NEGATIVE NEGATIVE Final   C Diff toxin NEGATIVE NEGATIVE Final   C Diff interpretation Negative for C. difficile  Final    Coagulation Studies: No results for input(s): LABPROT, INR in the  last 72 hours.  Urinalysis: No results for input(s): COLORURINE, LABSPEC, PHURINE, GLUCOSEU, HGBUR, BILIRUBINUR, KETONESUR, PROTEINUR, UROBILINOGEN, NITRITE, LEUKOCYTESUR in the last 72 hours.  Invalid input(s): APPERANCEUR    Imaging: No results found.   Medications:     . amLODipine  10 mg Oral Daily  . antiseptic oral rinse  7 mL Mouth Rinse BID  . carvedilol  25 mg Oral BID WC  . Chlorhexidine Gluconate Cloth  6 each Topical Once  . epoetin (EPOGEN/PROCRIT) injection  10,000 Units Intravenous Q M,W,F-HD  . fluticasone  2 spray Each Nare Daily  . furosemide  80 mg Oral BID  . heparin subcutaneous  5,000 Units Subcutaneous 3 times per day  . hydrALAZINE  25 mg Oral 3 times per day  . insulin aspart  0-15 Units Subcutaneous TID WC  . insulin aspart  0-5 Units Subcutaneous QHS  . isosorbide mononitrate  60 mg Oral Daily  . [START ON 11/02/2015] predniSONE  10 mg Oral Q breakfast   sodium chloride, acetaminophen **OR** acetaminophen, alteplase, heparin, ipratropium-albuterol, loperamide, ondansetron **OR** [DISCONTINUED] ondansetron (ZOFRAN) IV  Assessment/ Plan:  39 y.o. female with medical problems of Diabetes type II (since Age 21, poorly controlled) with severe complications including retinopathy and kidney disease, hypertension, obesity who was admitted to [facility] on 10/10/2015 for evaluation of acute renal failure and anasarca.  1. 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 PATIENT HAS LIKELY PROGRESSED TO ESRD secondary to cardiorenal syndrome from systolic acute congestive heart failure Echocardiogram from 11/3 with EF of 40-45%. Also with hypotension during cardiac arrest.  Chronic kidney disease with proteinuria (nephrotic range: 4.9 grams) secondary to diabetic nephropathy. Baseline creatinine of 1.2 in 08/2014.    - Patient had hemodialysis yesterday. She tolerated this well. No acute indication for  dialysis today. We will plan for dialysis again tomorrow.  2. Acute exacerbation of systolic congestive heart failure with anasarca: currently with significant volume overload - We will plan for ultrafiltration target of 1.5-2 kg tomorrow.   3. 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  - management per hospitalist.   4. Hypertension: Blood pressure labile. Currently 164/72. Continue amlodipine, Coreg, hydralazine.  5. Anemia of CKD: Continue Epogen 10,000 units IV with dialysis. Most recent hemoglobin 7.8  LOS: 22 [address] [place] 11/24/201610:34 AM

## 2015-11-01 NOTE — Progress Notes (Signed)
Transferred from 2A. Alert and oriented.sitting up with coughing spell.

## 2015-11-01 NOTE — Progress Notes (Signed)
Patient ID: Karen Rich, female   DOB: Oct 21, 1976, 39 y.o.   MRN: [id number] [facility] PROGRESS NOTE  PCP: Sherrie Mustache, MD  HPI/Subjective: Patient feeling a little bit better. Every day she feels a low bit stronger. Still with very hoarse voice. Some cough. Wants to go home as soon as possible.  Objective: Filed Vitals:   11/01/15 1121 11/01/15 1147  BP: 156/77 159/75  Pulse: 81   Temp:    Resp: 22 22    Filed Weights   10/31/15 1630 10/31/15 1953 11/01/15 0359  Weight: 91.4 kg (201 lb 8 oz) 88.9 kg (195 lb 15.8 oz) 89.177 kg (196 lb 9.6 oz)    ROS: Review of Systems  Constitutional: Negative for fever and chills.  Eyes: Negative for blurred vision.  Respiratory: Positive for cough and shortness of breath.   Cardiovascular: Negative for chest pain.  Gastrointestinal: Negative for nausea, vomiting, abdominal pain, diarrhea and constipation.  Genitourinary: Negative for dysuria.  Musculoskeletal: Negative for joint pain.  Neurological: Negative for dizziness and headaches.   Exam: Physical Exam  Constitutional: She is oriented to person, place, and time.  HENT:  Nose: No mucosal edema.  Mouth/Throat: No oropharyngeal exudate or posterior oropharyngeal edema.  Eyes: Conjunctivae, EOM and lids are normal. Pupils are equal, round, and reactive to light.  Neck: No JVD present. Carotid bruit is not present. No edema present. No thyroid mass and no thyromegaly present.  Cardiovascular: S1 normal and S2 normal.  Exam reveals no gallop.   Murmur heard.  Systolic murmur is present with a grade of 3/6  Pulses:      Dorsalis pedis pulses are 2+ on the right side, and 2+ on the left side.  Respiratory: No respiratory distress. She has no wheezes. She has no rhonchi. She has rales in the right lower field and the left lower field.  GI: Soft. Bowel sounds are normal. There is no tenderness.  Musculoskeletal:       Right knee: She exhibits swelling.       Left  knee: She exhibits swelling.       Right ankle: She exhibits swelling.       Left ankle: She exhibits swelling.  Lymphadenopathy:    She has no cervical adenopathy.  Neurological: She is alert and oriented to person, place, and time. No cranial nerve deficit.  Skin: Skin is warm. Nails show no clubbing.  Chronic lower extremity ulcerations right leg worse than the left leg  Psychiatric: She has a normal mood and affect.    Data Reviewed: Basic Metabolic Panel:  Recent Labs Lab 10/26/15 0939 10/27/15 0500 10/28/15 0500 10/28/15 1515 10/31/15 0537  NA 135 138 135  --  134*  K 4.0 4.0 3.6  --  3.6  CL 103 102 101  --  98*  CO2 --  32  GLUCOSE 152* 275* 311*  --  214*  BUN 71* 77* 71*  --  40*  CREATININE 4.16* 4.29* 3.93*  --  2.58*  CALCIUM 8.5* 8.9 8.5*  --  8.0*  PHOS  --   --   --  3.9  --    CBC:  Recent Labs Lab 10/25/15 1817 10/27/15 0500 10/28/15 1515 10/30/15 0443 10/31/15 0537 11/01/15 0735  WBC  --  8.6 14.5* 15.2* 13.1*  --   HGB  --  7.4* 7.2* 7.1* 6.5* 7.8*  HCT  --  23.3* 23.2* 22.5* 20.6*  --   MCV  --  85.1 85.3 85.1 83.9  --   PLT 346 379 354 299 292  --     CBG:  Recent Labs Lab 10/31/15 0754 10/31/15 1115 10/31/15 2125 11/01/15 0758 11/01/15 1121  GLUCAP 212* 228* 215* 205* 247*    Scheduled Meds: . amLODipine  10 mg Oral Daily  . antiseptic oral rinse  7 mL Mouth Rinse BID  . carvedilol  25 mg Oral BID WC  . Chlorhexidine Gluconate Cloth  6 each Topical Once  . epoetin (EPOGEN/PROCRIT) injection  10,000 Units Intravenous Q M,W,F-HD  . fluticasone  2 spray Each Nare Daily  . furosemide  80 mg Oral BID  . heparin subcutaneous  5,000 Units Subcutaneous 3 times per day  . hydrALAZINE  25 mg Oral 3 times per day  . insulin aspart  0-15 Units Subcutaneous TID WC  . insulin aspart  0-5 Units Subcutaneous QHS  . isosorbide mononitrate  60 mg Oral Daily  . [START ON 11/02/2015] predniSONE  10 mg Oral Q breakfast     Assessment/Plan:  1. Acute on chronic respiratory failure with hypoxia. Continue oxygen supplementation. May end up needing home oxygen. Prior to going home will check a pulse ox on room air. 2. Acute on chronic combined systolic and diastolic heart failure. Dialysis to help out with fluid management. Continue Coreg, Imdur and hydralazine. No ACE inhibitor secondary to worsening renal failure. Patient is also on twice a day Lasix. 3. End-stage renal disease.  PermCath placed yesterday. I had the nurse remove the temporary dialysis catheter in the right groin. Patient will need an outpatient dialysis slot prior to going home. Outpatient dialysis slot needs to be arranged on Friday for next week. 4. Stridor and raspy voice secondary to vocal cord swelling from intubation. Patient switched to prednisone taper. 5. Anemia of chronic disease- transfuse 1 unit of packed red blood cells yesterday with good response 6. Essential hypertension continue Coreg hydralazine and Imdur 7. Type 2 diabetes mellitus- continue sliding scale 8. PEA arrest- cardiology due to ischemic workup as outpatient 9. Weakness- patient refused rehabilitation and wants to go home as soon as possible.  Code Status:     Code Status Orders        Start     Ordered   10/10/15 1940  Full code   Continuous     10/10/15 1939     Disposition Plan: Home as soon as outpatient dialysis slot scheduled  Consultants:  Nephrology  Cardiology  ENT  Vascular surgery  Time spent: 20 minutes  Alford HighlandWIETING, Deniz Hannan  [facility]

## 2015-11-01 NOTE — Progress Notes (Signed)
Pt returned from dialysis, pt received 1 unit blood in dialysis, pt denied pain on returning to room. Requested food, meal was provided. [facility]. Voided 150[facility]. Pt also incontinent x2 of urine. VSS afebrile . Tele NSR. Remains on fluid restriction. Dr Anne HahnWillis notified to clarify administration of missed lasix dose. Pt received IV lasix in dialysis, po dose was held per Dr Anne Hahnwillis. PICC line intact/patent. L subclavian line intact/patent.

## 2015-11-01 NOTE — Progress Notes (Signed)
Report called to Annabelle on 2C. 2 L of oxygen. No tele. Takes meds ok. Pt reports no pain. Pt has no further concerns at this time.

## 2015-11-02 LAB — CBC
HCT: 24.1 % — ABNORMAL LOW (ref 35.0–47.0)
Hemoglobin: 7.7 g/dL — ABNORMAL LOW (ref 12.0–16.0)
MCH: 27.1 pg (ref 26.0–34.0)
MCHC: 31.7 g/dL — AB (ref 32.0–36.0)
MCV: 85.4 fL (ref 80.0–100.0)
Platelets: 292 10*3/uL (ref 150–440)
RBC: 2.82 MIL/uL — AB (ref 3.80–5.20)
RDW: 16.2 % — AB (ref 11.5–14.5)
WBC: 11.7 10*3/uL — AB (ref 3.6–11.0)

## 2015-11-02 LAB — RENAL FUNCTION PANEL
Albumin: 2.4 g/dL — ABNORMAL LOW (ref 3.5–5.0)
Anion gap: 7 (ref 5–15)
BUN: 47 mg/dL — ABNORMAL HIGH (ref 6–20)
CALCIUM: 8.4 mg/dL — AB (ref 8.9–10.3)
CO2: 29 mmol/L (ref 22–32)
CREATININE: 2.92 mg/dL — AB (ref 0.44–1.00)
Chloride: 100 mmol/L — ABNORMAL LOW (ref 101–111)
GFR, EST AFRICAN AMERICAN: 22 mL/min — AB (ref >60)
GFR, EST NON AFRICAN AMERICAN: 19 mL/min — AB (ref >60)
Glucose, Bld: 295 mg/dL — ABNORMAL HIGH (ref 65–99)
Phosphorus: 3 mg/dL (ref 2.5–4.6)
Potassium: 3.5 mmol/L (ref 3.5–5.1)
SODIUM: 136 mmol/L (ref 135–145)

## 2015-11-02 LAB — GLUCOSE, CAPILLARY
GLUCOSE-CAPILLARY: 264 mg/dL — AB (ref 65–99)
Glucose-Capillary: 147 mg/dL — ABNORMAL HIGH (ref 65–99)

## 2015-11-02 MED ORDER — CARVEDILOL 25 MG PO TABS: 25.0000 mg | ORAL_TABLET | Freq: Two times a day (BID) | ORAL | Status: DC

## 2015-11-02 MED ORDER — FLUTICASONE PROPIONATE 50 MCG/ACT NA SUSP: 2.0000 | Freq: Every day | NASAL | Status: AC

## 2015-11-02 MED ORDER — FUROSEMIDE 80 MG PO TABS: ORAL_TABLET | ORAL | Status: DC

## 2015-11-02 MED ORDER — HYDRALAZINE HCL 25 MG PO TABS: 25.0000 mg | ORAL_TABLET | Freq: Three times a day (TID) | ORAL | Status: DC

## 2015-11-02 MED ORDER — GLIPIZIDE ER 2.5 MG PO TB24: 2.5000 mg | ORAL_TABLET | Freq: Every day | ORAL | Status: DC

## 2015-11-02 MED ORDER — ISOSORBIDE MONONITRATE ER 60 MG PO TB24: 60.0000 mg | ORAL_TABLET | Freq: Every day | ORAL | Status: DC

## 2015-11-02 MED ORDER — FERROUS GLUCONATE 324 (38 FE) MG PO TABS: 324.0000 mg | ORAL_TABLET | Freq: Every day | ORAL | Status: DC

## 2015-11-02 MED ORDER — AMLODIPINE BESYLATE 10 MG PO TABS: 10.0000 mg | ORAL_TABLET | Freq: Every day | ORAL | Status: DC

## 2015-11-02 MED ORDER — GLIPIZIDE ER 2.5 MG PO TB24
2.5000 mg | ORAL_TABLET | Freq: Every day | ORAL | Status: DC
  Filled 2015-11-02: qty 1

## 2015-11-02 MED ORDER — FLUTICASONE PROPIONATE 50 MCG/ACT NA SUSP: 2.0000 | Freq: Every day | NASAL | Status: DC

## 2015-11-02 MED ORDER — FERROUS GLUCONATE 324 (38 FE) MG PO TABS: 324.0000 mg | ORAL_TABLET | Freq: Every day | ORAL | Status: AC

## 2015-11-02 NOTE — Progress Notes (Signed)
Post hd tx 

## 2015-11-02 NOTE — Care Management (Signed)
Attempted to contact Karen Rich and Karen Rich at [employer] for approval for funds for Home PT.Left voicemail, Karen Rich that funds would be made available for Home PT. [employer] may be closed today due to holiday. Contacted Karen HidalgoStephanie Rich at [facility]. Will not start PT until funds approved.

## 2015-11-02 NOTE — Progress Notes (Signed)
PT Cancellation Note  Patient Details Name: Felicity PellegriniCamille Y Mullins MRN: [id number] DOB: 1976-07-14   Cancelled Treatment:    Reason Eval/Treat Not Completed: Patient at procedure or test/unavailable (Pt off floor at dialysis.)  Will re-attempt PT at a later date/time.   Irving Burtonmily Shatera Rennert 11/02/2015, 11:06 AM Hendricks LimesEmily Mario Coronado, PT [phone]

## 2015-11-02 NOTE — Care Management (Signed)
Patient in dialysis this morning.  Spoke with Kim Riddle at [facility]. [facility] [address] [facility]  Relayed information to Dr Renae GlossWieting attending.  Per conversation with Jiles HaroldNann Green NCM for 2A. Patient will need WC, Hospital bed, Walker and Bedside commode DME and will qualify for O2. Contacted Myna HidalgoStephanie George at [facility] for [facility]eral . Will need HH Rn and Aid. Will need payer source for PT. May be able to get Foundation funds for PT visits. Will ask for CSW  As well. PT visits will be $175 per visit.

## 2015-11-02 NOTE — Progress Notes (Signed)
Subjective:  Patient seen during hemodialysis today. Tolerating well. Blood flow rate 400.   Objective:  Vital signs in last 24 hours:  Temp:  [98 F (36.7 C)-98.4 F (36.9 C)] 98.4 F (36.9 C) (11/25 0500) Pulse Rate:  [81-92] 88 (11/25 1000) Resp:  [17-25] 20 (11/25 1000) BP: (117-165)/(57-147) 159/147 mmHg (11/25 1000) SpO2:  [95 %-100 %] 97 % (11/25 1000) Weight:  [90.1 kg (198 lb 10.2 oz)] 90.1 kg (198 lb 10.2 oz) (11/25 0431)  Weight change: -1.3 kg (-2 lb 13.9 oz) Filed Weights   10/31/15 1953 11/01/15 0359 11/02/15 0431  Weight: 88.9 kg (195 lb 15.8 oz) 89.177 kg (196 lb 9.6 oz) 90.1 kg (198 lb 10.2 oz)    Intake/Output:    Intake/Output Summary (Last 24 hours) at 11/02/15 1019 Last data filed at 11/02/15 0852  Gross per 24 hour  Intake    300 ml  Output      2 ml  Net    298 ml    HEMODIALYSIS FLOW SHEET:  Blood Flow Rate (mL/min): 300 mL/min Arterial Pressure (mmHg): -110 mmHg Venous Pressure (mmHg): 110 mmHg Transmembrane Pressure (mmHg): 50 mmHg Ultrafiltration Rate (mL/min): 1000 mL/min Dialysate Flow Rate (mL/min): 600 ml/min Conductivity: Machine : 14.1 Conductivity: Machine : 14.1 Dialysis Fluid Bolus: Normal Saline Bolus Amount (mL): 250 mL Intra-Hemodialysis Comments: ...tx completed.   Physical Exam: General: No acute distress, sitting up on bed  HEENT Anicteric, moist oral mucous membranes  Neck supple  Pulm/lungs Clear to auscultation, normal respiratory effort  CVS/Heart S1S2 no rubs  Abdomen:  Soft, nontender bowel sounds prsent  Extremities: 2+ pitting edema, tight, legs wrapped  Neurologic: Alert, oriented  Skin: Bilateral LE's wrapped  Access: left IJ permcath       Basic Metabolic Panel:   Recent Labs Lab 10/27/15 0500 10/28/15 0500 10/28/15 1515 10/31/15 0537  NA 138 135  --  134*  K 4.0 3.6  --  3.6  CL 102 101  --  98*  CO2 26 25  --  32  GLUCOSE 275* 311*  --  214*  BUN 77* 71*  --  40*  CREATININE  4.29* 3.93*  --  2.58*  CALCIUM 8.9 8.5*  --  8.0*  PHOS  --   --  3.9  --      CBC:  Recent Labs Lab 10/27/15 0500 10/28/15 1515 10/30/15 0443 10/31/15 0537 11/01/15 0735  WBC 8.6 14.5* 15.2* 13.1*  --   HGB 7.4* 7.2* 7.1* 6.5* 7.8*  HCT 23.3* 23.2* 22.5* 20.6*  --   MCV 85.1 85.3 85.1 83.9  --   PLT 379 354 299 292  --       Microbiology:  Recent Results (from the past 720 hour(s))  C difficile quick scan w PCR reflex     Status: None   Collection Time: 10/10/15 10:21 PM  Result Value Ref Range Status   C Diff antigen NEGATIVE NEGATIVE Final   C Diff toxin NEGATIVE NEGATIVE Final   C Diff interpretation Negative for C. difficile  Final  C difficile quick scan w PCR reflex     Status: None   Collection Time: 10/12/15 10:35 AM  Result Value Ref Range Status   C Diff antigen NEGATIVE NEGATIVE Final   C Diff toxin NEGATIVE NEGATIVE Final   C Diff interpretation Negative for C. difficile  Final  MRSA PCR Screening     Status: None   Collection Time: 10/17/15  9:02 AM  Result  Value Ref Range Status   MRSA by PCR NEGATIVE NEGATIVE Final    Comment:        The GeneXpert MRSA Assay (FDA approved for NASAL specimens only), is one component of a comprehensive MRSA colonization surveillance program. It is not intended to diagnose MRSA infection nor to guide or monitor treatment for MRSA infections.   C difficile quick scan w PCR reflex     Status: None   Collection Time: 10/18/15  5:50 AM  Result Value Ref Range Status   C Diff antigen NEGATIVE NEGATIVE Final   C Diff toxin NEGATIVE NEGATIVE Final   C Diff interpretation Negative for C. difficile  Final    Coagulation Studies: No results for input(s): LABPROT, INR in the last 72 hours.  Urinalysis: No results for input(s): COLORURINE, LABSPEC, PHURINE, GLUCOSEU, HGBUR, BILIRUBINUR, KETONESUR, PROTEINUR, UROBILINOGEN, NITRITE, LEUKOCYTESUR in the last 72 hours.  Invalid input(s): APPERANCEUR    Imaging: No  results found.   Medications:     . amLODipine  10 mg Oral Daily  . antiseptic oral rinse  7 mL Mouth Rinse BID  . carvedilol  25 mg Oral BID WC  . Chlorhexidine Gluconate Cloth  6 each Topical Once  . epoetin (EPOGEN/PROCRIT) injection  10,000 Units Intravenous Q M,W,F-HD  . fluticasone  2 spray Each Nare Daily  . furosemide  80 mg Oral BID  . [START ON 11/03/2015] glipiZIDE  2.5 mg Oral Q breakfast  . heparin subcutaneous  5,000 Units Subcutaneous 3 times per day  . hydrALAZINE  25 mg Oral 3 times per day  . insulin aspart  0-15 Units Subcutaneous TID WC  . insulin aspart  0-5 Units Subcutaneous QHS  . isosorbide mononitrate  60 mg Oral Daily   sodium chloride, acetaminophen **OR** acetaminophen, alteplase, heparin, ipratropium-albuterol, loperamide, ondansetron **OR** [DISCONTINUED] ondansetron (ZOFRAN) IV  Assessment/ Plan:  39 y.o. female with medical problems of Diabetes type II (since Age 39, poorly controlled) with severe complications including retinopathy and kidney disease, hypertension, obesity who [facility] for evaluation of acute renal failure and anasarca.  1. End-stage renal disease. PATIENT HAS LIKELY PROGRESSED TO ESRD secondary to cardiorenal syndrome from systolic acute congestive heart failure Echocardiogram from 11/3 with EF of 40-45%. Also with hypotension during cardiac arrest.  Chronic kidney disease with proteinuria (nephrotic range: 4.9 grams) secondary to diabetic nephropathy. Baseline creatinine of 1.2 in 08/2014.   -Patient seen and evaluated during dialysis today. Appears to be tolerating well. Has been accepted for outpatient dialysis. Okay for discharge after dialysis today.  2. Acute exacerbation of systolic congestive heart failure with anasarca: currently with significant volume overload - Ultrafiltration target of 2 kg today during dialysis.  3. 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  - management per hospitalist.   4. Hypertension: Blood pressure continues to be labile.Hopefully this will continue to improve with ultrafiltration and dialysis. For now continue amlodipine, Coreg, hydralazine.  5. Anemia of CKD: Continue Epogen 10,000 units IV with dialysis. Most recent hemoglobin 7.8   LOS: 23 Karen Rich 11/25/201610:19 AM

## 2015-11-02 NOTE — Discharge Summary (Signed)
[facility] - [place] at [facility]   PATIENT NAME: Karen Rich    MR#:  [id number]  DATE OF BIRTH:  October 10, 1976  DATE OF ADMISSION:  10/10/2015 ADMITTING PHYSICIAN: Auburn Bilberry, MD  DATE OF DISCHARGE: 11/02/2015  PRIMARY CARE PHYSICIAN: Sherrie Mustache, MD    ADMISSION DIAGNOSIS:  Acute renal failure (HCC) [N17.9] Acute renal failure, unspecified acute renal failure type (HCC) [N17.9] Acute on chronic congestive heart failure, unspecified congestive heart failure type (HCC) [I50.9]  DISCHARGE DIAGNOSIS:  Principal Problem:   Systolic and diastolic CHF, acute on chronic (HCC) Active Problems:   Acute renal failure (HCC)   Acute respiratory failure with hypoxemia (HCC)   Respiratory failure (HCC)   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 Edema   . Hyperlipidemia   . Hyperglycemia   . Fatigue   . Irregular heart beat   . Hypertension   . Diabetes mellitus without complication (HCC)   . Renal disorder   . Renal failure     stage 3    HOSPITAL COURSE:   1. Acute on chronic respiratory failure with hypoxia. Continue oxygen supplementation 2 L. Prior to going home will qualify for oxygen. Patient was intubated during the hospital course. 2. Acute on chronic combined systolic and diastolic congestive heart failure. Dialysis to help out with fluid management. Patient is also on Lasix, Coreg, Imdur and hydralazine. Can consider ACE inhibitor as outpatient. Anasarca of the lower extremities are being treated with Unna boots. 3. End-stage renal disease patient has a permacath in her left subclavian. Outpatient dialysis at [facility] on [address] Monday Wednesday  Friday at 2 PM. Patient had dialysis today. 4. Stridor and raspy voice secondary to vocal cord swelling from intubation 5. Anemia of chronic disease patient was transfused packed red blood cells during the hospital course. Continue iron as outpatient and Procrit with dialysis. 6. Essential hypertension continue all her medications for blood pressure including Coreg hydralazine and/or Lasix 7. Type 2 diabetes mellitus- low-dose glipizide as outpatient glucometer prescription written 8. PEA arrest- cardiology to do ischemic workup as outpatient 9. Weakness- patient refused to go to rehabilitation and wants to go home. We'll set up home health.  DISCHARGE CONDITIONS:   Satisfactory  CONSULTS OBTAINED:  Treatment Team:  Auburn Bilberry, MD Mosetta Pigeon, MD Linus Salmons, MD Renford Dills, MD  DRUG ALLERGIES:  No Known Allergies  DISCHARGE MEDICATIONS:   Current Discharge Medication List    START taking these medications   Details  amLODipine (NORVASC) 10 MG tablet Take 1 tablet (10 mg total) by mouth daily. Qty: 30 tablet, Refills: 0    carvedilol (COREG) 25 MG tablet Take 1 tablet (25 mg total) by mouth 2 (two) times daily with a meal. Qty: 60 tablet, Refills: 0    ferrous gluconate (FERGON) 324 MG tablet Take 1 tablet (324 mg total) by mouth daily with breakfast. Qty: 30 tablet, Refills: 0    fluticasone (FLONASE) 50 MCG/ACT nasal spray Place 2 sprays into both nostrils daily. Qty: 1 g, Refills: 2    furosemide (LASIX) 80 MG tablet On non-dialysis days Qty: 60 tablet, Refills: 0    glipiZIDE (GLUCOTROL XL) 2.5 MG  24 hr tablet Take 1 tablet (2.5 mg total) by mouth daily with breakfast. Qty: 30 tablet, Refills: 0    hydrALAZINE (APRESOLINE) 25 MG tablet Take 1 tablet (25 mg total) by mouth every 8 (eight) hours. Qty: 90 tablet, Refills: 0    isosorbide mononitrate (IMDUR) 60 MG 24 hr tablet Take 1 tablet (60 mg total) by mouth daily. Qty: 30 tablet, Refills: 0          DISCHARGE INSTRUCTIONS:   Follow-up dialysis Monday Wednesday and Friday Follow-up PMD in 1 week Follow-up with cardiology in 3 weeks  If you experience worsening of your admission symptoms, develop shortness of breath, life threatening emergency, suicidal or homicidal thoughts you must seek medical attention immediately by calling 911 or calling your MD immediately  if symptoms less severe.  You Must read complete instructions/literature along with all the possible adverse reactions/side effects for all the Medicines you take and that have been prescribed to you. Take any new Medicines after you have completely understood and accept all the possible adverse reactions/side effects.   Please note  You were cared for by a hospitalist during your hospital stay. If you have any questions about your discharge medications or the care you received while you were in the hospital after you are discharged, you can call the unit and asked to speak with the hospitalist on call if the hospitalist that took care of you is not available. Once you are discharged, your primary care physician will handle any further medical issues. Please note that NO REFILLS for any discharge medications will be authorized once you are discharged, as it is imperative that you return to your primary care physician (or establish a relationship with a primary care physician if you do not have one) for your aftercare needs so that they can reassess your need for medications and monitor your lab values.    Today   CHIEF COMPLAINT:   Chief Complaint  Patient presents with  . Leg Swelling  . Ascites    HISTORY OF PRESENT ILLNESS:  Karen Rich  is a 39 y.o. female presented with leg swelling and ascites   VITAL SIGNS:  Blood pressure 175/86, pulse 91, temperature 98.1 F (36.7 C), temperature source Oral, resp. rate 22, height 5' 3.5" (1.613 m), weight 88.4 kg (194 lb 14.2 oz), last menstrual period 09/08/2015,  SpO2 78 %.    PHYSICAL EXAMINATION:  GENERAL:  39 y.o.-year-old patient lying in the bed with no acute distress.  EYES: Pupils equal, round, reactive to light and accommodation. No scleral icterus. Extraocular muscles intact.  HEENT: Head atraumatic, normocephalic. Oropharynx and nasopharynx clear.  NECK:  Supple, no jugular venous distention. No thyroid enlargement, no tenderness.  LUNGS: Decreased breath sounds bilaterally, bibasilar rales, no rhonchi or crepitation. No use of accessory muscles of respiration.  CARDIOVASCULAR: S1, S2 normal. 2/6 systolic murmurs, no rubs, or gallops.  ABDOMEN: Soft, non-tender, non-distended. Bowel sounds present. No organomegaly or mass.  EXTREMITIES: 4+ edema, no cyanosis, or clubbing.  NEUROLOGIC: Cranial nerves II through XII are intact. Muscle strength 5/5 in all extremities. Sensation intact. Gait not checked.  PSYCHIATRIC: The patient is alert and oriented x 3.  SKIN: Small lower extremity ulcerations bilaterally.  DATA REVIEW:   CBC  Recent Labs Lab 11/02/15 0949  WBC 11.7*  HGB 7.7*  HCT 24.1*  PLT 292    Chemistries   Recent Labs Lab 11/02/15 0949  NA 136  K 3.5  CL 100*  CO2 29  GLUCOSE 295*  BUN 47*  CREATININE 2.92*  CALCIUM 8.4*     Microbiology Results  Results for orders placed or performed during the hospital encounter of 10/10/15  C difficile quick scan w PCR reflex     Status: None   Collection Time: 10/10/15 10:21 PM  Result Value Ref Range Status   C Diff antigen NEGATIVE NEGATIVE Final   C Diff toxin NEGATIVE NEGATIVE Final   C Diff interpretation Negative for C. difficile  Final  C difficile quick scan w PCR reflex     Status: None   Collection Time: 10/12/15 10:35 AM  Result Value Ref Range Status   C Diff antigen NEGATIVE NEGATIVE Final   C Diff toxin NEGATIVE NEGATIVE Final   C Diff interpretation Negative for C. difficile  Final  MRSA PCR Screening     Status: None   Collection Time: 10/17/15   9:02 AM  Result Value Ref Range Status   MRSA by PCR NEGATIVE NEGATIVE Final    Comment:        The GeneXpert MRSA Assay (FDA approved for NASAL specimens only), is one component of a comprehensive MRSA colonization surveillance program. It is not intended to diagnose MRSA infection nor to guide or monitor treatment for MRSA infections.   C difficile quick scan w PCR reflex     Status: None   Collection Time: 10/18/15  5:50 AM  Result Value Ref Range Status   C Diff antigen NEGATIVE NEGATIVE Final   C Diff toxin NEGATIVE NEGATIVE Final   C Diff interpretation Negative for C. difficile  Final    Management plans discussed with the patient, and she is in agreement.  CODE STATUS:     Code Status Orders        Start     Ordered   10/10/15 1940  Full code   Continuous     10/10/15 1939      TOTAL TIME TAKING CARE OF THIS PATIENT: 35 minutes.    Alford HighlandWIETING, Tokiko Diefenderfer M.D on 11/02/2015 at 3:29 PM  Between 7am to 6pm - Pager - [phone]  After 6pm go to [email or web address] - password [email or web address] [facility]  [facility] [place]  Office  [phone]  CC: Primary care physician; Sherrie MustacheFayegh Jadali, MD

## 2015-11-02 NOTE — Discharge Instructions (Signed)
[facility] Failure [facility] appointment on November 06, 2015 at 10:00am with Clarisa Kindredina Hackney, FNP. Please call [phone] to reschedule.  Heart Failure Heart failure means your heart has trouble pumping blood. This makes it hard for your body to work well. Heart failure is usually a long-term (chronic) condition. You must take good care of yourself and follow your doctor's treatment plan. HOME CARE  Take your heart medicine as told by your doctor.  Do not stop taking medicine unless your doctor tells you to.  Do not skip any dose of medicine.  Refill your medicines before they run out.  Take other medicines only as told by your doctor or pharmacist.  Stay active if told by your doctor. The elderly and people with severe heart failure should talk with a doctor about physical activity.  Eat heart-healthy foods. Choose foods that are without trans fat and are low in saturated fat, cholesterol, and salt (sodium). This includes fresh or frozen fruits and vegetables, fish, lean meats, fat-free or low-fat dairy foods, whole grains, and high-fiber foods. Lentils and dried peas and beans (legumes) are also good choices.  Limit salt if told by your doctor.  Cook in a healthy way. Roast, grill, broil, bake, poach, steam, or stir-fry foods.  Limit fluids as told by your doctor.  Weigh yourself every morning. Do this after you pee (urinate) and before you eat breakfast. Write down your weight to give to your doctor.  Take your blood pressure and write it down if your doctor tells you to.  Ask your doctor how to check your pulse. Check your pulse as told.  Lose weight if told by your doctor.  Stop smoking or chewing tobacco. Do not use gum or patches that help you quit without your doctor's approval.  Schedule and go to doctor visits as told.  Nonpregnant women should have no more than 1 drink a day. Men should have no more than 2 drinks a day. Talk to your doctor about drinking alcohol.  Stop  illegal drug use.  Stay current with shots (immunizations).  Manage your health conditions as told by your doctor.  Learn to manage your stress.  Rest when you are tired.  If it is really hot outside:  Avoid intense activities.  Use air conditioning or fans, or get in a cooler place.  Avoid caffeine and alcohol.  Wear loose-fitting, lightweight, and light-colored clothing.  If it is really cold outside:  Avoid intense activities.  Layer your clothing.  Wear mittens or gloves, a hat, and a scarf when going outside.  Avoid alcohol.  Learn about heart failure and get support as needed.  Get help to maintain or improve your quality of life and your ability to care for yourself as needed. GET HELP IF:   You gain weight quickly.  You are more short of breath than usual.  You cannot do your normal activities.  You tire easily.  You cough more than normal, especially with activity.  You have any or more puffiness (swelling) in areas such as your hands, feet, ankles, or belly (abdomen).  You cannot sleep because it is hard to breathe.  You feel like your heart is beating fast (palpitations).  You get dizzy or light-headed when you stand up. GET HELP RIGHT AWAY IF:   You have trouble breathing.  There is a change in mental status, such as becoming less alert or not being able to focus.  You have chest pain or discomfort.  You faint. MAKE  SURE YOU:   Understand these instructions.  Will watch your condition.  Will get help right away if you are not doing well or get worse.   This information is not intended to replace advice given to you by your health care provider. Make sure you discuss any questions you have with your health care provider.   Document Released: 09/02/2008 Document Revised: 12/15/2014 Document Reviewed: 01/10/2013 Elsevier Interactive Patient Education 2016 Elsevier Inc.  Dialysis Diet Dialysis is a treatment that cleans your blood.  It is used when the kidneys are damaged. When you need dialysis, you should watch your diet. This is because some nutrients can build up in your blood between treatments and make you sick. These nutrients are:  Potassium.  Phosphorus.  Sodium. Your doctor or dietitian will:  Tell you how much of these you can have.  Tell you if you need to look out for other nutrients too.  Help you plan meals.  Tell you how much to drink each day. WHAT DO I NEED TO KNOW ABOUT THIS DIET?  Limit potassium. Potassium is in milk, fruits, and vegetables.  Limit phosphorus. Phosphorus is in milk, cheese, beans, nuts, and carbonated beverages.  Limit salt (sodium). Foods that have a lot sodium include processed and cured meats, ready-made frozen meals, canned vegetables, and salty snack foods.  Do not use salt substitutes.  Try not to eat whole-grain foods and foods that have a lot of fiber.  Follow your doctor's instructions about how much to drink. You may be told to:  Write down what you drink.  Write down foods you eat that are made mostly from water, such as gelatin and soups.  Drink from small cups.  Ask your doctor if you should take a medicine that binds phosphorus.  Take vitamin and mineral supplements only as told by your doctor.  Eat meat, poultry, fish, and eggs. Limit nuts and beans.  Before you cook potatoes, cut them into small pieces. Then boil them in unsalted water.  Drain all fluid from cooked vegetables and canned fruits before you eat them. WHAT FOODS CAN I EAT? Grains White bread. White rice. Cooked cereal. Unsalted popcorn. Tortillas. Pasta. Vegetables Fresh or frozen broccoli, carrots, and green beans. Cabbage. Cauliflower. Celery. Cucumbers. Eggplant. Radishes. Zucchini. Fruits Apples. Fresh or frozen berries. Fresh or canned pears, peaches, and pineapple. Grapes. Plums. Meats and Other Protein Sources Fresh or frozen beef, pork, chicken, and fish. Eggs.  Low-sodium canned tuna or salmon. Dairy Cream cheese. Heavy cream. Ricotta cheese. Beverages Apple cider. Cranberry juice. Grape juice. Lemonade. Black coffee. Condiments Herbs. Spices. Jam and jelly. Honey. Sweets and Desserts Sherbet. Cakes. Cookies. Fats and Oils Olive oil, canola oil, and safflower oil. Other Non-dairy creamer. Non-dairy whipped topping. Homemade broth without salt. The items listed above may not be a complete list of recommended foods or beverages. Contact your dietitian for more option WHAT FOODS ARE NOT RECOMMENDED? Grains Whole-grain bread. Whole-grain pasta. High-fiber cereal. Vegetables Potatoes. Beets. Tomatoes. Winter squash and pumpkin. Asparagus. Spinach. Parsnips. Fruits Star fruit. Bananas. Oranges. Kiwi. Nectarines. Prunes. Melon. Dried fruit. Avocado. Meats and Other Protein Sources Canned, smoked, and cured meats. Occupational hygienist. Sardines. Nuts and seeds. Peanut butter. Beans and legumes. Dairy Milk. Buttermilk. Yogurt. Cheese and cottage cheese. Processed cheese spreads.  Beverages Orange juice. Prune juice. Carbonated soft drinks. Condiments Salt. Salt substitutes. Soy sauce.  Sweets and Desserts Ice cream. Chocolate. Candied nuts. Fats and Oils Butter. Margarine. Other Ready-made frozen meals. Canned soups.  The items listed above may not be a complete list of foods and beverages to avoid. Contact your dietitian for more information.   This information is not intended to replace advice given to you by your health care provider. Make sure you discuss any questions you have with your health care provider.   Document Released: 05/25/2012 Document Revised: 12/15/2014 Document Reviewed: 06/27/2014 Elsevier Interactive Patient Education [employer]

## 2015-11-02 NOTE — Progress Notes (Signed)
Hd tx completed. 

## 2015-11-02 NOTE — Progress Notes (Signed)
This note also relates to the following rows which could not be included: Pulse Rate - Cannot attach notes to unvalidated device data SpO2 - Cannot attach notes to unvalidated device data   Post hd tx 

## 2015-11-02 NOTE — Progress Notes (Signed)
HD tx start 

## 2015-11-02 NOTE — Progress Notes (Signed)
Pre-hd tx 

## 2015-11-02 NOTE — Progress Notes (Signed)
   11/02/15 1413  Oxygen Therapy  SpO2 (!) 78 %  O2 Device Room [id number]

## 2015-11-02 NOTE — Progress Notes (Signed)
SLP Cancellation Note  Patient Details Name: Karen Rich MRN: [id number] DOB: 12/09/1975   Cancelled treatment:       Reason Eval/Treat Not Completed: Other (comment) (Pt being discharged home today) Pt is currently being discharged home. No reports of difficulty w/Po intake at this time. Recommend continue w/regular diet w/thin liquids.    [place] 11/02/2015, 3:11 PM

## 2015-11-02 NOTE — Progress Notes (Signed)
Physical Therapy Treatment Patient Details Name: Karen Rich MRN: [id number] DOB: 1976/01/26 Today's Date: 11/02/2015    History of Present Illness Pt is a 39 yo female who was admitted to the hospital for acute swelling of the body and SOB. It was attributed to acute CHF. On 10/15/15 pt went into cardiac arrest where she was intubated and sedated. Today she is monitored on telemetry and in NSR.   Pt had temporary fem cath but now removed (perm cath placed).    PT Comments    Pt educated on PT recommendations for discharge to STR but pt refusing and reporting plan to discharge home with family today instead.  Pt CGA with transfers and CGA to min assist with ambulation using RW (d/t increased lateral sway).  Pt SOB (O2 sats 93% on 2 L/min via nasal cannula) after short ambulation with RW in room.  Pt appearing very deconditioned with minimal activity.  D/t pt insisting on discharging to home, pt and pt's family (pt's mother present during session) educated extensively on pt's assist needs for functional mobility, DME (pt reports getting hospital bed, 3 in 1 commode, w/c with w/c cushion, and RW for discharge home set up already), pacing and energy conservation methods, and home safety:  All verbalizing good understanding.   Follow Up Recommendations  SNF     Equipment Recommendations  Rolling walker with 5" wheels    Recommendations for Other Services       Precautions / Restrictions Precautions Precautions: Fall Restrictions Weight Bearing Restrictions: No    Mobility  Bed Mobility               General bed mobility comments: Deferred d/t pt already sitting on edge of bed.  Transfers Overall transfer level: Needs assistance Equipment used: Rolling walker (2 wheeled) Transfers: Sit to/from Stand Sit to Stand: Min guard         General transfer comment: increased effort and time to stand; vc's for safety required  Ambulation/Gait Ambulation/Gait assistance: Min  guard;Min assist Ambulation Distance (Feet): 30 Feet Assistive device: Rolling walker (2 wheeled)   Gait velocity: decreased   General Gait Details: decreased B step length/foot clearance/heelstrike; increased lateral sway; assist to steady intermittently   Stairs            Wheelchair Mobility    Modified Rankin (Stroke Patients Only)       Balance Overall balance assessment: Needs assistance Sitting-balance support: Bilateral upper extremity supported;Feet supported Sitting balance-Leahy Scale: Good     Standing balance support: Bilateral upper extremity supported (on RW) Standing balance-Leahy Scale: Fair                      Cognition Arousal/Alertness: Awake/alert Behavior During Therapy: WFL for tasks assessed/performed Overall Cognitive Status: Within Functional Limits for tasks assessed                      Exercises      General Comments   Nursing cleared pt for participation in physical therapy.  Pt agreeable to PT session.  Pt's mother and daughter present during session.      Pertinent Vitals/Pain Pain Assessment: No/denies pain  Vitals stable and WFL throughout treatment session.    Home Living                      Prior Function            PT Goals (current goals  can now be found in the care plan section) Acute Rehab PT Goals Patient Stated Goal: to go home PT Goal Formulation: With patient Time For Goal Achievement: 11/03/15 Potential to Achieve Goals: Fair Progress towards PT goals: Progressing toward goals    Frequency  Min 2X/week    PT Plan Current plan remains appropriate    Co-evaluation             End of Session Equipment Utilized During Treatment: Gait belt Activity Tolerance: Patient limited by fatigue Patient left: in bed;with call bell/phone within reach;with bed alarm set;with family/visitor present     Time: 1445-1510 PT Time Calculation (min) (ACUTE ONLY): 25 min  Charges:   $Therapeutic Activity: 23-37 mins                    G CodesHendricks Limes:      Karen Rich 11/02/2015, 3:52 PM Hendricks LimesEmily Staphany Ditton, PT [phone]

## 2015-11-02 NOTE — Care Management (Signed)
Spoke with patient who is in dialysis at this time. She is Alert and Oriented. All DME ordered per previous notes. Patient stated that she has a hospital bed already. RX for glucometer placed in chart. Informed [facility] RN that patient would need qualifying O2 Saturations this afternoon after returning to floor from Dialysis. Patient lives with mother who will be able to transport her to and from Dialysis. RX electronically sent to [facility]. Patient for DC home today with HH- RN,Aid, CSW, and possibly PT if funds can be established. Will continue to follow.

## 2015-11-02 NOTE — Care Management (Signed)
Reported off to 2C CM.  Judeth CornfieldStephanie with [facility] will check on the prices for physical therapy visits.  [facility].

## 2015-11-02 NOTE — Progress Notes (Signed)
SATURATION QUALIFICATIONS: (This note is used to comply with regulatory documentation for home oxygen)  Patient Saturations on Room Air at Rest = 78%  Patient Saturations on Room Air while Ambulating %  Patient Saturations on Liters of oxygen while Ambulating =%  Please briefly explain why patient needs home oxygen: 

## 2015-11-05 ENCOUNTER — Encounter: Payer: Self-pay | Admitting: Vascular Surgery

## 2015-11-06 ENCOUNTER — Telehealth: Payer: Self-pay | Admitting: *Deleted

## 2015-11-06 ENCOUNTER — Ambulatory Visit: Payer: Medicaid Other | Admitting: Family

## 2015-11-06 ENCOUNTER — Other Ambulatory Visit: Payer: Self-pay

## 2015-11-06 ENCOUNTER — Telehealth: Payer: Self-pay

## 2015-11-06 DIAGNOSIS — R0602 Shortness of breath: Secondary | ICD-10-CM

## 2015-11-06 NOTE — Telephone Encounter (Signed)
Home health nurse Karen HatchetSheila calling stating that pt does not have a PCP anymore and they need orders for wound care. Just until she get a new pcp For leg swelling, looks like ruptured blisters.  Uno-Boots or something like this will help her but need orders. Can take verbal orders over phone, is with patient right now Please advise.

## 2015-11-06 NOTE — Patient Instructions (Signed)
Your physician has requested that you have a lexiscan myoview. For further information please visit [email or web address] Please follow instruction sheet, as given.  [facility] MYOVIEW  Your caregiver has ordered a Stress Test with nuclear imaging. The purpose of this test is to evaluate the blood supply to your heart muscle. This procedure is referred to as a "Non-Invasive Stress Test." This is because other than having an IV started in your vein, nothing is inserted or "invades" your body. Cardiac stress tests are done to find areas of poor blood flow to the heart by determining the extent of coronary artery disease (CAD). Some patients exercise on a treadmill, which naturally increases the blood flow to your heart, while others who are  unable to walk on a treadmill due to physical limitations have a pharmacologic/chemical stress agent called Lexiscan . This medicine will mimic walking on a treadmill by temporarily increasing your coronary blood flow.   Please note: these test may take anywhere between 2-4 hours to complete  PLEASE REPORT TO [facility] MEDICAL MALL ENTRANCE  THE VOLUNTEERS AT THE FIRST DESK WILL DIRECT YOU WHERE TO GO  Date of Procedure: Tuesday, Dec 20, 9am Arrival Time for Procedure: 8:45am   Instructions regarding medication:   __xx__ : Hold diabetes medication (glipizide)  24 hours before procedure  __xx__:  Hold coreg night before procedure and morning of procedure  __xx__:  Hold other medications as follows: lasix the morning of procedure  PLEASE NOTIFY THE OFFICE AT LEAST 24 HOURS IN ADVANCE IF YOU ARE UNABLE TO KEEP YOUR APPOINTMENT.  [phone] AND  PLEASE NOTIFY NUCLEAR MEDICINE AT [facility] AT LEAST 24 HOURS IN ADVANCE IF YOU ARE UNABLE TO KEEP YOUR APPOINTMENT. [phone]  How to prepare for your Myoview test:   Do not eat or drink after midnight  No caffeine for 24 hours prior to test  No smoking 24 hours prior to test.  Your medication may be taken with water.   If your doctor stopped a medication because of this test, do not take that medication.  Ladies, please do not wear dresses.  Skirts or pants are appropriate. Please wear a short sleeve shirt.  No perfume, cologne or lotion.  Wear comfortable walking shoes. No heels!          Cardiac Nuclear Scanning A cardiac nuclear scan is used to check your heart for problems, such as the following:  A portion of the heart is not getting enough blood.  Part of the heart muscle has died, which happens with a heart attack.  The heart wall is not working normally.  In this test, a radioactive dye (tracer) is injected into your bloodstream. After the tracer has traveled to your heart, a scanning device is used to measure how much of the tracer is absorbed by or distributed to various areas of your heart. LET [facility] CARE PROVIDER KNOW ABOUT:  Any allergies you have.  All medicines you are taking, including vitamins, herbs, eye drops, creams, and over-the-counter medicines.  Previous problems you or members of your family have had with the use of anesthetics.  Any blood disorders you have.  Previous surgeries you have had.  Medical conditions you have.  RISKS AND COMPLICATIONS Generally, this is a safe procedure. However, as with any procedure, problems can occur. Possible problems include:   Serious chest pain.  Rapid heartbeat.  Sensation of warmth in your chest. This usually passes quickly. BEFORE THE PROCEDURE Ask your health care provider about changing or stopping  your regular medicines. PROCEDURE This procedure is usually done at a hospital and takes 2-4 hours.  An IV tube is inserted into one of your veins.  Your health care provider will inject a small amount of radioactive tracer through the tube.  You will then wait for 20-40 minutes while the tracer travels through your bloodstream.  You will lie down on an exam table so images of your heart can be taken. Images  will be taken for about 15-20 minutes.  You will exercise on a treadmill or stationary bike. While you exercise, your heart activity will be monitored with an electrocardiogram (ECG), and your blood pressure will be checked.  If you are unable to exercise, you may be given a medicine to make your heart beat faster.  When blood flow to your heart has peaked, tracer will again be injected through the IV tube.  After 20-40 minutes, you will get back on the exam table and have more images taken of your heart.  When the procedure is over, your IV tube will be removed. AFTER THE PROCEDURE  You will likely be able to leave shortly after the test. Unless your health care provider tells you otherwise, you may return to your normal schedule, including diet, activities, and medicines.  Make sure you find out how and when you will get your test results.   This information is not intended to replace advice given to you by your health care provider. Make sure you discuss any questions you have with your health care provider.   Document Released: 12/19/2004 Document Revised: 11/29/2013 Document Reviewed: 11/02/2013 Elsevier Interactive Patient Education [employer] Else[employer] Inc[employer]

## 2015-11-06 NOTE — Telephone Encounter (Signed)
S/w Velna HatchetSheila, home health nurse. Per verbal from Dr. Kirke CorinArida, he is not able to give orders for wound care as pt has not been seen in our office since 3/16. Did not follow up as advised. Suggested nephrologist be contacted for wound care orders. Velna HatchetSheila verbalized understanding and states they have recently taken on pt case and "these things are good to know so we can make sure she follows up with her doctors".

## 2015-11-06 NOTE — Telephone Encounter (Signed)
Felicity PellegriniHaith, Muriah Y Female 1976/06/17 [id number] [address] [place] [id number] [phone] (Home) *Preferred* [phone] (Mobile)      Message  Received: 6 days ago    Marilynne HalstedAmanda R Moody, RN  Shon BatonSharon H Yow, RN            Previous Messages     ----- Message -----   From: Sondra Bargesyan M Dunn, PA-C   Sent: 10/31/2015  8:45 AM    To: Marilynne HalstedAmanda R Moody, RN   Hey can you get this lady set up for an outpatient Lexiscan Myoview in about 3 weeks, pending discharge? She currently is not in a condition to undergo this      S/w pt who is agreeable w/lexi myoview 12/20 at 9am. Reviewed test information. Pt verbalized understanding. Mailed packet regarding time, location, meds to hold, etc to pt home.

## 2015-11-19 ENCOUNTER — Ambulatory Visit: Payer: Medicaid Other | Attending: Family | Admitting: Family

## 2015-11-19 ENCOUNTER — Encounter: Payer: Self-pay | Admitting: Family

## 2015-11-19 VITALS — BP 151/75 | HR 87 | Resp 18 | Ht 63.0 in | Wt 193.0 lb

## 2015-11-19 DIAGNOSIS — I2510 Atherosclerotic heart disease of native coronary artery without angina pectoris: Secondary | ICD-10-CM | POA: Insufficient documentation

## 2015-11-19 DIAGNOSIS — I129 Hypertensive chronic kidney disease with stage 1 through stage 4 chronic kidney disease, or unspecified chronic kidney disease: Secondary | ICD-10-CM | POA: Diagnosis not present

## 2015-11-19 DIAGNOSIS — R490 Dysphonia: Secondary | ICD-10-CM

## 2015-11-19 DIAGNOSIS — I5032 Chronic diastolic (congestive) heart failure: Secondary | ICD-10-CM

## 2015-11-19 DIAGNOSIS — Z8249 Family history of ischemic heart disease and other diseases of the circulatory system: Secondary | ICD-10-CM | POA: Diagnosis not present

## 2015-11-19 DIAGNOSIS — Z823 Family history of stroke: Secondary | ICD-10-CM | POA: Diagnosis not present

## 2015-11-19 DIAGNOSIS — Z79899 Other long term (current) drug therapy: Secondary | ICD-10-CM | POA: Insufficient documentation

## 2015-11-19 DIAGNOSIS — I10 Essential (primary) hypertension: Secondary | ICD-10-CM

## 2015-11-19 DIAGNOSIS — Z992 Dependence on renal dialysis: Secondary | ICD-10-CM

## 2015-11-19 DIAGNOSIS — E119 Type 2 diabetes mellitus without complications: Secondary | ICD-10-CM | POA: Insufficient documentation

## 2015-11-19 DIAGNOSIS — E1122 Type 2 diabetes mellitus with diabetic chronic kidney disease: Secondary | ICD-10-CM

## 2015-11-19 DIAGNOSIS — Z833 Family history of diabetes mellitus: Secondary | ICD-10-CM | POA: Insufficient documentation

## 2015-11-19 DIAGNOSIS — N289 Disorder of kidney and ureter, unspecified: Secondary | ICD-10-CM | POA: Insufficient documentation

## 2015-11-19 DIAGNOSIS — I509 Heart failure, unspecified: Secondary | ICD-10-CM | POA: Diagnosis present

## 2015-11-19 DIAGNOSIS — N183 Chronic kidney disease, stage 3 (moderate): Secondary | ICD-10-CM | POA: Diagnosis not present

## 2015-11-19 DIAGNOSIS — N186 End stage renal disease: Secondary | ICD-10-CM

## 2015-11-19 NOTE — Patient Instructions (Signed)
Begin weighing daily and call for an overnight weight gain of > 2 pounds or a weekly weight gain of >5 pounds. 

## 2015-11-19 NOTE — Progress Notes (Signed)
Subjective:    Patient ID: Karen Rich, female    DOB: [birth year], 39 y.o.   MRN: [id number]  Congestive Heart Failure Presents for initial visit. The disease course has been improving. Associated symptoms include edema, fatigue (getting better) and shortness of breath (when walking long distances). Pertinent negatives include no abdominal pain, chest pain, chest pressure, orthopnea or palpitations. The symptoms have been improving. Past treatments include beta blockers and salt and fluid restriction. The treatment provided moderate relief. Compliance with prior treatments has been good. Her past medical history is significant for DM and HTN. She has one 1st degree relative with heart disease. Compliance with total regimen is 76-100%.  Hypertension This is a chronic problem. The current episode started more than 1 year ago. The problem has been waxing and waning since onset. Associated symptoms include headaches, peripheral edema and shortness of breath (when walking long distances). Pertinent negatives include no chest pain or palpitations. There are no associated agents to hypertension. Risk factors for coronary artery disease include dyslipidemia and family history. Past treatments include beta blockers, calcium channel blockers, diuretics and lifestyle changes. The current treatment provides moderate improvement. Compliance problems include exercise.  Hypertensive end-organ damage includes kidney disease and heart failure. There is no history of CVA.    Past Medical History  Diagnosis Date  . Edema   . Hyperlipidemia   . Hyperglycemia   . Fatigue   . Irregular heart beat   . Hypertension   . Diabetes mellitus without complication (HCC)   . Renal disorder   . Renal failure     stage 3  . CHF (congestive heart failure) [facility])     Past Surgical History  Procedure Laterality Date  . None    . Peripheral vascular catheterization N/A 10/31/2015    Procedure: Dialysis/Perma Catheter  Insertion;  Surgeon: Annice Needy, MD;  Location: [facility] INVASIVE CV LAB;  Service: Cardiovascular;  Laterality: N/A;    Family History  Problem Relation Age of Onset  . Hypertension Mother   . Diabetes Mother   . Hypertension Father   . CVA Father     Social History  Substance Use Topics  . Smoking status: Never Smoker   . Smokeless tobacco: Never Used  . Alcohol Use: No    No Known Allergies  Prior to Admission medications   Medication Sig Start Date End Date Taking? Authorizing Provider  amLODipine (NORVASC) 10 MG tablet Take 1 tablet (10 mg total) by mouth daily. 11/02/15  Yes Alford Highland, MD  carvedilol (COREG) 25 MG tablet Take 1 tablet (25 mg total) by mouth 2 (two) times daily with a meal. 11/02/15  Yes Alford Highland, MD  ferrous gluconate (FERGON) 324 MG tablet Take 1 tablet (324 mg total) by mouth daily with breakfast. 11/02/15  Yes Alford Highland, MD  fluticasone (FLONASE) 50 MCG/ACT nasal spray Place 2 sprays into both nostrils daily. 11/02/15  Yes Alford Highland, MD  furosemide (LASIX) 80 MG tablet On non-dialysis days 11/02/15  Yes Alford Highland, MD  glipiZIDE (GLUCOTROL XL) 2.5 MG 24 hr tablet Take 1 tablet (2.5 mg total) by mouth daily with breakfast. 11/03/15  Yes Alford Highland, MD  hydrALAZINE (APRESOLINE) 25 MG tablet Take 1 tablet (25 mg total) by mouth every 8 (eight) hours. 11/02/15  Yes Alford Highland, MD  isosorbide mononitrate (IMDUR) 60 MG 24 hr tablet Take 1 tablet (60 mg total) by mouth daily. 11/02/15  Yes Alford Highland, MD     Review of  Systems  Constitutional: Positive for appetite change (appetite up and down) and fatigue (getting better).  HENT: Positive for rhinorrhea and voice change (hoarseness since intubation in hospital). Negative for congestion and sore throat.   Eyes: Positive for visual disturbance (blurry vision). Negative for pain.  Respiratory: Positive for cough and shortness of breath (when walking long distances).  Negative for chest tightness.   Cardiovascular: Positive for leg swelling (getting better). Negative for chest pain and palpitations.  Gastrointestinal: Negative for abdominal pain and abdominal distention.  Endocrine: Negative.   Genitourinary: Positive for decreased urine volume. Negative for hematuria.  Musculoskeletal: Negative.   Skin: Negative.   Allergic/Immunologic: Negative.   Neurological: Positive for light-headedness (on occasion) and headaches. Negative for dizziness.  Hematological: Negative for adenopathy. Bruises/bleeds easily (no different).  Psychiatric/Behavioral: Positive for sleep disturbance (sleeping in recliner due to comfor). Negative for dysphoric mood. The patient is not nervous/anxious.        Objective:   Physical Exam  Constitutional: She is oriented to person, place, and time. She appears well-developed and well-nourished.  HENT:  Head: Normocephalic and atraumatic.  Eyes: Conjunctivae are normal. Right eye exhibits no discharge. Left eye exhibits no discharge.  Neck: Normal range of motion. Neck supple.  Cardiovascular: Normal rate and regular rhythm.   Pulmonary/Chest: Effort normal. She has no wheezes. She has no rales.  Abdominal: Soft. She exhibits no distension. There is no tenderness.  Musculoskeletal: She exhibits edema (both lower legs wrapped in UNA boots). She exhibits no tenderness.  Neurological: She is alert and oriented to person, place, and time.  Skin: Skin is warm and dry.  Dialysis catheter present in left upper chest.   Psychiatric: She has a normal mood and affect. Her behavior is normal. Thought content normal.  Nursing note and vitals reviewed.   BP 151/75 mmHg  Pulse 87  Resp 18  Ht 5\' 3"  (1.6 m)  Wt 193 lb (87.544 kg)  BMI 34.20 kg/m2  SpO2 98%  LMP 11/09/2015 (Exact Date)       Assessment & Plan:  1: Chronic heart failure with preserved ejection fraction- Patient presents with fatigue and shortness of breath upon  exertion although she says that both symptoms are improving. She did come to the office in a wheelchair because she said that she would have been quite short of breath and tired if she would have had to walk the distance. She does not weigh herself at home because she doesn't have any scales so a set of scales was given to her. She was instructed to weigh herself daily first thing in the morning and call for an overnight weight gain of >2 pounds or a weekly weight gain of >5 pounds. Discussed also that she might have quite a fluctuation depending on whether she is having dialysis that day or not. Does have some edema in both of her lower legs and they are currently being wrapped in UNA boots by home health nurse and changed on a weekly basis. Did encourage her to elevate her legs when she's home sitting down. She is not adding salt to her food and is already reading food labels and trying to decrease her consumption of processed foods. Discussed the importance of following a 2000mg  sodium diet and written information was given to her about that. A low sodium cookbook was also given to her. She has received her flu vaccine already this season. [facility] Pharm[facility] went in and reviewed medications with her.  2: HTN- Blood pressure  looks good today. Encouraged her to write down her blood pressures from dialysis so I can get an idea of what happens to her blood pressure when she has dialysis. Currently receiving dialysis on T, TH & Sat.  3: Diabetes- She says that her glucose was 272 this morning but she ate late yesterday because she slept a lot. Encouraged her to follow up with her PCP regarding her glucose levels. Currently taking glipizide 2.5mg  daily.  4: Hoarseness- She says that ever since she was intubated about a month ago, her voice has been hoarse where she sounds like she is whispering. She is getting concerned that it hasn't improved yet. She says that she is going to speak with her PCP about an ENT referral for  an evaluation.   Return here in 1 month or sooner for any questions/problems before then.

## 2015-11-26 ENCOUNTER — Encounter
Admission: RE | Admit: 2015-11-26 | Discharge: 2015-11-26 | Disposition: A | Discharge status: Home / Self Care | Payer: Medicaid Other | Source: Ambulatory Visit | Attending: Physician Assistant | Admitting: Physician Assistant

## 2015-11-26 DIAGNOSIS — R0602 Shortness of breath: Secondary | ICD-10-CM | POA: Insufficient documentation

## 2015-11-26 LAB — NM MYOCAR MULTI W/SPECT W/WALL MOTION / EF
CHL CUP NUCLEAR SDS: 6
CHL CUP NUCLEAR SRS: 0
CHL CUP NUCLEAR SSS: 6
CSEPHR: 51 %
LV sys vol: 54 mL
LVDIAVOL: 104 mL
NUC STRESS TID: 0.93
Peak HR: 93 {beats}/min
Rest HR: 83 {beats}/min

## 2015-11-26 MED ORDER — TECHNETIUM TC 99M SESTAMIBI - CARDIOLITE
13.0000 | Freq: Once | INTRAVENOUS | Status: AC | PRN
  Administered 2015-11-26: 09:00:00 12.96 via INTRAVENOUS

## 2015-11-26 MED ORDER — TECHNETIUM TC 99M SESTAMIBI - CARDIOLITE
30.4890 | Freq: Once | INTRAVENOUS | Status: AC | PRN
  Administered 2015-11-26: 30.489 via INTRAVENOUS

## 2015-11-26 MED ORDER — REGADENOSON 0.4 MG/5ML IV SOLN
0.4000 mg | Freq: Once | INTRAVENOUS | Status: AC
  Administered 2015-11-26: 0.4 mg via INTRAVENOUS

## 2015-11-27 ENCOUNTER — Ambulatory Visit: Payer: Medicaid Other

## 2015-12-05 ENCOUNTER — Other Ambulatory Visit: Payer: Self-pay | Admitting: Family

## 2015-12-05 MED ORDER — HYDRALAZINE HCL 25 MG PO TABS: 25.0000 mg | ORAL_TABLET | Freq: Three times a day (TID) | ORAL | Status: AC

## 2015-12-05 MED ORDER — AMLODIPINE BESYLATE 10 MG PO TABS: 10.0000 mg | ORAL_TABLET | Freq: Every day | ORAL | Status: AC

## 2015-12-05 MED ORDER — GLIPIZIDE ER 2.5 MG PO TB24: 2.5000 mg | ORAL_TABLET | Freq: Every day | ORAL | Status: AC

## 2015-12-05 MED ORDER — ISOSORBIDE MONONITRATE ER 60 MG PO TB24: 60.0000 mg | ORAL_TABLET | Freq: Every day | ORAL | Status: AC

## 2015-12-05 MED ORDER — CARVEDILOL 25 MG PO TABS: 25.0000 mg | ORAL_TABLET | Freq: Two times a day (BID) | ORAL | Status: AC

## 2015-12-17 ENCOUNTER — Ambulatory Visit: Payer: Medicaid Other | Admitting: Cardiovascular Disease

## 2015-12-19 ENCOUNTER — Encounter: Payer: Self-pay | Admitting: *Deleted

## 2015-12-20 ENCOUNTER — Encounter: Payer: Self-pay | Admitting: Cardiovascular Disease

## 2015-12-20 ENCOUNTER — Ambulatory Visit (INDEPENDENT_AMBULATORY_CARE_PROVIDER_SITE_OTHER): Payer: Medicaid Other | Admitting: Cardiovascular Disease

## 2015-12-20 VITALS — BP 162/80 | HR 83 | Ht 63.5 in | Wt 174.0 lb

## 2015-12-20 DIAGNOSIS — I10 Essential (primary) hypertension: Secondary | ICD-10-CM | POA: Diagnosis not present

## 2015-12-20 DIAGNOSIS — I5032 Chronic diastolic (congestive) heart failure: Secondary | ICD-10-CM

## 2015-12-20 NOTE — Assessment & Plan Note (Signed)
I really think that most of her presentation is related to severe proteinuria and end-stage renal disease on hemodialysis. Recent echocardiogram showed normal LV systolic function although there was grade 2 diastolic dysfunction. Recent nuclear stress test showed no evidence of ischemia with normal ejection fraction.  Continue medical therapy.

## 2015-12-20 NOTE — Assessment & Plan Note (Signed)
Blood pressure continues to be elevated. She reports that she has not been taking her after her medications regularly Due to poor appetite. I strongly advised her to take her medications as prescribed. I also discussed with her the importance of low sodium diet.

## 2015-12-20 NOTE — Patient Instructions (Signed)
Medication Instructions: Continue same medications.   Labwork: None.   Procedures/Testing: None.   Follow-Up: 6 months with Dr. Elyza Whitt.   Any Additional Special Instructions Will Be Listed Below (If Applicable).   

## 2015-12-20 NOTE — Progress Notes (Signed)
Primary care physician: Dr. Dario Guardian  HPI  This is a 40 year old female who  Is here today for follow-up visit regarding chronic diastolic heart failure.  She has known history of hypertension, diabetes since she was 40 years old,  End-stage renal disease on hemodialysis , severe proteinuria with hypoalbuminemia, hypothyroidism and hyperlipidemia.   she had a prolonged hospitalization in November with severe fluid overload. , anasarca and at least 80 pounds of weight gain. Initial echocardiogram showed mildly decreased LV systolic function in the setting of fluid overload. The patient decompensated and developed PDA at rest due to significant fluid overload. She was intubated unventilated for a few days. She was started on dialysis with subsequent gradual improvement. Repeat echocardiogram showed normalization of LV systolic function. Subsequent outpatient nuclear stress test showed no evidence of ischemia with normal ejection fraction.  She is overall doing reasonably well although she is still very deconditioned with shortness of breath. She also continues to have hoarseness which was related to her intubation. She complains of poor appetite and sleeps a lot. She denies any chest pain.  No Known Allergies   Current Outpatient Prescriptions on File Prior to Visit  Medication Sig Dispense Refill  . amLODipine (NORVASC) 10 MG tablet Take 1 tablet (10 mg total) by mouth daily. 30 tablet 5  . carvedilol (COREG) 25 MG tablet Take 1 tablet (25 mg total) by mouth 2 (two) times daily with a meal. 60 tablet 5  . ferrous gluconate (FERGON) 324 MG tablet Take 1 tablet (324 mg total) by mouth daily with breakfast. 30 tablet 0  . fluticasone (FLONASE) 50 MCG/ACT nasal spray Place 2 sprays into both nostrils daily. 1 g 2  . furosemide (LASIX) 80 MG tablet On non-dialysis days 60 tablet 0  . glipiZIDE (GLUCOTROL XL) 2.5 MG 24 hr tablet Take 1 tablet (2.5 mg total) by mouth daily with breakfast. 30 tablet 5  .  hydrALAZINE (APRESOLINE) 25 MG tablet Take 1 tablet (25 mg total) by mouth every 8 (eight) hours. 90 tablet 5  . isosorbide mononitrate (IMDUR) 60 MG 24 hr tablet Take 1 tablet (60 mg total) by mouth daily. 30 tablet 5   No current facility-administered medications on file prior to visit.     Past Medical History  Diagnosis Date  . Edema   . Hyperlipidemia   . Hyperglycemia   . Fatigue   . Irregular heart beat   . Hypertension   . Diabetes mellitus without complication (HCC)   . Renal disorder   . Renal failure     stage 3  . CHF (congestive heart failure) [facility])      Past Surgical History  Procedure Laterality Date  . None    . Peripheral vascular catheterization N/A 10/31/2015    Procedure: Dialysis/Perma Catheter Insertion;  Surgeon: Annice Needy, MD;  Location: [facility] INVASIVE CV LAB;  Service: Cardiovascular;  Laterality: N/A;  . Insertion of dialysis catheter       Family History  Problem Relation Age of Onset  . Hypertension Mother   . Diabetes Mother   . Hypertension Father   . CVA Father   . Heart failure Father      Social History   Social History  . Marital Status: Divorced    Spouse Name: N/A  . Number of Children: N/A  . Years of Education: N/A   Occupational History  . Not on file.   Social History Main Topics  . Smoking status: Never Smoker   .  Smokeless tobacco: Never Used  . Alcohol Use: No  . Drug Use: No  . Sexual Activity: Not on file   Other Topics Concern  . Not on file   Social History Narrative     ROS A 10 point review of system was performed. It is negative other than that mentioned in the history of present illness.   PHYSICAL EXAM   BP 162/80 mmHg  Pulse 83  Ht 5' 3.5" (1.613 m)  Wt 174 lb (78.926 kg)  BMI 30.34 kg/m2 Constitutional: She is oriented to person, place, and time. She appears well-developed and well-nourished. No distress.  HENT: No nasal discharge.  Head: Normocephalic and atraumatic.  Eyes: Pupils  are equal and round. No discharge.  Neck: Normal range of motion. Neck supple. No JVD present. No thyromegaly present.  Cardiovascular: Normal rate, regular rhythm, normal heart sounds. Exam reveals no gallop and no friction rub. No murmur heard.  Pulmonary/Chest: Effort normal and breath sounds normal. No stridor. No respiratory distress. She has no wheezes. She has no rales. She exhibits no tenderness.  Abdominal: Soft. Bowel sounds are normal. She exhibits no distension. There is no tenderness. There is no rebound and no guarding.  Musculoskeletal: Normal range of motion. She exhibits +1 edema and no tenderness.  Neurological: She is alert and oriented to person, place, and time. Coordination normal.  Skin: Skin is warm and dry. No rash noted. She is not diaphoretic. No erythema. No pallor.  Psychiatric: She has a normal mood and affect. Her behavior is normal. Judgment and thought content normal.     EKG:  Normal sinus rhythm with nonspecific IVCD poor R-wave progression anteriorly.  ASSESSMENT AND PLAN

## 2015-12-21 ENCOUNTER — Encounter: Payer: Self-pay | Admitting: Family

## 2015-12-21 ENCOUNTER — Ambulatory Visit: Payer: Medicaid Other | Attending: Family | Admitting: Family

## 2015-12-21 VITALS — BP 126/63 | HR 85 | Resp 20 | Ht 63.0 in | Wt 171.0 lb

## 2015-12-21 DIAGNOSIS — E119 Type 2 diabetes mellitus without complications: Secondary | ICD-10-CM | POA: Insufficient documentation

## 2015-12-21 DIAGNOSIS — Z992 Dependence on renal dialysis: Secondary | ICD-10-CM | POA: Insufficient documentation

## 2015-12-21 DIAGNOSIS — N186 End stage renal disease: Secondary | ICD-10-CM

## 2015-12-21 DIAGNOSIS — I10 Essential (primary) hypertension: Secondary | ICD-10-CM

## 2015-12-21 DIAGNOSIS — I130 Hypertensive heart and chronic kidney disease with heart failure and stage 1 through stage 4 chronic kidney disease, or unspecified chronic kidney disease: Secondary | ICD-10-CM | POA: Diagnosis not present

## 2015-12-21 DIAGNOSIS — R1310 Dysphagia, unspecified: Secondary | ICD-10-CM | POA: Insufficient documentation

## 2015-12-21 DIAGNOSIS — Z79899 Other long term (current) drug therapy: Secondary | ICD-10-CM | POA: Diagnosis not present

## 2015-12-21 DIAGNOSIS — Z7984 Long term (current) use of oral hypoglycemic drugs: Secondary | ICD-10-CM | POA: Diagnosis not present

## 2015-12-21 DIAGNOSIS — I5032 Chronic diastolic (congestive) heart failure: Secondary | ICD-10-CM | POA: Insufficient documentation

## 2015-12-21 DIAGNOSIS — Z9889 Other specified postprocedural states: Secondary | ICD-10-CM | POA: Insufficient documentation

## 2015-12-21 DIAGNOSIS — E785 Hyperlipidemia, unspecified: Secondary | ICD-10-CM | POA: Insufficient documentation

## 2015-12-21 NOTE — Progress Notes (Signed)
Subjective:    Patient ID: Karen Rich, female    DOB: 05/31/1976, 40 y.o.   MRN: [id number]  Congestive Heart Failure Presents for follow-up visit. The disease course has been improving. Associated symptoms include edema, fatigue (better) and shortness of breath (better). Pertinent negatives include no abdominal pain, chest pain, chest pressure, orthopnea or palpitations. The symptoms have been improving. Past treatments include beta blockers and salt and fluid restriction. The treatment provided moderate relief. Compliance with prior treatments has been good. Her past medical history is significant for DM and HTN. There is no history of CVA. Compliance with total regimen is 76-100%.  Hypertension This is a chronic problem. The current episode started more than 1 year ago. The problem has been waxing and waning since onset. Associated symptoms include headaches (better), peripheral edema and shortness of breath (better). Pertinent negatives include no chest pain, neck pain or palpitations. There are no associated agents to hypertension. Risk factors for coronary artery disease include diabetes mellitus, dyslipidemia and family history. Past treatments include beta blockers, diuretics, lifestyle changes and calcium channel blockers. The current treatment provides moderate improvement. There are no compliance problems.  Hypertensive end-organ damage includes kidney disease and heart failure.  Other This is a chronic (dysphagia/hoarseness) problem. The current episode started more than 1 month ago. The problem occurs daily. The problem has been unchanged. Associated symptoms include coughing (dry), fatigue (better), headaches (better) and a sore throat. Pertinent negatives include no abdominal pain, chest pain, congestion, nausea, neck pain, vomiting or weakness. The symptoms are aggravated by drinking, eating and swallowing. She has tried position changes for the symptoms. The treatment provided no  relief.   Past Medical History  Diagnosis Date  . Edema   . Hyperlipidemia   . Hyperglycemia   . Fatigue   . Irregular heart beat   . Hypertension   . Diabetes mellitus without complication (HCC)   . Renal disorder   . Renal failure     stage 3  . CHF (congestive heart failure) [facility])     Past Surgical History  Procedure Laterality Date  . None    . Peripheral vascular catheterization N/A 10/31/2015    Procedure: Dialysis/Perma Catheter Insertion;  Surgeon: Annice Needy, MD;  Location: [facility] INVASIVE CV LAB;  Service: Cardiovascular;  Laterality: N/A;  . Insertion of dialysis catheter      Family History  Problem Relation Age of Onset  . Hypertension Mother   . Diabetes Mother   . Hypertension Father   . CVA Father   . Heart failure Father     Social History  Substance Use Topics  . Smoking status: Never Smoker   . Smokeless tobacco: Never Used  . Alcohol Use: No    No Known Allergies  Prior to Admission medications   Medication Sig Start Date End Date Taking? Authorizing Provider  amLODipine (NORVASC) 10 MG tablet Take 1 tablet (10 mg total) by mouth daily. 12/05/15  Yes Delma Freeze, FNP  carvedilol (COREG) 25 MG tablet Take 1 tablet (25 mg total) by mouth 2 (two) times daily with a meal. 12/05/15  Yes Delma Freeze, FNP  ferrous gluconate (FERGON) 324 MG tablet Take 1 tablet (324 mg total) by mouth daily with breakfast. 11/02/15  Yes Alford Highland, MD  fluticasone (FLONASE) 50 MCG/ACT nasal spray Place 2 sprays into both nostrils daily. 11/02/15  Yes Alford Highland, MD  furosemide (LASIX) 80 MG tablet On non-dialysis days 11/02/15  Yes Richard Alton,  MD  glipiZIDE (GLUCOTROL XL) 2.5 MG 24 hr tablet Take 1 tablet (2.5 mg total) by mouth daily with breakfast. 12/05/15  Yes Delma Freeze, FNP  hydrALAZINE (APRESOLINE) 25 MG tablet Take 1 tablet (25 mg total) by mouth every 8 (eight) hours. 12/05/15  Yes Delma Freeze, FNP  isosorbide mononitrate (IMDUR) 60  MG 24 hr tablet Take 1 tablet (60 mg total) by mouth daily. 12/05/15  Yes Delma Freeze, FNP      Review of Systems  Constitutional: Positive for appetite change and fatigue (better).  HENT: Positive for sore throat, trouble swallowing and voice change (hoarseness). Negative for congestion and postnasal drip.   Eyes: Negative.   Respiratory: Positive for cough (dry), choking (with liquids and/or foods) and shortness of breath (better). Negative for chest tightness.   Cardiovascular: Positive for leg swelling. Negative for chest pain and palpitations.  Gastrointestinal: Negative for nausea, vomiting, abdominal pain and abdominal distention.  Endocrine: Negative.   Genitourinary: Negative.   Musculoskeletal: Negative for back pain and neck pain.  Skin: Negative.   Allergic/Immunologic: Negative.   Neurological: Positive for headaches (better). Negative for dizziness, weakness and light-headedness.  Hematological: Negative for adenopathy. Bruises/bleeds easily.  Psychiatric/Behavioral: Positive for sleep disturbance (chronically doesn't sleep well but does rest well. Sleeping on 1 pillow). Negative for dysphoric mood. The patient is not nervous/anxious.        Objective:   Physical Exam  Constitutional: She is oriented to person, place, and time. She appears well-developed and well-nourished.  HENT:  Head: Normocephalic and atraumatic.  Eyes: Conjunctivae are normal. Pupils are equal, round, and reactive to light.  Neck: Normal range of motion. Neck supple.  Cardiovascular: Normal rate and regular rhythm.   No murmur heard. Pulmonary/Chest: Effort normal. She has no wheezes. She has no rales.  Abdominal: Soft. She exhibits no distension. There is no tenderness.  Musculoskeletal: She exhibits edema (1+ pitting edema in bilateral lower legs). She exhibits no tenderness.  Neurological: She is alert and oriented to person, place, and time.  Skin: Skin is warm and dry.  Psychiatric: She  has a normal mood and affect. Her behavior is normal. Thought content normal.  Nursing note and vitals reviewed.   BP 126/63 mmHg  Pulse 85  Resp 20  Ht 5\' 3"  (1.6 m)  Wt 171 lb (77.565 kg)  BMI 30.30 kg/m2  SpO2 96%       Assessment & Plan:  1: Chronic heart failure with preserved ejection fraction- Patient presents with improvement in her fatigue and her shortness of breath. She came into the office in a wheelchair but this time walked from the waiting room into the exam room. Discussed cardiac rehab with her and she is interested so a brochure was given to her about this. She is to let me know if she'd like me to make a referral. She continues to weigh herself daily and reports a steady weight loss. By our scale, she's lost 22 pounds in the last month. Reminded to call for an overnight weight gain of >2 pounds or a weekly weight gain of >5 pounds.  Continues to have some edema in her lower legs but that is improving as well. No longer a need for unna boots. She has decreased her sodium usage although says that she uses salt on her foods about once a week. The rest of the time she uses Mrs[employer] Dash seasonings. Has been reading food labels.  2: HTN- Blood pressure looks good today.  She says that it still fluctuates due to dialysis. Would like a blood pressure cuff so that she can keep an eye on it at home. Prescription written for her to get a blood pressure cuff of her choice and one that is easy for her to use and that medicaid will pay for. 3: Chronic kidney disease on dialysis- She goes to dialysis on T, TH, Sat and she feels like that has helped her quite a bit. 4: Dysphagia- She is having trouble swallowing her food along with choking on liquids and/or foods. She also continues to be hoarse although she does sound better than the last time she was here. She does have a PCP appointment scheduled for next [facility] to get established so that the PCP can make a referral to  ENT for an evaluation. No vomiting but will choke and cough.   Return here in 3 months or sooner for any questions/problems before then.

## 2015-12-21 NOTE — Patient Instructions (Signed)
Continue weighing daily and call for an overnight weight gain of > 2 pounds or a weekly weight gain of >5 pounds. 

## 2015-12-26 ENCOUNTER — Emergency Department: Payer: Medicaid Other

## 2015-12-26 ENCOUNTER — Inpatient Hospital Stay: Payer: Medicaid Other

## 2015-12-26 ENCOUNTER — Inpatient Hospital Stay
Admission: EM | Admit: 2015-12-26 | Discharge: 2016-01-09 | DRG: 871 | Disposition: E | Payer: Medicaid Other | Attending: Internal Medicine | Admitting: Internal Medicine

## 2015-12-26 ENCOUNTER — Encounter: Payer: Self-pay | Admitting: Internal Medicine

## 2015-12-26 ENCOUNTER — Encounter: Admission: EM | Disposition: E | Payer: Medicaid Other | Source: Home / Self Care | Attending: Internal Medicine

## 2015-12-26 DIAGNOSIS — E785 Hyperlipidemia, unspecified: Secondary | ICD-10-CM | POA: Diagnosis present

## 2015-12-26 DIAGNOSIS — J9600 Acute respiratory failure, unspecified whether with hypoxia or hypercapnia: Secondary | ICD-10-CM | POA: Diagnosis not present

## 2015-12-26 DIAGNOSIS — G931 Anoxic brain damage, not elsewhere classified: Secondary | ICD-10-CM | POA: Diagnosis present

## 2015-12-26 DIAGNOSIS — A419 Sepsis, unspecified organism: Secondary | ICD-10-CM | POA: Diagnosis present

## 2015-12-26 DIAGNOSIS — E11649 Type 2 diabetes mellitus with hypoglycemia without coma: Secondary | ICD-10-CM | POA: Diagnosis not present

## 2015-12-26 DIAGNOSIS — K922 Gastrointestinal hemorrhage, unspecified: Secondary | ICD-10-CM | POA: Diagnosis present

## 2015-12-26 DIAGNOSIS — D72829 Elevated white blood cell count, unspecified: Secondary | ICD-10-CM | POA: Diagnosis present

## 2015-12-26 DIAGNOSIS — E878 Other disorders of electrolyte and fluid balance, not elsewhere classified: Secondary | ICD-10-CM | POA: Diagnosis present

## 2015-12-26 DIAGNOSIS — R6521 Severe sepsis with septic shock: Secondary | ICD-10-CM | POA: Diagnosis present

## 2015-12-26 DIAGNOSIS — N049 Nephrotic syndrome with unspecified morphologic changes: Secondary | ICD-10-CM | POA: Diagnosis present

## 2015-12-26 DIAGNOSIS — I959 Hypotension, unspecified: Secondary | ICD-10-CM | POA: Diagnosis not present

## 2015-12-26 DIAGNOSIS — N186 End stage renal disease: Secondary | ICD-10-CM | POA: Diagnosis present

## 2015-12-26 DIAGNOSIS — Z66 Do not resuscitate: Secondary | ICD-10-CM | POA: Diagnosis not present

## 2015-12-26 DIAGNOSIS — E1122 Type 2 diabetes mellitus with diabetic chronic kidney disease: Secondary | ICD-10-CM | POA: Diagnosis present

## 2015-12-26 DIAGNOSIS — G936 Cerebral edema: Secondary | ICD-10-CM | POA: Diagnosis present

## 2015-12-26 DIAGNOSIS — J9601 Acute respiratory failure with hypoxia: Secondary | ICD-10-CM | POA: Diagnosis present

## 2015-12-26 DIAGNOSIS — Z8249 Family history of ischemic heart disease and other diseases of the circulatory system: Secondary | ICD-10-CM | POA: Diagnosis not present

## 2015-12-26 DIAGNOSIS — E1165 Type 2 diabetes mellitus with hyperglycemia: Secondary | ICD-10-CM | POA: Diagnosis present

## 2015-12-26 DIAGNOSIS — Z515 Encounter for palliative care: Secondary | ICD-10-CM | POA: Diagnosis not present

## 2015-12-26 DIAGNOSIS — E8809 Other disorders of plasma-protein metabolism, not elsewhere classified: Secondary | ICD-10-CM | POA: Diagnosis present

## 2015-12-26 DIAGNOSIS — I469 Cardiac arrest, cause unspecified: Secondary | ICD-10-CM | POA: Diagnosis present

## 2015-12-26 DIAGNOSIS — I468 Cardiac arrest due to other underlying condition: Secondary | ICD-10-CM | POA: Diagnosis present

## 2015-12-26 DIAGNOSIS — E876 Hypokalemia: Secondary | ICD-10-CM | POA: Diagnosis present

## 2015-12-26 DIAGNOSIS — R404 Transient alteration of awareness: Secondary | ICD-10-CM | POA: Diagnosis not present

## 2015-12-26 DIAGNOSIS — N179 Acute kidney failure, unspecified: Secondary | ICD-10-CM | POA: Diagnosis present

## 2015-12-26 DIAGNOSIS — I132 Hypertensive heart and chronic kidney disease with heart failure and with stage 5 chronic kidney disease, or end stage renal disease: Secondary | ICD-10-CM | POA: Diagnosis present

## 2015-12-26 DIAGNOSIS — Z992 Dependence on renal dialysis: Secondary | ICD-10-CM | POA: Diagnosis not present

## 2015-12-26 DIAGNOSIS — R0600 Dyspnea, unspecified: Secondary | ICD-10-CM

## 2015-12-26 DIAGNOSIS — J189 Pneumonia, unspecified organism: Secondary | ICD-10-CM | POA: Diagnosis present

## 2015-12-26 DIAGNOSIS — Z79899 Other long term (current) drug therapy: Secondary | ICD-10-CM | POA: Diagnosis not present

## 2015-12-26 DIAGNOSIS — I5032 Chronic diastolic (congestive) heart failure: Secondary | ICD-10-CM | POA: Insufficient documentation

## 2015-12-26 DIAGNOSIS — Z823 Family history of stroke: Secondary | ICD-10-CM | POA: Diagnosis not present

## 2015-12-26 DIAGNOSIS — Z833 Family history of diabetes mellitus: Secondary | ICD-10-CM | POA: Diagnosis not present

## 2015-12-26 DIAGNOSIS — Z7984 Long term (current) use of oral hypoglycemic drugs: Secondary | ICD-10-CM | POA: Diagnosis not present

## 2015-12-26 DIAGNOSIS — R197 Diarrhea, unspecified: Secondary | ICD-10-CM | POA: Diagnosis present

## 2015-12-26 DIAGNOSIS — I5041 Acute combined systolic (congestive) and diastolic (congestive) heart failure: Secondary | ICD-10-CM | POA: Diagnosis present

## 2015-12-26 DIAGNOSIS — N2581 Secondary hyperparathyroidism of renal origin: Secondary | ICD-10-CM | POA: Diagnosis present

## 2015-12-26 DIAGNOSIS — D631 Anemia in chronic kidney disease: Secondary | ICD-10-CM | POA: Diagnosis present

## 2015-12-26 DIAGNOSIS — E039 Hypothyroidism, unspecified: Secondary | ICD-10-CM | POA: Diagnosis present

## 2015-12-26 DIAGNOSIS — R579 Shock, unspecified: Secondary | ICD-10-CM | POA: Diagnosis not present

## 2015-12-26 HISTORY — DX: Chronic diastolic (congestive) heart failure: I50.32

## 2015-12-26 HISTORY — DX: Dependence on renal dialysis: N18.6

## 2015-12-26 HISTORY — DX: End stage renal disease: Z99.2

## 2015-12-26 LAB — CBC WITH DIFFERENTIAL/PLATELET
BASOS PCT: 1 %
Basophils Absolute: 0.2 10*3/uL — ABNORMAL HIGH (ref 0–0.1)
EOS PCT: 0 %
Eosinophils Absolute: 0 10*3/uL (ref 0–0.7)
HEMATOCRIT: 28.3 % — AB (ref 35.0–47.0)
HEMOGLOBIN: 8.2 g/dL — AB (ref 12.0–16.0)
LYMPHS PCT: 8 %
Lymphs Abs: 1.5 10*3/uL (ref 1.0–3.6)
MCH: 26.7 pg (ref 26.0–34.0)
MCHC: 29.2 g/dL — ABNORMAL LOW (ref 32.0–36.0)
MCV: 91.3 fL (ref 80.0–100.0)
MONO ABS: 1 10*3/uL — AB (ref 0.2–0.9)
MONOS PCT: 5 %
NEUTROS PCT: 86 %
Neutro Abs: 16.3 10*3/uL — ABNORMAL HIGH (ref 1.4–6.5)
PLATELETS: 202 10*3/uL (ref 150–440)
RBC: 3.09 MIL/uL — AB (ref 3.80–5.20)
RDW: 19.6 % — ABNORMAL HIGH (ref 11.5–14.5)
WBC: 19 10*3/uL — AB (ref 3.6–11.0)

## 2015-12-26 LAB — BASIC METABOLIC PANEL
ANION GAP: 9 (ref 5–15)
ANION GAP: 10 (ref 5–15)
BUN: 29 mg/dL — ABNORMAL HIGH (ref 6–20)
BUN: 31 mg/dL — ABNORMAL HIGH (ref 6–20)
CO2: 23 mmol/L (ref 22–32)
CO2: 24 mmol/L (ref 22–32)
Calcium: 9.5 mg/dL (ref 8.9–10.3)
Calcium: 9.3 mg/dL (ref 8.9–10.3)
Chloride: 104 mmol/L (ref 101–111)
Chloride: 102 mmol/L (ref 101–111)
Creatinine, Ser: 4.42 mg/dL — ABNORMAL HIGH (ref 0.44–1.00)
Creatinine, Ser: 4.59 mg/dL — ABNORMAL HIGH (ref 0.44–1.00)
GFR calc Af Amer: 13 mL/min — ABNORMAL LOW (ref >60)
GFR calc Af Amer: 13 mL/min — ABNORMAL LOW (ref >60)
GFR, EST NON AFRICAN AMERICAN: 11 mL/min — AB (ref >60)
GFR, EST NON AFRICAN AMERICAN: 11 mL/min — AB (ref >60)
GLUCOSE: 200 mg/dL — AB (ref 65–99)
GLUCOSE: 262 mg/dL — AB (ref 65–99)
POTASSIUM: 2.9 mmol/L — AB (ref 3.5–5.1)
POTASSIUM: 2.8 mmol/L — AB (ref 3.5–5.1)
Sodium: 136 mmol/L (ref 135–145)
Sodium: 136 mmol/L (ref 135–145)

## 2015-12-26 LAB — SALICYLATE LEVEL: Salicylate Lvl: <4 mg/dL (ref 2.8–30.0)

## 2015-12-26 LAB — COMPREHENSIVE METABOLIC PANEL
ALK PHOS: 170 U/L — AB (ref 38–126)
ALT: 41 U/L (ref 14–54)
AST: 51 U/L — AB (ref 15–41)
Albumin: 2.6 g/dL — ABNORMAL LOW (ref 3.5–5.0)
Anion gap: 10 (ref 5–15)
BILIRUBIN TOTAL: 0.8 mg/dL (ref 0.3–1.2)
BUN: 23 mg/dL — AB (ref 6–20)
CALCIUM: 12.3 mg/dL — AB (ref 8.9–10.3)
CHLORIDE: 105 mmol/L (ref 101–111)
CO2: 22 mmol/L (ref 22–32)
CREATININE: 4.36 mg/dL — AB (ref 0.44–1.00)
GFR, EST AFRICAN AMERICAN: 13 mL/min — AB (ref >60)
GFR, EST NON AFRICAN AMERICAN: 12 mL/min — AB (ref >60)
Glucose, Bld: 149 mg/dL — ABNORMAL HIGH (ref 65–99)
Potassium: 4.5 mmol/L (ref 3.5–5.1)
Sodium: 137 mmol/L (ref 135–145)
Total Protein: 6.7 g/dL (ref 6.5–8.1)

## 2015-12-26 LAB — BRAIN NATRIURETIC PEPTIDE: B NATRIURETIC PEPTIDE 5: 938 pg/mL — AB (ref 0.0–100.0)

## 2015-12-26 LAB — GLUCOSE, CAPILLARY
GLUCOSE-CAPILLARY: 186 mg/dL — AB (ref 65–99)
Glucose-Capillary: 189 mg/dL — ABNORMAL HIGH (ref 65–99)
Glucose-Capillary: 254 mg/dL — ABNORMAL HIGH (ref 65–99)

## 2015-12-26 LAB — PROTIME-INR
INR: 1.82
INR: 1.67
PROTHROMBIN TIME: 19.7 s — AB (ref 11.4–15.0)
Prothrombin Time: 21 seconds — ABNORMAL HIGH (ref 11.4–15.0)

## 2015-12-26 LAB — APTT
aPTT: 97 seconds — ABNORMAL HIGH (ref 24–36)
aPTT: 81 seconds — ABNORMAL HIGH (ref 24–36)

## 2015-12-26 LAB — ACETAMINOPHEN LEVEL

## 2015-12-26 LAB — MAGNESIUM: Magnesium: 1.7 mg/dL (ref 1.7–2.4)

## 2015-12-26 LAB — TROPONIN I
TROPONIN I: 0.03 ng/mL (ref <0.031)
Troponin I: <0.03 ng/mL (ref <0.031)
Troponin I: 0.04 ng/mL — ABNORMAL HIGH (ref <0.031)

## 2015-12-26 LAB — C DIFFICILE QUICK SCREEN W PCR REFLEX
C DIFFICILE (CDIFF) INTERP: NEGATIVE
C Diff antigen: NEGATIVE
C Diff toxin: NEGATIVE

## 2015-12-26 LAB — ETHANOL: Alcohol, Ethyl (B): <5 mg/dL (ref <5)

## 2015-12-26 LAB — HEPARIN LEVEL (UNFRACTIONATED): Heparin Unfractionated: 0.14 IU/mL — ABNORMAL LOW (ref 0.30–0.70)

## 2015-12-26 LAB — PREGNANCY, URINE: PREG TEST UR: NEGATIVE

## 2015-12-26 SURGERY — LEFT HEART CATH AND CORONARY ANGIOGRAPHY
Anesthesia: Moderate Sedation

## 2015-12-26 MED ORDER — DEXTROSE 5 % IV SOLN
0.0000 ug/min | INTRAVENOUS | Status: DC
  Administered 2015-12-26: 2 ug/min via INTRAVENOUS
  Administered 2015-12-27: 5 ug/min via INTRAVENOUS
  Filled 2015-12-26 (×2): qty 4

## 2015-12-26 MED ORDER — PROPOFOL 1000 MG/100ML IV EMUL
20.0000 ug/kg/min | INTRAVENOUS | Status: DC
  Administered 2015-12-27 – 2015-12-28 (×4): 20 ug/kg/min via INTRAVENOUS
  Filled 2015-12-26 (×3): qty 100

## 2015-12-26 MED ORDER — VANCOMYCIN HCL IN DEXTROSE 750-5 MG/150ML-% IV SOLN
750.0000 mg | INTRAVENOUS | Status: DC
  Administered 2015-12-27: 750 mg via INTRAVENOUS
  Filled 2015-12-26 (×2): qty 150

## 2015-12-26 MED ORDER — HEPARIN (PORCINE) IN NACL 100-0.45 UNIT/ML-% IJ SOLN
900.0000 [IU]/h | INTRAMUSCULAR | Status: DC
  Administered 2015-12-26: 800 [IU]/h via INTRAVENOUS
  Filled 2015-12-26 (×2): qty 250

## 2015-12-26 MED ORDER — SODIUM CHLORIDE 0.9 % IV SOLN
INTRAVENOUS | Status: AC | PRN
  Administered 2015-12-26: 1000 mL via INTRAVENOUS

## 2015-12-26 MED ORDER — ARTIFICIAL TEARS OP OINT
1.0000 | TOPICAL_OINTMENT | Freq: Three times a day (TID) | OPHTHALMIC | Status: DC
Start: 2015-12-26 — End: 2015-12-27
  Administered 2015-12-26 – 2015-12-27 (×2): 1 via OPHTHALMIC
  Filled 2015-12-26 (×2): qty 3.5

## 2015-12-26 MED ORDER — FENTANYL CITRATE (PF) 100 MCG/2ML IJ SOLN
100.0000 ug | Freq: Once | INTRAMUSCULAR | Status: AC
  Administered 2015-12-26: 100 ug via INTRAVENOUS

## 2015-12-26 MED ORDER — POTASSIUM CHLORIDE 10 MEQ/50ML IV SOLN
10.0000 meq | INTRAVENOUS | Status: AC
  Administered 2015-12-26 (×3): 10 meq via INTRAVENOUS
  Filled 2015-12-26 (×3): qty 50

## 2015-12-26 MED ORDER — FENTANYL CITRATE (PF) 100 MCG/2ML IJ SOLN
INTRAMUSCULAR | Status: AC
  Filled 2015-12-26: qty 2

## 2015-12-26 MED ORDER — SODIUM CHLORIDE 0.9 % IV SOLN: 2000.0000 mL | Freq: Once | INTRAVENOUS | Status: DC

## 2015-12-26 MED ORDER — VANCOMYCIN HCL 10 G IV SOLR
1500.0000 mg | Freq: Once | INTRAVENOUS | Status: AC
  Administered 2015-12-26: 1500 mg via INTRAVENOUS
  Filled 2015-12-26: qty 1500

## 2015-12-26 MED ORDER — PANTOPRAZOLE SODIUM 40 MG IV SOLR
40.0000 mg | Freq: Every day | INTRAVENOUS | Status: DC
  Administered 2015-12-26: 40 mg via INTRAVENOUS
  Filled 2015-12-26: qty 40

## 2015-12-26 MED ORDER — FENTANYL BOLUS VIA INFUSION
50.0000 ug | INTRAVENOUS | Status: DC | PRN
  Filled 2015-12-26: qty 50

## 2015-12-26 MED ORDER — PIPERACILLIN-TAZOBACTAM 3.375 G IVPB 30 MIN
3.3750 g | Freq: Once | INTRAVENOUS | Status: AC
  Administered 2015-12-26: 3.375 g via INTRAVENOUS

## 2015-12-26 MED ORDER — HEPARIN BOLUS VIA INFUSION
1000.0000 [IU] | Freq: Once | INTRAVENOUS | Status: AC
  Administered 2015-12-26: 1000 [IU] via INTRAVENOUS
  Filled 2015-12-26: qty 1000

## 2015-12-26 MED ORDER — SODIUM BICARBONATE 8.4 % IV SOLN
INTRAVENOUS | Status: DC
  Administered 2015-12-26 – 2015-12-27 (×4): via INTRAVENOUS
  Filled 2015-12-26 (×9): qty 150

## 2015-12-26 MED ORDER — SODIUM BICARBONATE 8.4 % IV SOLN
INTRAVENOUS | Status: AC | PRN
  Administered 2015-12-26: 50 meq via INTRAVENOUS

## 2015-12-26 MED ORDER — PIPERACILLIN-TAZOBACTAM 3.375 G IVPB
3.3750 g | Freq: Two times a day (BID) | INTRAVENOUS | Status: DC
  Administered 2015-12-26 – 2015-12-28 (×4): 3.375 g via INTRAVENOUS
  Filled 2015-12-26 (×6): qty 50

## 2015-12-26 MED ORDER — ACETAMINOPHEN 325 MG PO TABS: 650.0000 mg | ORAL_TABLET | Freq: Four times a day (QID) | ORAL | Status: DC | PRN

## 2015-12-26 MED ORDER — FENTANYL 2500MCG IN NS 250ML (10MCG/ML) PREMIX INFUSION
25.0000 ug/h | INTRAVENOUS | Status: DC
  Administered 2015-12-26: 50 ug/h via INTRAVENOUS
  Administered 2015-12-28: 75 ug/h via INTRAVENOUS
  Administered 2015-12-28: 100 ug/h via INTRAVENOUS
  Filled 2015-12-26 (×2): qty 250

## 2015-12-26 MED ORDER — ASPIRIN 300 MG RE SUPP: 300.0000 mg | RECTAL | Status: DC

## 2015-12-26 MED ORDER — CALCIUM CHLORIDE 10 % IV SOLN
INTRAVENOUS | Status: AC | PRN
  Administered 2015-12-26 (×2): 1 g via INTRAVENOUS

## 2015-12-26 MED ORDER — MIDAZOLAM HCL 2 MG/2ML IJ SOLN
INTRAMUSCULAR | Status: AC
  Filled 2015-12-26: qty 2

## 2015-12-26 MED ORDER — BIVALIRUDIN 250 MG IV SOLR
INTRAVENOUS | Status: AC
  Filled 2015-12-26: qty 250

## 2015-12-26 MED ORDER — SODIUM CHLORIDE 0.9 % IV SOLN
1.0000 ug/kg/min | INTRAVENOUS | Status: DC
  Administered 2015-12-26: 1 ug/kg/min via INTRAVENOUS
  Administered 2015-12-28: 1.5 ug/kg/min via INTRAVENOUS
  Filled 2015-12-26 (×2): qty 20

## 2015-12-26 MED ORDER — ASPIRIN 300 MG RE SUPP
300.0000 mg | Freq: Every day | RECTAL | Status: DC
  Administered 2015-12-26: 300 mg via RECTAL
  Filled 2015-12-26: qty 1

## 2015-12-26 MED ORDER — NOREPINEPHRINE BITARTRATE 1 MG/ML IV SOLN
0.0000 ug/min | Freq: Once | INTRAVENOUS | Status: AC
  Administered 2015-12-26: 2 ug/min via INTRAVENOUS
  Filled 2015-12-26: qty 4

## 2015-12-26 MED ORDER — HEPARIN (PORCINE) IN NACL 2-0.9 UNIT/ML-% IJ SOLN
INTRAMUSCULAR | Status: AC
  Filled 2015-12-26: qty 1000

## 2015-12-26 MED ORDER — EPINEPHRINE HCL 0.1 MG/ML IJ SOSY
PREFILLED_SYRINGE | INTRAMUSCULAR | Status: AC | PRN
  Administered 2015-12-26: 1 via INTRAVENOUS

## 2015-12-26 MED ORDER — IPRATROPIUM-ALBUTEROL 0.5-2.5 (3) MG/3ML IN SOLN
3.0000 mL | Freq: Four times a day (QID) | RESPIRATORY_TRACT | Status: DC
  Administered 2015-12-26 – 2015-12-28 (×8): 3 mL via RESPIRATORY_TRACT
  Filled 2015-12-26 (×8): qty 3

## 2015-12-26 MED ORDER — CISATRACURIUM BOLUS VIA INFUSION
0.1000 mg/kg | Freq: Once | INTRAVENOUS | Status: AC
  Administered 2015-12-26: 7.5 mg via INTRAVENOUS
  Filled 2015-12-26: qty 8

## 2015-12-26 MED ORDER — SODIUM CHLORIDE 0.9 % IJ SOLN
3.0000 mL | Freq: Two times a day (BID) | INTRAMUSCULAR | Status: DC
  Administered 2015-12-26 – 2015-12-28 (×3): 3 mL via INTRAVENOUS

## 2015-12-26 MED ORDER — CISATRACURIUM BOLUS VIA INFUSION
0.0500 mg/kg | INTRAVENOUS | Status: DC | PRN
  Filled 2015-12-26: qty 4

## 2015-12-26 MED ORDER — ACETAMINOPHEN 650 MG RE SUPP
650.0000 mg | Freq: Four times a day (QID) | RECTAL | Status: DC | PRN
Start: 2015-12-26 — End: 2015-12-29

## 2015-12-26 MED ORDER — HEPARIN BOLUS VIA INFUSION
4000.0000 [IU] | Freq: Once | INTRAVENOUS | Status: AC
  Administered 2015-12-26: 4000 [IU] via INTRAVENOUS
  Filled 2015-12-26: qty 4000

## 2015-12-26 NOTE — Progress Notes (Signed)
eLink Physician-Brief Progress Note Patient Name: Karen Rich DOB: 1976-11-01 MRN: [id number]   Date of Service  12/13/2015  HPI/Events of Note  oliguric  eICU Interventions  Hypokalemia -repleted      Intervention Category Intermediate Interventions: Electrolyte abnormality - evaluation and management  Abreanna Drawdy V. 01/06/2016, 8:40 PM

## 2015-12-26 NOTE — ED Notes (Signed)
Blue top and 2 sets of blood cultures collected. Patient tolerated well. IO discontinued. Family at bedside.

## 2015-12-26 NOTE — Progress Notes (Signed)
Pt sister, Judeth Cornfield, at bedside requesting update. Pt's mother Rico Ala contacted to give permission for nursing staff to update sister.  Password setup with mother. Mother also gave approval to for staff to updated Pt's uncle Aurther Loft and [name] Judeth Cornfield.  Mothers contact information added to chart.

## 2015-12-26 NOTE — Progress Notes (Signed)
ANTICOAGULATION CONSULT NOTE - Initial Consult  Pharmacy Consult for heparin drip Indication: chest pain/ACS  No Known Allergies  Patient Measurements: Weight: 165 lb 1.6 oz (74.889 kg) Heparin Dosing Weight: 68.3 kg  Vital Signs: Temp: 91.2 F (32.9 C) (01/18 2330) Temp Source: Core (Comment) (01/18 2330) BP: 123/70 mmHg (01/18 2330) Pulse Rate: 65 (01/18 2330)  Labs:  Recent Labs  12/31/2015 1237 12/27/2015 1854 12/29/2015 1929 01/04/2016 2147  HGB 8.2*  --   --   --   HCT 28.3*  --   --   --   PLT 202  --   --   --   APTT  --   --  97* 81*  LABPROT  --   --  21.0* 19.7*  INR  --   --  1.82 1.67  HEPARINUNFRC  --   --   --  0.14*  CREATININE 4.36* 4.42*  --  4.59*  TROPONINI 0.03 <0.03 0.04*  --     Estimated Creatinine Clearance: 15.6 mL/min (by C-G formula based on Cr of 4.59).  Assessment: Pharmacy consulted to dose and monitor heparin drip in this 40 year old female presenting after cardiopulmonary arrest with PEA. Patient is a HD patient and was not taking anticoagulants prior to admission.  Heparin dosing weight = 68.3 kg Hct 8.2 Plt 202 INR & APTT baseline labs ordered  Goal of Therapy:  Heparin level 0.3-0.7 units/ml Monitor platelets by anticoagulation protocol: Yes   Plan:  First heparin level subtherapeutic and now on hypothermia protocol. Ordered small bolus (1000 units IV x 1) and increase rate to 900 units/hr. Will recheck level in 6 hours.  Carola Frost, Pharm.D., BCPS Clinical Pharmacist 12/17/2015,11:44 PM

## 2015-12-26 NOTE — Consult Note (Addendum)
[facility] Critical Care Medicine Consultation     ASSESSMENT/PLAN   40 yo female with ESRD, diastolic/systolic CHF now with PEA arrest with prolonged downtime. Initiated on hypothermia protocol.   PULMONARY  A:VDRF due to cardiac arrest.  P:   Continue vent support, will repeat CXR and blood gases.  --CXR reviewed, showed pulmonary edema.   CARDIOVASCULAR  A: Acute systolic and diastolic CHF.  Cardiogenic shock, s/p cardiac arrest.  P:  Continue levophed, fluid resuscitation.   RENAL A:  ARF.  P:   HD per nephro.   NEUROLOGIC A:  Encephalopathy/Coma secondary to cardiac arrest.  P:   Discussed with family, considering down time and PEA arrest; prognosis poor.  --Will initiate hypothermia protocol. Monitor neuro status.    GASTROINTESTINAL A:  Gi prophylaxis.  HEMATOLOGIC A:  Leukocytosis, likely stress reaction.   INFECTIOUS A:  Will monitor cultures.   Zosyn 1/18>> Vancomycin 1/18>>  ENDOCRINE A:  DM.  --monitor blood cultures.      MAJOR EVENTS/TEST RESULTS: Cardiac arrest 1/18.  Cardiac arrest 11/07     ---------------------------------------  ---------------------------------------   Name: Karen Rich MRN: [id number] DOB: 1976/04/09    ADMISSION DATE:  12/30/2015 CONSULTATION DATE:  1/18  REFERRING MD :  Dr. Hilton Sinclair.   CHIEF COMPLAINT:  Unresponsive.    HISTORY OF PRESENT ILLNESS:   The patient is unresponsive, all history of obtained from chart, staff, and patients mother and sister at bedside.  The patient is a 40 yo female with ESRD, chf. She presented to [facility] in November with CHF, at that admission she experienced  Vfib arrest on 10/15/15 and was admitted to the ICU, she did not undergo hypothermia as she remained responsive.  She is admitted to the hospital at this time PEA arrest and found by EMS.   In the field she received 7 epinephrine, 1 calcium, 1 bicarbonate, 4 mg of Narcan and 1 D50. CPR was started at 11:20 AM.  They did get pulse back, and lost it again. In the ER, she was PEA and given 2 Amps bicarbonate, 1 epinephrine and 2 calcium doses  Per the patient's mother, she was last seen 9:30 am today and had been complaining of a sore throat and fatigue when the mother left the house. She returned  at approximately 11:00am and found pt slumped over and unresponsive. Her mother administers all her medications, she does not think that she would have taken anything else, and feels that the patient was feeling hopeful and not depressed. She called EMS, CPR was initiated ar around 11:10, and it continued until she was in the ER around 12:15 per RN.  Currently the patient is on the vent, she is unresponsive, she has had a watery/mucoid BM. She is on a low dose of levophed. Her CT of the head is pending.   PAST MEDICAL HISTORY :  Past Medical History  Diagnosis Date  . Edema   . Hyperlipidemia   . Hyperglycemia   . Fatigue   . Irregular heart beat   . Hypertension   . Diabetes mellitus without complication (HCC)   . Renal disorder   . Renal failure     stage 3  . CHF (congestive heart failure) [facility])    Past Surgical History  Procedure Laterality Date  . None    . Peripheral vascular catheterization N/A 10/31/2015    Procedure: Dialysis/Perma Catheter Insertion;  Surgeon: Annice Needy, MD;  Location: [facility] INVASIVE CV LAB;  Service: Cardiovascular;  Laterality: N/A;  . Insertion of dialysis catheter     Prior to Admission medications   Medication Sig Start Date End Date Taking? Authorizing Provider  amLODipine (NORVASC) 10 MG tablet Take 1 tablet (10 mg total) by mouth daily. 12/05/15   Delma Freeze, FNP  carvedilol (COREG) 25 MG tablet Take 1 tablet (25 mg total) by mouth 2 (two) times daily with a meal. 12/05/15   Delma Freeze, FNP  ferrous gluconate (FERGON) 324 MG tablet Take 1 tablet (324 mg total) by mouth daily with breakfast. 11/02/15   Alford Highland, MD  fluticasone [facility]) 50 MCG/ACT  nasal spray Place 2 sprays into both nostrils daily. 11/02/15   Alford Highland, MD  furosemide (LASIX) 80 MG tablet On non-dialysis days 11/02/15   Alford Highland, MD  glipiZIDE (GLUCOTROL XL) 2.5 MG 24 hr tablet Take 1 tablet (2.5 mg total) by mouth daily with breakfast. 12/05/15   Delma Freeze, FNP  hydrALAZINE (APRESOLINE) 25 MG tablet Take 1 tablet (25 mg total) by mouth every 8 (eight) hours. 12/05/15   Delma Freeze, FNP  isosorbide mononitrate (IMDUR) 60 MG 24 hr tablet Take 1 tablet (60 mg total) by mouth daily. 12/05/15   Delma Freeze, FNP   No Known Allergies  FAMILY HISTORY:  Family History  Problem Relation Age of Onset  . Hypertension Mother   . Diabetes Mother   . Hypertension Father   . CVA Father   . Heart failure Father    SOCIAL HISTORY:  reports that she has never smoked. She has never used smokeless tobacco. She reports that she does not drink alcohol or use illicit drugs.  REVIEW OF SYSTEMS:   Can not be obtained due to being on the vent.   VITAL SIGNS: Temp:  [92 F (33.3 C)-92.2 F (33.4 C)] 92 F (33.3 C) (01/18 1330) Pulse Rate:  [56-58] 56 (01/18 1330) Resp:  [17-27] 17 (01/18 1330) BP: (103-111)/(61-66) 103/61 mmHg (01/18 1330) SpO2:  [100 %] 100 % (01/18 1330) Weight:  [165 lb 1.6 oz (74.889 kg)] 165 lb 1.6 oz (74.889 kg) (01/18 1329) HEMODYNAMICS:   VENTILATOR SETTINGS:   INTAKE / OUTPUT: No intake or output data in the 24 hours ending 01-25-2016 1341  Physical Examination:   VS: BP 103/61 mmHg  Pulse 56  Temp(Src) 92 F (33.3 C)  Resp 17  Wt 165 lb 1.6 oz (74.889 kg)  SpO2 100%  LMP  (LMP Unknown)  General Appearance: No distress  Neuro:without focal findings, pt initiates spontaneous breathing when circuit is disconnected. Visibile swallowing/gag on deep suctioning. No pain response.  HEENT: pupils pinpoint, NR.  Pulmonary: normal breath sounds., diaphragmatic excursion normal.No wheezing, No rales;     CardiovascularNormal  S1,S2.  No m/r/g.    Abdomen: Benign, Soft, non-tender, No masses, hepatosplenomegaly, No lymphadenopathy Renal:  No costovertebral tenderness  GU:  Not performed at this time. Endoc: No evident thyromegaly, no signs of acromegaly. Skin:   warm, no rashes, no ecchymosis  Extremities: normal, no cyanosis, clubbing, no edema, warm with normal capillary refill.    LABS: Reviewed   LABORATORY PANEL:   CBC  Recent Labs Lab 2016/01/25 1237  WBC 19.0*  HGB 8.2*  HCT 28.3*  PLT 202    Chemistries   Recent Labs Lab 01-25-16 1237  NA 137  K 4.5  CL 105  CO2 22  GLUCOSE 149*  BUN 23*  CREATININE 4.36*  CALCIUM 12.3*  AST 51*  ALT 41  ALKPHOS 170*  BILITOT 0.8    No results for input(s): GLUCAP in the last 168 hours. No results for input(s): PHART, PCO2ART, PO2ART in the last 168 hours.  Recent Labs Lab 01-23-2016 1237  AST 51*  ALT 41  ALKPHOS 170*  BILITOT 0.8  ALBUMIN 2.6*    Cardiac Enzymes No results for input(s): TROPONINI in the last 168 hours.  RADIOLOGY:  Dg Chest Portable 1 View  01-23-2016  CLINICAL DATA:  Unresponsive.  Status post intubation. EXAM: PORTABLE CHEST 1 VIEW COMPARISON:  October 20, 2015. FINDINGS: Stable cardiomegaly. Endotracheal tube is noted with distal tip 2.5 cm above the carina. No pneumothorax or pleural effusion is noted. Left internal jugular dialysis catheter is noted. Left lung is clear. Large right upper lobe airspace opacity is noted concerning for pneumonia or edema. Nasogastric tube is seen entering stomach. IMPRESSION: Endotracheal and nasogastric tubes are in grossly good position. Large right upper lobe airspace opacity is noted concerning for pneumonia or asymmetric edema. Electronically Signed   By: Lupita Raider, M.D.   On: Jan 23, 2016 13:23       --Wells Guiles, MD.  Board Certified in Internal Medicine, Pulmonary Medicine, Critical Care Medicine, and Sleep Medicine.  Pager [phone] [facility]  Santiago Glad, M.D.  Stephanie Acre, M.D.  Billy Fischer, M.D   January 23, 2016, 1:41 PM   Critical Care Attestation.  I have personally obtained a history, examined the patient, evaluated laboratory and imaging results, formulated the assessment and plan and placed orders. The Patient requires high complexity decision making for assessment and support, frequent evaluation and titration of therapies, application of advanced monitoring technologies and extensive interpretation of multiple databases. The patient has critical illness that could lead imminently to failure of 1 or more organ systems and requires the highest level of physician preparedness to intervene.  Critical Care Time devoted to patient care services described in this note is 35 minutes and is exclusive of time spent in procedures.

## 2015-12-26 NOTE — Consult Note (Signed)
Cardiology Consultation Note  Patient ID: Karen Rich, MRN: [id number], DOB/AGE: June 26, 1976 40 y.o. Admit date: 12/24/2015   Date of Consult: 01/01/2016 Primary Physician: Mosetta Pigeon, MD Primary Cardiologist: Dr. Kirke Corin, MD  Chief Complaint: Weakness Reason for Consult: Cardiac arrest  HPI: 40 year old female with history of chronic diastolic CHF, ESRD on HD, T,T,S, diabetes since age 44, severe proteinuria with hypoalbuminemia, hypothyroidism and hyperlipidemia who presented to [facility] ED on 1/18 after being found unresponsive by her mom. EMS found her in PEA.    She had a prolonged hospitalization in November with severe fluid overload, anasarca and at least 80 pounds of weight gain. Initial echocardiogram showed mildly decreased LV systolic function in the setting of fluid overload with EF of 40-45% PASP 39 mm Hg. The patient decompensated and developed PEA arrest due to significant fluid overload. She required intubation. She was started on dialysis with subsequent gradual improvement. Repeat echocardiogram showed normalization of LV systolic function, moderate LV dysfunction with CHF with elevated left-sided pressure and nl cardiac output c/w diastolic CHF. Subsequent outpatient nuclear stress test showed no evidence of ischemia with normal ejection fraction. In follow up she was overall doing reasonably well although she was still very deconditioned with shortness of breath. She also continued to have hoarseness which was related to her intubation. She complained of poor appetite and slept a lot. She denied any chest pain.  She was scheduled for a PCP OV today. Her mother was out getting groceries and the patient subsequently went unresponsive in a chair. She apparently had been having increased diarrhea for the past several days as well. EMS was called and found her in PEA arrest. ACLS was started. She received 7 epi, 2 bicarb, 1 calcium, and 4 mg of Narcan. ROSC was obtained. No narcotics  were found near the patient. She required airway management with ET tube. Per the family the patient had not complained of any chest pain, fevers, or chills leading up to this event. She had not missed any HD. She had not missed any medications. Upon the patient's arrival to [facility] they were found to have a WBC 19,000, troponin negative x 1, BNP 838 in the setting of ESRD, negative etoh, tylenol, and asa levels, albumin 2.6. ECG showed sinus 68 bpm, nonspecific st/t changes, CXR showed likely multifactorial PNA. She was started on heparin gtt, vancomycin, and Zosyn. She has been intubated as above.    Past Medical History  Diagnosis Date  . Edema   . Hyperlipidemia   . Hyperglycemia   . Fatigue   . Irregular heart beat   . Hypertension   . Diabetes mellitus without complication (HCC)   . Renal disorder   . Renal failure     stage 3  . CHF (congestive heart failure) (HCC)       Most Recent Cardiac Studies: As above   Surgical History:  Past Surgical History  Procedure Laterality Date  . None    . Peripheral vascular catheterization N/A 10/31/2015    Procedure: Dialysis/Perma Catheter Insertion;  Surgeon: Annice Needy, MD;  Location: [facility] INVASIVE CV LAB;  Service: Cardiovascular;  Laterality: N/A;  . Insertion of dialysis catheter       Home Meds: Prior to Admission medications   Medication Sig Start Date End Date Taking? Authorizing Provider  amLODipine (NORVASC) 10 MG tablet Take 1 tablet (10 mg total) by mouth daily. 12/05/15  Yes Delma Freeze, FNP  carvedilol (COREG) 25 MG tablet Take 1  tablet (25 mg total) by mouth 2 (two) times daily with a meal. 12/05/15  Yes Delma Freeze, FNP  ferrous gluconate (FERGON) 324 MG tablet Take 1 tablet (324 mg total) by mouth daily with breakfast. 11/02/15  Yes Alford Highland, MD  fluticasone (FLONASE) 50 MCG/ACT nasal spray Place 2 sprays into both nostrils daily. 11/02/15  Yes Alford Highland, MD  furosemide (LASIX) 80 MG tablet Take 80 mg  by mouth daily. Pt only takes on non-dialysis days.   Yes Historical Provider, MD  glipiZIDE (GLUCOTROL XL) 2.5 MG 24 hr tablet Take 1 tablet (2.5 mg total) by mouth daily with breakfast. 12/05/15  Yes Delma Freeze, FNP  hydrALAZINE (APRESOLINE) 25 MG tablet Take 1 tablet (25 mg total) by mouth every 8 (eight) hours. 12/05/15  Yes Delma Freeze, FNP  isosorbide mononitrate (IMDUR) 60 MG 24 hr tablet Take 1 tablet (60 mg total) by mouth daily. 12/05/15  Yes Delma Freeze, FNP    Inpatient Medications:  . artificial tears  1 application Both Eyes 3 times per day  . aspirin  300 mg Rectal Daily  . aspirin  300 mg Rectal NOW  . cisatracurium  0.1 mg/kg Intravenous Once  . fentaNYL (SUBLIMAZE) injection  100 mcg Intravenous Once  . ipratropium-albuterol  3 mL Nebulization Q6H  . pantoprazole (PROTONIX) IV  40 mg Intravenous QHS  . sodium chloride  3 mL Intravenous Q12H  . [START ON 12/27/2015] vancomycin  750 mg Intravenous Q T,Th,Sa-HD   . sodium chloride    . cisatracurium (NIMBEX) infusion    . fentaNYL infusion INTRAVENOUS    . heparin 800 Units/hr (01/07/2016 1604)  . norepinephrine (LEVOPHED) Adult infusion    . piperacillin-tazobactam    . piperacillin-tazobactam (ZOSYN)  IV    . propofol (DIPRIVAN) infusion    .  sodium bicarbonate  infusion 1000 mL 125 mL/hr at 12/11/2015 1315  . vancomycin      Allergies: No Known Allergies  Social History   Social History  . Marital Status: Divorced    Spouse Name: N/A  . Number of Children: N/A  . Years of Education: N/A   Occupational History  . Not on file.   Social History Main Topics  . Smoking status: Never Smoker   . Smokeless tobacco: Never Used  . Alcohol Use: No  . Drug Use: No  . Sexual Activity: Not on file   Other Topics Concern  . Not on file   Social History Narrative     Family History  Problem Relation Age of Onset  . Hypertension Mother   . Diabetes Mother   . Hypertension Father   . CVA Father   .  Heart failure Father      Review of Systems: Review of Systems  Unable to perform ROS: intubated    Labs:  Recent Labs  12/14/2015 1237  TROPONINI 0.03   Lab Results  Component Value Date   WBC 19.0* 12/25/2015   HGB 8.2* 12/17/2015   HCT 28.3* 12/17/2015   MCV 91.3 12/22/2015   PLT 202 12/14/2015    Recent Labs Lab 12/19/2015 1237  NA 137  K 4.5  CL 105  CO2 22  BUN 23*  CREATININE 4.36*  CALCIUM 12.3*  PROT 6.7  BILITOT 0.8  ALKPHOS 170*  ALT 41  AST 51*  GLUCOSE 149*   Lab Results  Component Value Date   TRIG 896* 10/15/2015   No results found for: DDIMER  Radiology/Studies:  Dg Chest 1 View  12/31/2015  CLINICAL DATA:  Central catheter placement EXAM: CHEST 1 VIEW COMPARISON:  Study obtained earlier in the day FINDINGS: There is a new right-sided central catheter with the tip just beyond the cavoatrial junction in the right atrium. Left-sided central catheter tip is at the cavoatrial junction. Endotracheal tube tip is 1.3 cm above the carina. Nasogastric tube tip and side port are below the diaphragm. No pneumothorax. Airspace consolidation in both upper lobes is present, more severe on the right than on the left, not significantly changed. No new opacity. Heart prominent but stable. There is apparent calcification in the left carotid artery. IMPRESSION: Tube and catheter positions as described without pneumothorax. Note that the endotracheal tube tip is 1.3 cm above the carina. It may be prudent to consider withdrawing endotracheal tube approximately 2 cm. Airspace opacity in both upper lobes is again noted. Suspect multifocal pneumonia, although pulmonary edema could present in this manner. Cardiac prominence is stable. Apparent calcification left carotid artery. Electronically Signed   By: Bretta Bang III M.D.   On: 12/21/2015 16:05   Ct Head Wo Contrast  12/27/2015  CLINICAL DATA:  Status post cardiopulmonary arrest today. History was not able to be  obtained. Initial encounter. EXAM: CT HEAD WITHOUT CONTRAST TECHNIQUE: Contiguous axial images were obtained from the base of the skull through the vertex without intravenous contrast. COMPARISON:  Head CT scan 10/18/2015. FINDINGS: The brain appears normal without hemorrhage, infarct, mass lesion, mass effect or midline shift. No hemorrhage or abnormal extra-axial fluid collection is identified. Gray-white differentiation is maintained. The calvarium is intact. Imaged paranasal sinuses and mastoid air cells are clear. IMPRESSION: Negative head CT. Electronically Signed   By: Drusilla Kanner M.D.   On: 12/18/2015 15:06   Dg Chest Portable 1 View  01/03/2016  CLINICAL DATA:  Unresponsive.  Status post intubation. EXAM: PORTABLE CHEST 1 VIEW COMPARISON:  October 20, 2015. FINDINGS: Stable cardiomegaly. Endotracheal tube is noted with distal tip 2.5 cm above the carina. No pneumothorax or pleural effusion is noted. Left internal jugular dialysis catheter is noted. Left lung is clear. Large right upper lobe airspace opacity is noted concerning for pneumonia or edema. Nasogastric tube is seen entering stomach. IMPRESSION: Endotracheal and nasogastric tubes are in grossly good position. Large right upper lobe airspace opacity is noted concerning for pneumonia or asymmetric edema. Electronically Signed   By: Lupita Raider, M.D.   On: 12/10/2015 13:23    EKG: NSR, 68 bpm, nonspecific st/t changes  Weights: Filed Weights   12/27/2015 1329  Weight: 165 lb 1.6 oz (74.889 kg)     Physical Exam: Blood pressure 117/72, pulse 54, temperature 89.5 F (31.9 C), resp. rate 19, weight 165 lb 1.6 oz (74.889 kg), SpO2 99 %. Body mass index is 29.25 kg/(m^2). General: Critically ill appearing. Head: Normocephalic, atraumatic, sclera non-icteric, no xanthomas, nares are without discharge.  Neck: Negative for carotid bruits. JVD not elevated. Lungs: Decreased breath sounds bilaterally. Intubated. Heart: RRR with S1  S2. No murmurs, rubs, or gallops appreciated. Abdomen: Soft, non-tender, non-distended with normoactive bowel sounds. No hepatomegaly. No rebound/guarding. No obvious abdominal masses. Msk:  Strength and tone appear normal for age. Extremities: No clubbing or cyanosis. No edema.   Neuro: Intubated. Psych:  Intubated.    Assessment and Plan:   1. Acute respiratory failure with hypoxia with history of cardiac arrest found to be in PEA s/p ROSC: -Currently intubated, wean as able per PCCM -Likely in the  setting of multifocal PNA, though cannot rule out acute ischemic event at this time currently -CXR shows multifocal PNA -Check echo to evaluate LV systolic function and wall motion to trend with prior studies -Troponin levels are pending, though must consider she has ESRD on HD. It is reassuring her initial level is negative in the setting of CPR and ESRD on HD -Given this was PEA must be concerned for a PE. Consider CTA PE protocol timed with her HD -Transferring up to ICU for Code ICE, per PCCM  2. Septic shock: -Likely in the setting of the above -On vancomycin and Zosyn per IM -WBC 19,000 -Blood cultures are pending -Levophed ordered by IM  3. ESRD on HD: -Volume management by Renal -Has not missed any HD sesions -Renal on board  4. Hypotension: -As above  5. Chronic diastolic CHF: -Hold HF medications in the setting of the above   SignedEula Listen, PA-C Pager: [phone] 01/08/2016, 4:56 PM

## 2015-12-26 NOTE — Progress Notes (Signed)
eLink Physician-Brief Progress Note Patient Name: Karen Rich DOB: 01/16/1976 MRN: [id number]   Date of Service  12/24/2015  HPI/Events of Note  ESRD, nephrotic syndrome Recent cardiomyopathy - recovered EF PEA arrest, being cooled Liquid stools  eICU Interventions  Check C diff Hypothermia protocol, chk Mg     Intervention Category Evaluation Type: New Patient Evaluation  Dantonio Justen V. 12/30/2015, 6:38 PM

## 2015-12-26 NOTE — Progress Notes (Signed)
CRITICAL VALUE ALERT  Critical value received:  Potassium 2.8    Calcium 9.5    Troponin 0.04  Date of notification:  11-Jan-2016   Time of notification:  2200  Critical value read back:yes   Nurse who received alert: Sande Brothers, RN   MD notified (1st page):  Elink  Time of first page: 2205    Responding MD:  Pola Corn  Time MD responded:  2205

## 2015-12-26 NOTE — Progress Notes (Signed)
Transported pt. To CT scan.

## 2015-12-26 NOTE — ED Provider Notes (Signed)
CSN: [id number]     Arrival date & time 01/06/2016  1221 History   First MD Initiated Contact with Patient 12/20/2015 1242     No chief complaint on file.    (Consider location/radiation/quality/duration/timing/severity/associated sxs/prior Treatment) The history is provided by the EMS personnel. The history is limited by the condition of the patient.  Karen Rich is a 40 y.o. female hx of HL, HTN, DM, ESRD on HD here with cardiac arrest. Patient unable to provide any history. As per EMS, patient was found to be unresponsive Route 11:20 AM. EMS got there and found her to be in PEA. ACLS initiated and patient received 7 epi, 2 bicarb, 1 calcium, and 4 mg narcan. They did get ROSC briefly but then loss pulses. No pills or needles found next to patient.   Level V caveat- cardiac arrest.   Past Medical History  Diagnosis Date  . Edema   . Hyperlipidemia   . Hyperglycemia   . Fatigue   . Irregular heart beat   . Hypertension   . Diabetes mellitus without complication (HCC)   . Renal disorder   . Renal failure     stage 3  . CHF (congestive heart failure) [facility]    Past Surgical History  Procedure Laterality Date  . None    . Peripheral vascular catheterization N/A 10/31/2015    Procedure: Dialysis/Perma Catheter Insertion;  Surgeon: Annice Needy, MD;  Location: [facility] INVASIVE CV LAB;  Service: Cardiovascular;  Laterality: N/A;  . Insertion of dialysis catheter     Family History  Problem Relation Age of Onset  . Hypertension Mother   . Diabetes Mother   . Hypertension Father   . CVA Father   . Heart failure Father    Social History  Substance Use Topics  . Smoking status: Never Smoker   . Smokeless tobacco: Never Used  . Alcohol Use: No   OB History    No data available     Review of Systems  Unable to perform ROS: Intubated  All other systems reviewed and are negative.     Allergies  Review of patient's allergies indicates no known allergies.  Home Medications    Prior to Admission medications   Medication Sig Start Date End Date Taking? Authorizing Provider  amLODipine (NORVASC) 10 MG tablet Take 1 tablet (10 mg total) by mouth daily. 12/05/15   Delma Freeze, FNP  carvedilol (COREG) 25 MG tablet Take 1 tablet (25 mg total) by mouth 2 (two) times daily with a meal. 12/05/15   Delma Freeze, FNP  ferrous gluconate (FERGON) 324 MG tablet Take 1 tablet (324 mg total) by mouth daily with breakfast. 11/02/15   Alford Highland, MD  fluticasone [facility]) 50 MCG/ACT nasal spray Place 2 sprays into both nostrils daily. 11/02/15   Alford Highland, MD  furosemide (LASIX) 80 MG tablet On non-dialysis days 11/02/15   Alford Highland, MD  glipiZIDE (GLUCOTROL XL) 2.5 MG 24 hr tablet Take 1 tablet (2.5 mg total) by mouth daily with breakfast. 12/05/15   Delma Freeze, FNP  hydrALAZINE (APRESOLINE) 25 MG tablet Take 1 tablet (25 mg total) by mouth every 8 (eight) hours. 12/05/15   Delma Freeze, FNP  isosorbide mononitrate (IMDUR) 60 MG 24 hr tablet Take 1 tablet (60 mg total) by mouth daily. 12/05/15   Delma Freeze, FNP   LMP  (LMP Unknown) Physical Exam  Constitutional:  Cardiac arrest, king airway in place   HENT:  Head: Normocephalic.  Eyes: Pupils are equal, round, and reactive to light.  Neck: Normal range of motion.  Cardiovascular:  No cardiac rhythm   Pulmonary/Chest:  Intubated, symmetrical air rise, bilateral breath sounds   Abdominal: Soft. Bowel sounds are normal.  Musculoskeletal:  Unable   Neurological:  Altered, lethargic   Skin: Skin is warm.  Psychiatric:  Unable   Nursing note and vitals reviewed.   ED Course  Procedures (including critical care time)  Cardiopulmonary Resuscitation (CPR) Procedure Note Directed/Performed by: Chaney Malling I personally directed ancillary staff and/or performed CPR in an effort to regain return of spontaneous circulation and to maintain cardiac, neuro and systemic perfusion.   CRITICAL  CARE Performed by: Silverio Lay, Jaidence Geisler   Total critical care time: 30 minutes  Critical care time was exclusive of separately billable procedures and treating other patients.  Critical care was necessary to treat or prevent imminent or life-threatening deterioration.  Critical care was time spent personally by me on the following activities: development of treatment plan with patient and/or surrogate as well as nursing, discussions with consultants, evaluation of patient's response to treatment, examination of patient, obtaining history from patient or surrogate, ordering and performing treatments and interventions, ordering and review of laboratory studies, ordering and review of radiographic studies, pulse oximetry and re-evaluation of patient's condition.  INTUBATION Performed by: Silverio Lay, Kritika Stukes  Required items: required blood products, implants, devices, and special equipment available Patient identity confirmed: provided demographic data and hospital-assigned identification number Time out: Immediately prior to procedure a "time out" was called to verify the correct patient, procedure, equipment, support staff and site/side marked as required.  Indications: cardiac arrest  Intubation method: Glidescope Laryngoscopy   Preoxygenation: BVM  Sedatives:none Paralytic: none  Tube Size: 7.5 cuffed  Post-procedure assessment: chest rise and ETCO2 monitor Breath sounds: equal and absent over the epigastrium Tube secured with: ETT holder Chest x-ray interpreted by radiologist and me.  Chest x-ray findings: endotracheal tube in appropriate position  Patient tolerated the procedure well with no immediate complications.     Labs Review Labs Reviewed  COMPREHENSIVE METABOLIC PANEL - Abnormal; Notable for the following:    Glucose, Bld 149 (*)    BUN 23 (*)    Creatinine, Ser 4.36 (*)    Calcium 12.3 (*)    Albumin 2.6 (*)    AST 51 (*)    Alkaline Phosphatase 170 (*)    GFR calc non Af  Amer 12 (*)    GFR calc Af Amer 13 (*)    All other components within normal limits  CBC WITH DIFFERENTIAL/PLATELET  BRAIN NATRIURETIC PEPTIDE  TROPONIN I  ETHANOL  SALICYLATE LEVEL  ACETAMINOPHEN LEVEL    Imaging Review No results found. I have personally reviewed and evaluated these images and lab results as part of my medical decision-making.   EKG Interpretation None       ED ECG REPORT I, Rashidi Loh, the attending physician, personally viewed and interpreted this ECG.   Date: January 18, 2016  EKG Time:   Rate: sinus  Rhythm: normal EKG, normal sinus rhythm, ST elevation in aVR  Axis: normal  Intervals:none  ST&T Change: possible STEMI   MDM   Final diagnoses:  None    Karen Rich is a 40 y.o. female here with cardiac arrest. Patient intubated by me and has bilateral breath sounds, confirmed on CXR. Patient was given several rounds of bicarb and epi and calcium in the ED and I noticed that she had wide  complex rhythm and the QRS slowly narrows. Patient was able to regain ROSC. ECG showed possible STEMI in aVR. Code STEMI activated. Dr. Darrold Junker, came and reviewed EKG and doesn't think its a STEMI so code STEMI canceled. He doesn't plan to take patient to cath. I ordered bicarb drip for hyperkalemia empirically. Will admit to ICU.     Richardean Canal, MD 2016-01-16 1322

## 2015-12-26 NOTE — Progress Notes (Signed)
ANTIBIOTIC CONSULT NOTE - INITIAL  Pharmacy Consult for vancomycin & piperacillin/tazobactam Indication: pneumonia  No Known Allergies  Patient Measurements: Weight: 165 lb 1.6 oz (74.889 kg) Adjusted Body Weight: 61 kg  Vital Signs: Temp: 92 F (33.3 C) (01/18 1330) BP: 103/61 mmHg (01/18 1330) Pulse Rate: 56 (01/18 1330)  Labs:  Recent Labs  12/17/2015 1237  WBC 19.0*  HGB 8.2*  PLT 202  CREATININE 4.36*   Estimated Creatinine Clearance: 16.5 mL/min (by C-G formula based on Cr of 4.36). No results for input(s): VANCOTROUGH, VANCOPEAK, VANCORANDOM, GENTTROUGH, GENTPEAK, GENTRANDOM, TOBRATROUGH, TOBRAPEAK, TOBRARND, AMIKACINPEAK, AMIKACINTROU, AMIKACIN in the last 72 hours.   Microbiology: No results found for this or any previous visit (from the past 720 hour(s)).  Assessment: Pharmacy consulted to dose vancomycin and piperacillin/tazobactam in this 40 year old female for pneumonia. Patient has a history of HF, HTN, ESRD on HD TTS and was brought in with cardiopulmonary arrest PEA in the field.   Patient's adjusted body weight is 61 kg.   Goal of Therapy:  Vancomycin trough level 15-20 mcg/ml  Plan:  Measure antibiotic drug levels at steady state Follow up culture results  Start piperacillin/tazobactam 3.375 g IV q 12 hours (~24 mg/kg adjusted body weight) Will give vancomycin 1500 mg loading dose x1 in the ED Order vancomycin 750 mg IV to be given with dialysis on TTS per protocol. Current HD schedule prior to admission was TTS. Pharmacy will follow up with HD schedule and vancomycin trough will need to be drawn prior to 3rd HD dose.   Pharmacy will continue to monitor, thank you for the consult.  Cindi Carbon, PharmD Clinical Pharmacist 12/16/2015,2:12 PM

## 2015-12-26 NOTE — Progress Notes (Signed)
CRITICAL VALUE ALERT  Critical value received:  Potassium 2.9  Date of notification:  27-Dec-2015   Time of notification:  1953   Critical value read back:Yes.      Nurse who received alert:  Sande Brothers, RN MD notified (1st page):  Elink  Time of first page:  1953  MD notified (2nd page):Elink  Time of second page:2039   Responding MD:  Cyril Mourning   Time MD responded: 2039

## 2015-12-26 NOTE — ED Notes (Signed)
Patient here via EMS. King airway in place, CPR in progress. IO to  Left tibia. Patient given epi at 1129, [id number], [id number]. Given calcium at 1130. Given sodium bicarb at 1214. Given narcan at 1137, 1205.

## 2015-12-26 NOTE — Progress Notes (Signed)
ANTICOAGULATION CONSULT NOTE - Initial Consult  Pharmacy Consult for heparin drip Indication: chest pain/ACS  No Known Allergies  Patient Measurements: Weight: 165 lb 1.6 oz (74.889 kg) Heparin Dosing Weight: 68.3 kg  Vital Signs: Temp: 92 F (33.3 C) (01/18 1330) BP: 103/61 mmHg (01/18 1330) Pulse Rate: 56 (01/18 1330)  Labs:  Recent Labs  12/27/2015 1237  HGB 8.2*  HCT 28.3*  PLT 202  CREATININE 4.36*  TROPONINI 0.03    Estimated Creatinine Clearance: 16.5 mL/min (by C-G formula based on Cr of 4.36).  Assessment: Pharmacy consulted to dose and monitor heparin drip in this 40 year old female presenting after cardiopulmonary arrest with PEA. Patient is a HD patient and was not taking anticoagulants prior to admission.  Heparin dosing weight = 68.3 kg Hct 8.2 Plt 202 INR & APTT baseline labs ordered  Goal of Therapy:  Heparin level 0.3-0.7 units/ml Monitor platelets by anticoagulation protocol: Yes   Plan:  Give 4000 units bolus x 1 (~58 units/kg) Start heparin infusion at 800 units/hr (~12 units/kg/hr) Check anti-Xa level in 8 hours and daily while on heparin Continue to monitor H&H and platelets  Pharmacy will continue to monitor, thank you for the consult.  Jodelle Red Drusilla Wampole 12/13/2015,2:27 PM

## 2015-12-26 NOTE — ED Notes (Signed)
Called stemi to care[facility] Doug [phone]

## 2015-12-26 NOTE — Procedures (Signed)
Central Venous Catheter Placement: Indication: Patient receiving vesicant or irritant drug.; Patient receiving intravenous therapy for longer than 5 days.; Patient has limited or no vascular access.   Consent:verbal/emergent.   Risks and benefits explained in detail including risk of infection, bleeding, respiratory failure and death..   Hand washing performed prior to starting the procedure.   Procedure: An active timeout was performed and correct patient, name, & ID confirmed.  After explaining risk and benefits, patient was positioned correctly for central venous access. Patient was prepped using strict sterile technique including chlorohexadine preps, sterile drape, sterile gown and sterile gloves.  The area was prepped, draped and anesthetized in the usual sterile manner. Patient comfort was obtained.  A triple lumen catheter was placed in right subclavian Vein using a supraclavicular approach.  There was good blood return, catheter caps were placed on lumens, catheter flushed easily, the line was secured and a sterile dressing and BIO-PATCH applied.   Ultrasound was used to visualize vasculature and guidance of needle.   Number of Attempts: 1 Complications:none Estimated Blood Loss: none Chest Radiograph indicated and ordered.  Operator: Wells Guiles, M.D.   Wells Guiles, M.D.  Pulmonary & Critical Care Medicine

## 2015-12-26 NOTE — Consult Note (Addendum)
Reason for Consult:Unresponsive Referring Physician: Renae Gloss  CC: Unresponsive  HPI: Karen Rich is an 40 y.o. female with multiple medical problems who is unable to provide any history secondary to mental status.  All history obtained from the chart.  Family not present.  It appears that the patient was found unresponsive.  Last seen normal at 0930.  EMS found the patient to be in PEA arrest. ACLS initiated and patient received 7 epi, 2 bicarb, 1 calcium, and 4 mg narcan. They did achieve ROSC briefly but patient again became pulseless.  Did not sustain ROSC for approximately one hour.  Patient now unresponsive on no sedation.  Past Medical History  Diagnosis Date  . Edema   . Hyperlipidemia   . Hyperglycemia   . Fatigue   . Irregular heart beat   . Hypertension   . Diabetes mellitus without complication (HCC)   . Renal disorder   . Renal failure     stage 3  . CHF (congestive heart failure) [facility])     Past Surgical History  Procedure Laterality Date  . None    . Peripheral vascular catheterization N/A 10/31/2015    Procedure: Dialysis/Perma Catheter Insertion;  Surgeon: Annice Needy, MD;  Location: [facility] INVASIVE CV LAB;  Service: Cardiovascular;  Laterality: N/A;  . Insertion of dialysis catheter      Family History  Problem Relation Age of Onset  . Hypertension Mother   . Diabetes Mother   . Hypertension Father   . CVA Father   . Heart failure Father     Social History:  reports that she has never smoked. She has never used smokeless tobacco. She reports that she does not drink alcohol or use illicit drugs.  No Known Allergies  Medications: I have reviewed the patient's current medications. Prior to Admission:  Prior to Admission medications   Medication Sig Start Date End Date Taking? Authorizing Provider  amLODipine (NORVASC) 10 MG tablet Take 1 tablet (10 mg total) by mouth daily. 12/05/15   Delma Freeze, FNP  carvedilol (COREG) 25 MG tablet Take 1 tablet (25  mg total) by mouth 2 (two) times daily with a meal. 12/05/15   Delma Freeze, FNP  ferrous gluconate (FERGON) 324 MG tablet Take 1 tablet (324 mg total) by mouth daily with breakfast. 11/02/15   Alford Highland, MD  fluticasone [facility]) 50 MCG/ACT nasal spray Place 2 sprays into both nostrils daily. 11/02/15   Alford Highland, MD  furosemide (LASIX) 80 MG tablet On non-dialysis days 11/02/15   Alford Highland, MD  glipiZIDE (GLUCOTROL XL) 2.5 MG 24 hr tablet Take 1 tablet (2.5 mg total) by mouth daily with breakfast. 12/05/15   Delma Freeze, FNP  hydrALAZINE (APRESOLINE) 25 MG tablet Take 1 tablet (25 mg total) by mouth every 8 (eight) hours. 12/05/15   Delma Freeze, FNP  isosorbide mononitrate (IMDUR) 60 MG 24 hr tablet Take 1 tablet (60 mg total) by mouth daily. 12/05/15   Delma Freeze, FNP    ROS: Unable to provide due to mental status  Physical Examination: Blood pressure 103/61, pulse 56, temperature 92 F (33.3 C), resp. rate 17, weight 74.889 kg (165 lb 1.6 oz), SpO2 100 %.  HEENT-  Normocephalic, no lesions, without obvious abnormality.  Normal external eye and conjunctiva.  Normal TM's bilaterally.  Normal auditory canals and external ears. Normal external nose, mucus membranes and septum.  Normal pharynx. Cardiovascular- S1, S2 normal, pulses palpable throughout   Lungs- rhonchi  noted Abdomen- soft, non-tender; bowel sounds normal; no masses,  no organomegaly Extremities- lower extremity edema Lymph-no adenopathy palpable Musculoskeletal-no joint tenderness, deformity or swelling Skin-skin dry with healing lesions in lower extremities  Neurological Examination Mental Status: Patient does not respond to verbal stimuli.  Does not respond to deep sternal rub.  Does not follow commands.  No verbalizations noted.  Cranial Nerves: II: patient does not respond confrontation bilaterally, pupils right 3 mm, left 3 mm,and unreactive bilaterally III,IV,VI: doll's response absent  bilaterally.  V,VII: corneal reflex absent bilaterally  VIII: patient does not respond to verbal stimuli IX,X: gag reflex unable to be tested due to ETT, XI: trapezius strength unable to test bilaterally XII: tongue strength unable to test Motor: Extremities flaccid throughout.  No spontaneous movement noted.  No purposeful movements noted. Sensory: Does not respond to noxious stimuli in any extremity. Deep Tendon Reflexes:  2+ in the upper extremities and absent in the lower extremities Plantars: mute bilaterally Cerebellar: Unable to perform   Laboratory Studies:   Basic Metabolic Panel:  Recent Labs Lab 12/18/2015 1237  NA 137  K 4.5  CL 105  CO2 22  GLUCOSE 149*  BUN 23*  CREATININE 4.36*  CALCIUM 12.3*    Liver Function Tests:  Recent Labs Lab 12/10/2015 1237  AST 51*  ALT 41  ALKPHOS 170*  BILITOT 0.8  PROT 6.7  ALBUMIN 2.6*   No results for input(s): LIPASE, AMYLASE in the last 168 hours. No results for input(s): AMMONIA in the last 168 hours.  CBC:  Recent Labs Lab 28-Dec-2015 1237  WBC 19.0*  NEUTROABS 16.3*  HGB 8.2*  HCT 28.3*  MCV 91.3  PLT 202    Cardiac Enzymes:  Recent Labs Lab 12/31/2015 1237  TROPONINI 0.03    BNP: Invalid input(s): POCBNP  CBG: No results for input(s): GLUCAP in the last 168 hours.  Microbiology: Results for orders placed or performed during the hospital encounter of 10/10/15  C difficile quick scan w PCR reflex     Status: None   Collection Time: 10/10/15 10:21 PM  Result Value Ref Range Status   C Diff antigen NEGATIVE NEGATIVE Final   C Diff toxin NEGATIVE NEGATIVE Final   C Diff interpretation Negative for C. difficile  Final  C difficile quick scan w PCR reflex     Status: None   Collection Time: 10/12/15 10:35 AM  Result Value Ref Range Status   C Diff antigen NEGATIVE NEGATIVE Final   C Diff toxin NEGATIVE NEGATIVE Final   C Diff interpretation Negative for C. difficile  Final  MRSA PCR Screening      Status: None   Collection Time: 10/17/15  9:02 AM  Result Value Ref Range Status   MRSA by PCR NEGATIVE NEGATIVE Final    Comment:        The GeneXpert MRSA Assay (FDA approved for NASAL specimens only), is one component of a comprehensive MRSA colonization surveillance program. It is not intended to diagnose MRSA infection nor to guide or monitor treatment for MRSA infections.   C difficile quick scan w PCR reflex     Status: None   Collection Time: 10/18/15  5:50 AM  Result Value Ref Range Status   C Diff antigen NEGATIVE NEGATIVE Final   C Diff toxin NEGATIVE NEGATIVE Final   C Diff interpretation Negative for C. difficile  Final    Coagulation Studies: No results for input(s): LABPROT, INR in the last 72 hours.  Urinalysis:  Recent Labs Lab 12/10/2015 1237  COLORURINE YELLOW*  LABSPEC 1.024  PHURINE QUANTITY NOT SUFFICIENT FOR TESTING  GLUCOSEU QUANTITY NOT SUFFICIENT  HGBUR QUANTITY NOT SUFFICIENT  BILIRUBINUR QUANTITY NOT SUFFICIENT  KETONESUR QUANTITY NOT SUFFICIENT  PROTEINUR QUANTITY NOT SUFFICIENT  NITRITE QUANTITY NOT SUFFICIENT  LEUKOCYTESUR QUANTITY NOT SUFFICIENT    Lipid Panel:     Component Value Date/Time   TRIG 896* 10/15/2015 2002    HgbA1C:  Lab Results  Component Value Date   HGBA1C 6.6* 10/11/2015    Urine Drug Screen:  No results found for: LABOPIA, COCAINSCRNUR, LABBENZ, AMPHETMU, THCU, LABBARB  Alcohol Level:  Recent Labs Lab 12/16/2015 1237  ETH <5    Imaging: Dg Chest Portable 1 View  12/22/2015  CLINICAL DATA:  Unresponsive.  Status post intubation. EXAM: PORTABLE CHEST 1 VIEW COMPARISON:  October 20, 2015. FINDINGS: Stable cardiomegaly. Endotracheal tube is noted with distal tip 2.5 cm above the carina. No pneumothorax or pleural effusion is noted. Left internal jugular dialysis catheter is noted. Left lung is clear. Large right upper lobe airspace opacity is noted concerning for pneumonia or edema. Nasogastric tube is seen  entering stomach. IMPRESSION: Endotracheal and nasogastric tubes are in grossly good position. Large right upper lobe airspace opacity is noted concerning for pneumonia or asymmetric edema. Electronically Signed   By: Lupita Raider, M.D.   On: 12/10/2015 13:23     Assessment/Plan: 40 year old female s/p PEA arrest with prolonged time until achievement of ROSC.  Currently patient unresponsive despite being on no sedation.  Multiple metabolic issues were present that required intervention.  Further work up recommended.  Recommendations: 1.  EEG STAT 2.  Head CT without contrast 3.  Will continue to follow with you 4.  Agree with continued addressing of metabolic issues and possible infection (wbc count 19)  Thana Farr, MD Neurology [phone] 12/22/2015, 2:24 PM

## 2015-12-26 NOTE — H&P (Signed)
[facility] - [place] at [facility]   PATIENT NAME: Karen Rich    MR#:  [id number]  DATE OF BIRTH:  [birth year]  DATE OF ADMISSION:  11-Jan-2016  PRIMARY CARE PHYSICIAN: Sherrie Mustache, MD   REQUESTING/REFERRING PHYSICIAN: Chaney Malling  CHIEF COMPLAINT:   Chief Complaint  Patient presents with  . Cardiac Arrest    HISTORY OF PRESENT ILLNESS:  Karen Rich  is a 40 y.o. female with a known history of CHF, hypertension, end-stage renal disease on dialysis, upper respiratory stridor with prolonged intubation on last admission. She presents with a cardiopulmonary arrest PEA and in the field. The last time she was known well was 9:30 AM. In the field she received 7 epinephrine, 1 calcium, 1 bicarbonate, 4 mg of Narcan and 1 D50. CPR was started at 11:20 AM. They did get pulse back, and lost it again. In the ER, she was PEA and given 2 Amps bicarbonate, 1 epinephrine and 2 calcium doses. Hospitalist services were contacted for further evaluation. Patient unable to give any history at the time. Spoke with sister and mother at the bedside.  PAST MEDICAL HISTORY:   Past Medical History  Diagnosis Date  . Edema   . Hyperlipidemia   . Hyperglycemia   . Fatigue   . Irregular heart beat   . Hypertension   . Diabetes mellitus without complication (HCC)   . Renal disorder   . Renal failure     stage 3  . CHF (congestive heart failure) (HCC)     PAST SURGICAL HISTORY:   Past Surgical History  Procedure Laterality Date  . None    . Peripheral vascular catheterization N/A 10/31/2015    Procedure: Dialysis/Perma Catheter Insertion;  Surgeon: Annice Needy, MD;  Location: [facility] INVASIVE CV LAB;  Service: Cardiovascular;  Laterality: N/A;  . Insertion of dialysis catheter      SOCIAL HISTORY:   Social History  Substance Use Topics  . Smoking status: Never Smoker   . Smokeless tobacco: Never Used  . Alcohol Use: No    FAMILY HISTORY:   Family History   Problem Relation Age of Onset  . Hypertension Mother   . Diabetes Mother   . Hypertension Father   . CVA Father   . Heart failure Father     DRUG ALLERGIES:  No Known Allergies  REVIEW OF SYSTEMS:  Unable to obtain secondary to being unresponsive.  MEDICATIONS AT HOME:   Prior to Admission medications   Medication Sig Start Date End Date Taking? Authorizing Provider  amLODipine (NORVASC) 10 MG tablet Take 1 tablet (10 mg total) by mouth daily. 12/05/15   Delma Freeze, FNP  carvedilol (COREG) 25 MG tablet Take 1 tablet (25 mg total) by mouth 2 (two) times daily with a meal. 12/05/15   Delma Freeze, FNP  ferrous gluconate (FERGON) 324 MG tablet Take 1 tablet (324 mg total) by mouth daily with breakfast. 11/02/15   Alford Highland, MD  fluticasone [facility]) 50 MCG/ACT nasal spray Place 2 sprays into both nostrils daily. 11/02/15   Alford Highland, MD  furosemide (LASIX) 80 MG tablet On non-dialysis days 11/02/15   Alford Highland, MD  glipiZIDE (GLUCOTROL XL) 2.5 MG 24 hr tablet Take 1 tablet (2.5 mg total) by mouth daily with breakfast. 12/05/15   Delma Freeze, FNP  hydrALAZINE (APRESOLINE) 25 MG tablet Take 1 tablet (25 mg total) by mouth every 8 (eight) hours. 12/05/15   Delma Freeze, FNP  isosorbide  mononitrate (IMDUR) 60 MG 24 hr tablet Take 1 tablet (60 mg total) by mouth daily. 12/05/15   Delma Freeze, FNP      VITAL SIGNS:  Blood pressure 103/61, pulse 56, temperature 92 F (33.3 C), resp. rate 17, weight 74.889 kg (165 lb 1.6 oz), SpO2 100 %.  PHYSICAL EXAMINATION:  GENERAL:  40 y.o.-year-old patient lying in the bed unresponsive  EYES: Pupils 3 mm and fixed. No scleral icterus.  HEENT: Head atraumatic, normocephalic. nasopharynx clear.  NECK:  Supple, no jugular venous distention. No thyroid enlargement, no tenderness.  LUNGS: Decreased breath sounds bilaterally, rhonchi throughout entire lung field. No use of accessory muscles of respiration.   CARDIOVASCULAR: S1, S2 normal. 2/6 systolic murmur, no rubs, or gallops.  ABDOMEN: Soft, nontender, nondistended. Bowel sounds present. No organomegaly or mass.  EXTREMITIES: 3+ edema, no cyanosis, or clubbing.  NEUROLOGIC: Unresponsive to painful stimuli. Pupils 3 mm and fixed. PSYCHIATRIC: Unresponsive to painful stimuli.  SKIN: Healing ulcer right lower extremity   LABORATORY PANEL:   CBC  Recent Labs Lab 2015-12-31 1237  WBC 19.0*  HGB 8.2*  HCT 28.3*  PLT 202   ------------------------------------------------------------------------------------------------------------------  Chemistries   Recent Labs Lab 12/31/15 1237  NA 137  K 4.5  CL 105  CO2 22  GLUCOSE 149*  BUN 23*  CREATININE 4.36*  CALCIUM 12.3*  AST 51*  ALT 41  ALKPHOS 170*  BILITOT 0.8   ------------------------------------------------------------------------------------------------------------------    RADIOLOGY:  Dg Chest Portable 1 View  2015-12-31  CLINICAL DATA:  Unresponsive.  Status post intubation. EXAM: PORTABLE CHEST 1 VIEW COMPARISON:  October 20, 2015. FINDINGS: Stable cardiomegaly. Endotracheal tube is noted with distal tip 2.5 cm above the carina. No pneumothorax or pleural effusion is noted. Left internal jugular dialysis catheter is noted. Left lung is clear. Large right upper lobe airspace opacity is noted concerning for pneumonia or edema. Nasogastric tube is seen entering stomach. IMPRESSION: Endotracheal and nasogastric tubes are in grossly good position. Large right upper lobe airspace opacity is noted concerning for pneumonia or asymmetric edema. Electronically Signed   By: Lupita Raider, M.D.   On: Dec 31, 2015 13:23    EKG:   Sinus 68 bpm left bundle branch block   IMPRESSION AND PLAN:   1. PEA cardiopulmonary arrest. Unclear etiology. Patient on levophed drip and bicarbonate drip. Critical care consult for ice protocol. Cardiology consultation. ER physician called STEMI  protocol and cardiology at the time did not want to do any procedure. I will get cardiac enzymes. Patient empirically put on heparin drip and given rectal aspirin.  2. Clinical sepsis, pneumonia seen on chest x-ray, leukocytosis and tachycardia. Start Zosyn and vancomycin and obtain blood cultures.  3. Acute encephalopathy - no response to painful stimuli. Get a CAT scan of the head. 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 Order an EEG.   4. End-stage renal disease on dialysis Tuesday Thursday and Saturday. Nephrology consultation 5. Hypotension on Levophed drip 6. Type 2 diabetes- sliding-scale 7. History of stridor was then a follow-up with ENT as outpatient 8. History of congestive heart failure  All the records are reviewed and case discussed with ED provider. Management plans discussed with the patient, family and they are in agreement.  CODE STATUS: Full code  TOTAL TIME TAKING CARE OF THIS PATIENT: 60  minutes.    Alford Highland M.D on Dec 31, 2015 at 1:32 PM  Between 7am to 6pm - Pager - [phone]  After 6pm call admission pager [phone]  [facility]  Office  [phone]  CC: Primary care physician; Sherrie Mustache, MD

## 2015-12-27 ENCOUNTER — Inpatient Hospital Stay: Payer: Medicaid Other

## 2015-12-27 ENCOUNTER — Inpatient Hospital Stay (HOSPITAL_COMMUNITY)
Admit: 2015-12-27 | Discharge: 2015-12-27 | Disposition: A | Discharge status: Home / Self Care | Payer: Medicaid Other | Attending: Physician Assistant | Admitting: Physician Assistant

## 2015-12-27 DIAGNOSIS — G931 Anoxic brain damage, not elsewhere classified: Secondary | ICD-10-CM | POA: Insufficient documentation

## 2015-12-27 DIAGNOSIS — N186 End stage renal disease: Secondary | ICD-10-CM

## 2015-12-27 DIAGNOSIS — J9600 Acute respiratory failure, unspecified whether with hypoxia or hypercapnia: Secondary | ICD-10-CM

## 2015-12-27 DIAGNOSIS — J189 Pneumonia, unspecified organism: Secondary | ICD-10-CM

## 2015-12-27 DIAGNOSIS — Z992 Dependence on renal dialysis: Secondary | ICD-10-CM

## 2015-12-27 DIAGNOSIS — I5032 Chronic diastolic (congestive) heart failure: Secondary | ICD-10-CM

## 2015-12-27 LAB — GASTROINTESTINAL PANEL BY PCR, STOOL (REPLACES STOOL CULTURE)
ADENOVIRUS F40/41: NOT DETECTED
ASTROVIRUS: NOT DETECTED
CAMPYLOBACTER SPECIES: NOT DETECTED
CRYPTOSPORIDIUM: NOT DETECTED
CYCLOSPORA CAYETANENSIS: NOT DETECTED
E. coli O157: NOT DETECTED
ENTEROAGGREGATIVE E COLI (EAEC): NOT DETECTED
ENTEROPATHOGENIC E COLI (EPEC): NOT DETECTED
ENTEROTOXIGENIC E COLI (ETEC): NOT DETECTED
Entamoeba histolytica: NOT DETECTED
GIARDIA LAMBLIA: NOT DETECTED
Norovirus GI/GII: NOT DETECTED
Plesimonas shigelloides: NOT DETECTED
Rotavirus A: NOT DETECTED
Salmonella species: NOT DETECTED
Sapovirus (I, II, IV, and V): NOT DETECTED
Shiga like toxin producing E coli (STEC): NOT DETECTED
Shigella/Enteroinvasive E coli (EIEC): NOT DETECTED
VIBRIO CHOLERAE: NOT DETECTED
VIBRIO SPECIES: NOT DETECTED
YERSINIA ENTEROCOLITICA: NOT DETECTED

## 2015-12-27 LAB — CBC
HCT: 23.7 % — ABNORMAL LOW (ref 35.0–47.0)
HCT: 22.5 % — ABNORMAL LOW (ref 35.0–47.0)
HEMOGLOBIN: 7.2 g/dL — AB (ref 12.0–16.0)
Hemoglobin: 7.5 g/dL — ABNORMAL LOW (ref 12.0–16.0)
MCH: 26.6 pg (ref 26.0–34.0)
MCH: 26.2 pg (ref 26.0–34.0)
MCHC: 31.6 g/dL — AB (ref 32.0–36.0)
MCHC: 31.8 g/dL — AB (ref 32.0–36.0)
MCV: 84.2 fL (ref 80.0–100.0)
MCV: 82.3 fL (ref 80.0–100.0)
PLATELETS: 187 10*3/uL (ref 150–440)
PLATELETS: 188 10*3/uL (ref 150–440)
RBC: 2.81 MIL/uL — AB (ref 3.80–5.20)
RBC: 2.73 MIL/uL — AB (ref 3.80–5.20)
RDW: 19 % — ABNORMAL HIGH (ref 11.5–14.5)
RDW: 19.3 % — ABNORMAL HIGH (ref 11.5–14.5)
WBC: 13.1 10*3/uL — ABNORMAL HIGH (ref 3.6–11.0)
WBC: 10.9 10*3/uL (ref 3.6–11.0)

## 2015-12-27 LAB — TROPONIN I
Troponin I: <0.03 ng/mL (ref <0.031)
Troponin I: 0.03 ng/mL (ref <0.031)
Troponin I: 0.03 ng/mL (ref <0.031)
Troponin I: 0.04 ng/mL — ABNORMAL HIGH (ref <0.031)
Troponin I: 0.03 ng/mL (ref <0.031)

## 2015-12-27 LAB — BASIC METABOLIC PANEL
ANION GAP: 10 (ref 5–15)
ANION GAP: 8 (ref 5–15)
ANION GAP: 8 (ref 5–15)
ANION GAP: 9 (ref 5–15)
ANION GAP: 9 (ref 5–15)
ANION GAP: 9 (ref 5–15)
BUN: 31 mg/dL — ABNORMAL HIGH (ref 6–20)
BUN: 33 mg/dL — ABNORMAL HIGH (ref 6–20)
BUN: 34 mg/dL — ABNORMAL HIGH (ref 6–20)
BUN: 34 mg/dL — ABNORMAL HIGH (ref 6–20)
BUN: 36 mg/dL — ABNORMAL HIGH (ref 6–20)
BUN: 36 mg/dL — ABNORMAL HIGH (ref 6–20)
CALCIUM: 8.3 mg/dL — AB (ref 8.9–10.3)
CALCIUM: 8.5 mg/dL — AB (ref 8.9–10.3)
CALCIUM: 8.8 mg/dL — AB (ref 8.9–10.3)
CALCIUM: 9 mg/dL (ref 8.9–10.3)
CALCIUM: 9.1 mg/dL (ref 8.9–10.3)
CALCIUM: 9.3 mg/dL (ref 8.9–10.3)
CHLORIDE: 101 mmol/L (ref 101–111)
CHLORIDE: 101 mmol/L (ref 101–111)
CHLORIDE: 101 mmol/L (ref 101–111)
CHLORIDE: 99 mmol/L — AB (ref 101–111)
CO2: 24 mmol/L (ref 22–32)
CO2: 26 mmol/L (ref 22–32)
CO2: 27 mmol/L (ref 22–32)
CO2: 28 mmol/L (ref 22–32)
CO2: 29 mmol/L (ref 22–32)
CO2: 32 mmol/L (ref 22–32)
Chloride: 98 mmol/L — ABNORMAL LOW (ref 101–111)
Chloride: 97 mmol/L — ABNORMAL LOW (ref 101–111)
Creatinine, Ser: 4.57 mg/dL — ABNORMAL HIGH (ref 0.44–1.00)
Creatinine, Ser: 4.59 mg/dL — ABNORMAL HIGH (ref 0.44–1.00)
Creatinine, Ser: 4.61 mg/dL — ABNORMAL HIGH (ref 0.44–1.00)
Creatinine, Ser: 4.65 mg/dL — ABNORMAL HIGH (ref 0.44–1.00)
Creatinine, Ser: 4.71 mg/dL — ABNORMAL HIGH (ref 0.44–1.00)
Creatinine, Ser: 4.76 mg/dL — ABNORMAL HIGH (ref 0.44–1.00)
GFR calc Af Amer: 12 mL/min — ABNORMAL LOW (ref >60)
GFR calc non Af Amer: 10 mL/min — ABNORMAL LOW (ref >60)
GFR calc non Af Amer: 11 mL/min — ABNORMAL LOW (ref >60)
GFR calc non Af Amer: 11 mL/min — ABNORMAL LOW (ref >60)
GFR calc non Af Amer: 11 mL/min — ABNORMAL LOW (ref >60)
GFR calc non Af Amer: 11 mL/min — ABNORMAL LOW (ref >60)
GFR, EST AFRICAN AMERICAN: 12 mL/min — AB (ref >60)
GFR, EST AFRICAN AMERICAN: 12 mL/min — AB (ref >60)
GFR, EST AFRICAN AMERICAN: 13 mL/min — AB (ref >60)
GFR, EST AFRICAN AMERICAN: 13 mL/min — AB (ref >60)
GFR, EST AFRICAN AMERICAN: 13 mL/min — AB (ref >60)
GFR, EST NON AFRICAN AMERICAN: 11 mL/min — AB (ref >60)
GLUCOSE: 284 mg/dL — AB (ref 65–99)
GLUCOSE: 217 mg/dL — AB (ref 65–99)
GLUCOSE: 154 mg/dL — AB (ref 65–99)
GLUCOSE: 107 mg/dL — AB (ref 65–99)
Glucose, Bld: 292 mg/dL — ABNORMAL HIGH (ref 65–99)
Glucose, Bld: 309 mg/dL — ABNORMAL HIGH (ref 65–99)
POTASSIUM: 2.9 mmol/L — AB (ref 3.5–5.1)
POTASSIUM: 3 mmol/L — AB (ref 3.5–5.1)
POTASSIUM: 3.2 mmol/L — AB (ref 3.5–5.1)
POTASSIUM: 3.3 mmol/L — AB (ref 3.5–5.1)
Potassium: 2.9 mmol/L — CL (ref 3.5–5.1)
Potassium: 3.1 mmol/L — ABNORMAL LOW (ref 3.5–5.1)
Sodium: 135 mmol/L (ref 135–145)
Sodium: 135 mmol/L (ref 135–145)
Sodium: 135 mmol/L (ref 135–145)
Sodium: 136 mmol/L (ref 135–145)
Sodium: 136 mmol/L (ref 135–145)
Sodium: 139 mmol/L (ref 135–145)

## 2015-12-27 LAB — GLUCOSE, CAPILLARY
GLUCOSE-CAPILLARY: 192 mg/dL — AB (ref 65–99)
GLUCOSE-CAPILLARY: 237 mg/dL — AB (ref 65–99)
GLUCOSE-CAPILLARY: 248 mg/dL — AB (ref 65–99)
GLUCOSE-CAPILLARY: 264 mg/dL — AB (ref 65–99)
GLUCOSE-CAPILLARY: 275 mg/dL — AB (ref 65–99)
GLUCOSE-CAPILLARY: 286 mg/dL — AB (ref 65–99)
Glucose-Capillary: 267 mg/dL — ABNORMAL HIGH (ref 65–99)
Glucose-Capillary: 238 mg/dL — ABNORMAL HIGH (ref 65–99)
Glucose-Capillary: 145 mg/dL — ABNORMAL HIGH (ref 65–99)
Glucose-Capillary: 128 mg/dL — ABNORMAL HIGH (ref 65–99)

## 2015-12-27 LAB — MAGNESIUM: MAGNESIUM: 1.7 mg/dL (ref 1.7–2.4)

## 2015-12-27 LAB — URINALYSIS COMPLETE WITH MICROSCOPIC (ARMC ONLY): Specific Gravity, Urine: 1.038 — ABNORMAL HIGH (ref 1.005–1.030)

## 2015-12-27 LAB — PROTIME-INR
INR: 1.59
INR: 1.61
Prothrombin Time: 19 seconds — ABNORMAL HIGH (ref 11.4–15.0)
Prothrombin Time: 19.2 seconds — ABNORMAL HIGH (ref 11.4–15.0)

## 2015-12-27 LAB — HEMOGLOBIN: HEMOGLOBIN: 7.1 g/dL — AB (ref 12.0–16.0)

## 2015-12-27 LAB — BLOOD GAS, ARTERIAL
ALLENS TEST (PASS/FAIL): POSITIVE — AB
Acid-Base Excess: 5.1 mmol/L — ABNORMAL HIGH (ref 0.0–3.0)
Bicarbonate: 29 mEq/L — ABNORMAL HIGH (ref 21.0–28.0)
FIO2: 0.45
LHR: 14 {breaths}/min
O2 Saturation: 98.8 %
PEEP: 5 cmH2O
Patient temperature: 37
VT: 500 mL
pCO2 arterial: 39 mmHg (ref 32.0–48.0)
pH, Arterial: 7.48 — ABNORMAL HIGH (ref 7.350–7.450)
pO2, Arterial: 117 mmHg — ABNORMAL HIGH (ref 83.0–108.0)

## 2015-12-27 LAB — APTT
APTT: 113 s — AB (ref 24–36)
aPTT: 41 seconds — ABNORMAL HIGH (ref 24–36)

## 2015-12-27 LAB — MRSA PCR SCREENING: MRSA BY PCR: NEGATIVE

## 2015-12-27 LAB — HEPARIN LEVEL (UNFRACTIONATED)

## 2015-12-27 LAB — PHOSPHORUS: Phosphorus: 3.5 mg/dL (ref 2.5–4.6)

## 2015-12-27 MED ORDER — ANTISEPTIC ORAL RINSE SOLUTION (CORINZ)
7.0000 mL | OROMUCOSAL | Status: DC
  Administered 2015-12-27 – 2015-12-28 (×14): 7 mL via OROMUCOSAL
  Filled 2015-12-27 (×22): qty 7

## 2015-12-27 MED ORDER — LUBRIFRESH P.M. OP OINT
TOPICAL_OINTMENT | Freq: Three times a day (TID) | OPHTHALMIC | Status: DC
  Administered 2015-12-27 – 2015-12-28 (×4): via OPHTHALMIC
  Filled 2015-12-27 (×2): qty 3.5

## 2015-12-27 MED ORDER — POTASSIUM CHLORIDE 10 MEQ/50ML IV SOLN
10.0000 meq | INTRAVENOUS | Status: AC
  Administered 2015-12-27 (×4): 10 meq via INTRAVENOUS
  Filled 2015-12-27 (×4): qty 50

## 2015-12-27 MED ORDER — INSULIN ASPART 100 UNIT/ML ~~LOC~~ SOLN
0.0000 [IU] | SUBCUTANEOUS | Status: DC
  Administered 2015-12-27 (×2): 2 [IU] via SUBCUTANEOUS
  Administered 2015-12-27: 8 [IU] via SUBCUTANEOUS
  Administered 2015-12-27: 3 [IU] via SUBCUTANEOUS
  Administered 2015-12-27: 8 [IU] via SUBCUTANEOUS
  Administered 2015-12-27: 5 [IU] via SUBCUTANEOUS
  Filled 2015-12-27: qty 8
  Filled 2015-12-27: qty 2
  Filled 2015-12-27: qty 5
  Filled 2015-12-27: qty 3
  Filled 2015-12-27: qty 2
  Filled 2015-12-27: qty 8

## 2015-12-27 MED ORDER — PANTOPRAZOLE SODIUM 40 MG IV SOLR
40.0000 mg | Freq: Two times a day (BID) | INTRAVENOUS | Status: DC
  Administered 2015-12-27 – 2015-12-28 (×2): 40 mg via INTRAVENOUS
  Filled 2015-12-27 (×2): qty 40

## 2015-12-27 MED ORDER — SODIUM CHLORIDE 0.9 % IJ SOLN: 10.0000 mL | INTRAMUSCULAR | Status: DC | PRN

## 2015-12-27 MED ORDER — INSULIN GLARGINE 100 UNIT/ML ~~LOC~~ SOLN
8.0000 [IU] | Freq: Every day | SUBCUTANEOUS | Status: DC
  Administered 2015-12-27: 8 [IU] via SUBCUTANEOUS
  Filled 2015-12-27 (×2): qty 0.08

## 2015-12-27 MED ORDER — SODIUM CHLORIDE 0.9 % IJ SOLN
10.0000 mL | Freq: Two times a day (BID) | INTRAMUSCULAR | Status: DC
  Administered 2015-12-27 – 2015-12-28 (×2): 10 mL

## 2015-12-27 MED ORDER — POTASSIUM CHLORIDE 10 MEQ/100ML IV SOLN
10.0000 meq | INTRAVENOUS | Status: AC
  Administered 2015-12-27 (×4): 10 meq via INTRAVENOUS
  Filled 2015-12-27 (×4): qty 100

## 2015-12-27 MED ORDER — CHLORHEXIDINE GLUCONATE 0.12% ORAL RINSE (MEDLINE KIT)
15.0000 mL | Freq: Two times a day (BID) | OROMUCOSAL | Status: DC
  Administered 2015-12-27 – 2015-12-28 (×3): 15 mL via OROMUCOSAL
  Filled 2015-12-27 (×5): qty 15

## 2015-12-27 MED FILL — Medication: Qty: 1 | Status: AC

## 2015-12-27 NOTE — Progress Notes (Signed)
Critical lab received of Troponin 0.04 for Nurse Staci. Reported Troponin of 0.04 in person with readback.

## 2015-12-27 NOTE — Progress Notes (Signed)
[facility] - [place] at [facility]   PATIENT NAME: Karen Rich    MR#:  [id number]  DATE OF BIRTH:  12-06-1976  SUBJECTIVE:  CHIEF COMPLAINT:   Chief Complaint  Patient presents with  . Cardiac Arrest  on vent, sedation and hypothermia.  REVIEW OF SYSTEMS:  Unable to get ROS.  DRUG ALLERGIES:  No Known Allergies  VITALS:  Blood pressure 107/64, pulse 60, temperature 91.4 F (33 C), temperature source Core (Comment), resp. rate 15, weight 74.889 kg (165 lb 1.6 oz), SpO2 100 %.  PHYSICAL EXAMINATION:  GENERAL:  40 y.o.-year-old patient lying in the bed with intubation on vent.  EYES: Pupils equal, pinpoint, not reactive to light. No scleral icterus.  HEENT: Head atraumatic, normocephalic.  NECK:  Supple, no jugular venous distention. No thyroid enlargement, no tenderness.  LUNGS: Normal breath sounds bilaterally, no wheezing, but has crakcles and rhonchi.   CARDIOVASCULAR: S1, S2 normal. No murmurs, rubs, or gallops.  ABDOMEN: Soft, nondistended. Bowel sounds present. No organomegaly or mass.  EXTREMITIES: No pedal edema, cyanosis, or clubbing.  NEUROLOGIC: unable to exam. PSYCHIATRIC: The patient is on vent and sedation. SKIN: No obvious rash, lesion, or ulcer.    LABORATORY PANEL:   CBC  Recent Labs Lab 12/27/15 1152  WBC 10.9  HGB 7.2*  HCT 22.5*  PLT 188   ------------------------------------------------------------------------------------------------------------------  Chemistries   Recent Labs Lab 12/24/2015 1237  12/27/15 0854 12/27/15 1141  NA 137  < >  --  135  K 4.5  < >  --  2.9*  CL 105  < >  --  98*  CO2 22  < >  --  28  GLUCOSE 149*  < >  --  217*  BUN 23*  < >  --  34*  CREATININE 4.36*  < >  --  4.76*  CALCIUM 12.3*  < >  --  8.8*  MG  --   < > 1.7  --   AST 51*  --   --   --   ALT 41  --   --   --   ALKPHOS 170*  --   --   --   BILITOT 0.8  --   --   --   < > = values in this interval not  displayed. ------------------------------------------------------------------------------------------------------------------  Cardiac Enzymes  Recent Labs Lab 12/27/15 0854  TROPONINI 0.03   ------------------------------------------------------------------------------------------------------------------  RADIOLOGY:  Dg Chest 1 View  12/25/2015  CLINICAL DATA:  Central catheter placement EXAM: CHEST 1 VIEW COMPARISON:  Study obtained earlier in the day FINDINGS: There is a new right-sided central catheter with the tip just beyond the cavoatrial junction in the right atrium. Left-sided central catheter tip is at the cavoatrial junction. Endotracheal tube tip is 1.3 cm above the carina. Nasogastric tube tip and side port are below the diaphragm. No pneumothorax. Airspace consolidation in both upper lobes is present, more severe on the right than on the left, not significantly changed. No new opacity. Heart prominent but stable. There is apparent calcification in the left carotid artery. IMPRESSION: Tube and catheter positions as described without pneumothorax. Note that the endotracheal tube tip is 1.3 cm above the carina. It may be prudent to consider withdrawing endotracheal tube approximately 2 cm. Airspace opacity in both upper lobes is again noted. Suspect multifocal pneumonia, although pulmonary edema could present in this manner. Cardiac prominence is stable. Apparent calcification left carotid artery. Electronically Signed   By: Bretta Bang  III M.D.   On: 01/01/2016 16:05   Ct Head Wo Contrast  12/13/2015  CLINICAL DATA:  Status post cardiopulmonary arrest today. History was not able to be obtained. Initial encounter. EXAM: CT HEAD WITHOUT CONTRAST TECHNIQUE: Contiguous axial images were obtained from the base of the skull through the vertex without intravenous contrast. COMPARISON:  Head CT scan 10/18/2015. FINDINGS: The brain appears normal without hemorrhage, infarct, mass lesion,  mass effect or midline shift. No hemorrhage or abnormal extra-axial fluid collection is identified. Gray-white differentiation is maintained. The calvarium is intact. Imaged paranasal sinuses and mastoid air cells are clear. IMPRESSION: Negative head CT. Electronically Signed   By: Drusilla Kanner M.D.   On: 12/09/2015 15:06   Dg Chest Portable 1 View  01/01/2016  CLINICAL DATA:  Unresponsive.  Status post intubation. EXAM: PORTABLE CHEST 1 VIEW COMPARISON:  October 20, 2015. FINDINGS: Stable cardiomegaly. Endotracheal tube is noted with distal tip 2.5 cm above the carina. No pneumothorax or pleural effusion is noted. Left internal jugular dialysis catheter is noted. Left lung is clear. Large right upper lobe airspace opacity is noted concerning for pneumonia or edema. Nasogastric tube is seen entering stomach. IMPRESSION: Endotracheal and nasogastric tubes are in grossly good position. Large right upper lobe airspace opacity is noted concerning for pneumonia or asymmetric edema. Electronically Signed   By: Lupita Raider, M.D.   On: 12/16/2015 13:23    EKG:   Orders placed or performed during the hospital encounter of 12/18/2015  . EKG 12-Lead  . EKG 12-Lead    ASSESSMENT AND PLAN:   1. Acute respiratory failure with hypoxia, with PEA cardiopulmonary arrest.   troponin is negative.  Off heparin drip due to bloody fluid from OG tube.  Continue hypothermia and vent.   2. Sepsis, multifocal pneumonia, leukocytosis and tachycardia.  continue Zosyn and vancomycin, f/u blood cultures. Leukocytosis improved.  3. Acute encephalopathy. 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  a markedly abnormal electroencephalogram, consistent with the possibility of significant brain injury.   4. End-stage renal disease on dialysis Tuesday Thursday and Saturday.   F/u Dr. Cherylann Ratel.  5. Hypotension. Improved, off Levophed drip 6. Type 2 diabetes, on sliding-scale 7. History of stridor was then a follow-up with ENT as  outpatient 8. Chronic diastolic congestive heart failure. Hod HF meds per Cardiology.  * Hypokalemia. KCl supplement. * Anemia of chronic disease. Hb down to 7.2, may need PRBC transfusion. * Diarrhea. Pt had a lot diarrhea last night. But CDT is negative. * GIB. Bloody fluid from OGT. protonix iv bid. F/u Hb.   Pt is very critical and has very poor prognosis. Discussed with Dr. Nicholos Johns and Dr. Cherylann Ratel. All the records are reviewed and case discussed with Care Management/Social Workerr. Management plans discussed with the patient, family and they are in agreement.  CODE STATUS: full code.  TOTAL CRITICAL TIME TAKING CARE OF THIS PATIENT: 52 minutes.  Greater than 50% time was spent on coordination of care and face-to-face counseling.  POSSIBLE D/C IN ? DAYS, DEPENDING ON CLINICAL CONDITION.   Shaune Pollack M.D on 12/27/2015 at 12:47 PM  Between 7am to 6pm - Pager - [phone]  After 6pm go to [email or web address] - password [email or web address] [facility]  [place] Hospitalists  Office  [phone]  CC: Primary care physician; Mosetta Pigeon, MD

## 2015-12-27 NOTE — Progress Notes (Signed)
Patient: Karen Rich / Admit Date: 2016/01/16 / Date of Encounter: 12/27/2015, 8:00 AM   Subjective: No acute events overnight. EEG pending. Echo pending. Bright blood per multiple lines. Heparin stopped overnight. Troponin negative x 3.   Review of Systems: Review of Systems  Unable to perform ROS: intubated     Objective: Telemetry: NSR, 60's Physical Exam: Blood pressure 122/72, pulse 66, temperature 91.4 F (33 C), temperature source Core (Comment), resp. rate 15, weight 165 lb 1.6 oz (74.889 kg), SpO2 100 %. Body mass index is 29.25 kg/(m^2). General: Critically ill appearing. Head: Normocephalic, atraumatic, sclera non-icteric, no xanthomas, nares are without discharge. Neck: Negative for carotid bruits. JVP not elevated. Lungs: Decreased breath sounds bilaterally. Intubated. Heart: RRR S1 S2 without murmurs, rubs, or gallops.  Abdomen: Soft, non-tender, non-distended with normoactive bowel sounds. No rebound/guarding. Extremities: No clubbing or cyanosis. No edema.  Neuro: Intubated and sedated. Psych:  Intubated and sedated.   Intake/Output Summary (Last 24 hours) at 12/27/15 0800 Last data filed at 12/27/15 0631  Gross per 24 hour  Intake 4413.64 ml  Output   2200 ml  Net 2213.64 ml    Inpatient Medications:  . sodium chloride  2,000 mL Intravenous Once  . antiseptic oral rinse  7 mL Mouth Rinse 10 times per day  . artificial tears  1 application Both Eyes 3 times per day  . aspirin  300 mg Rectal Daily  . chlorhexidine gluconate  15 mL Mouth Rinse BID  . insulin aspart  0-15 Units Subcutaneous 6 times per day  . ipratropium-albuterol  3 mL Nebulization Q6H  . pantoprazole (PROTONIX) IV  40 mg Intravenous QHS  . piperacillin-tazobactam (ZOSYN)  IV  3.375 g Intravenous Q12H  . sodium chloride  3 mL Intravenous Q12H  . vancomycin  750 mg Intravenous Q T,Th,Sa-HD   Infusions:  . cisatracurium (NIMBEX) infusion 1.491 mcg/kg/min (12/27/15 0600)  . fentaNYL  infusion INTRAVENOUS 75 mcg/hr (12/27/15 0600)  . heparin 900 Units/hr (12/27/15 0600)  . norepinephrine (LEVOPHED) Adult infusion Stopped (16-Jan-2016 1850)  . propofol (DIPRIVAN) infusion 20 mcg/kg/min (12/27/15 0616)  .  sodium bicarbonate  infusion 1000 mL 125 mL/hr at 12/27/15 4098    Labs:  Recent Labs  01-16-2016 1854  12/27/15 0220 12/27/15 0636  NA 136  < > 136 136  K 2.9*  < > 2.9* 3.2*  CL 104  < > 101 101  CO2 23  < > 26 27  GLUCOSE 200*  < > 309* 284*  BUN 29*  < > 33* 34*  CREATININE 4.42*  < > 4.57* 4.61*  CALCIUM 9.5  < > 9.1 9.0  MG 1.7  --   --   --   < > = values in this interval not displayed.  Recent Labs  01/16/16 1237  AST 51*  ALT 41  ALKPHOS 170*  BILITOT 0.8  PROT 6.7  ALBUMIN 2.6*    Recent Labs  Jan 16, 2016 1237 12/27/15 0220  WBC 19.0* 13.1*  NEUTROABS 16.3*  --   HGB 8.2* 7.5*  HCT 28.3* 23.7*  MCV 91.3 84.2  PLT 202 187    Recent Labs  January 16, 2016 1854 2016/01/16 1929 12/27/15 0005 12/27/15 0220  TROPONINI <0.03 0.04* <0.03 0.03   Invalid input(s): POCBNP No results for input(s): HGBA1C in the last 72 hours.   Weights: Filed Weights   Jan 16, 2016 1329  Weight: 165 lb 1.6 oz (74.889 kg)     Radiology/Studies:  Dg Chest 1 View  2016-01-09  CLINICAL DATA:  Central catheter placement EXAM: CHEST 1 VIEW COMPARISON:  Study obtained earlier in the day FINDINGS: There is a new right-sided central catheter with the tip just beyond the cavoatrial junction in the right atrium. Left-sided central catheter tip is at the cavoatrial junction. Endotracheal tube tip is 1.3 cm above the carina. Nasogastric tube tip and side port are below the diaphragm. No pneumothorax. Airspace consolidation in both upper lobes is present, more severe on the right than on the left, not significantly changed. No new opacity. Heart prominent but stable. There is apparent calcification in the left carotid artery. IMPRESSION: Tube and catheter positions as described  without pneumothorax. Note that the endotracheal tube tip is 1.3 cm above the carina. It may be prudent to consider withdrawing endotracheal tube approximately 2 cm. Airspace opacity in both upper lobes is again noted. Suspect multifocal pneumonia, although pulmonary edema could present in this manner. Cardiac prominence is stable. Apparent calcification left carotid artery. Electronically Signed   By: Bretta Bang III M.D.   On: 01-09-16 16:05   Ct Head Wo Contrast  01-09-2016  CLINICAL DATA:  Status post cardiopulmonary arrest today. History was not able to be obtained. Initial encounter. EXAM: CT HEAD WITHOUT CONTRAST TECHNIQUE: Contiguous axial images were obtained from the base of the skull through the vertex without intravenous contrast. COMPARISON:  Head CT scan 10/18/2015. FINDINGS: The brain appears normal without hemorrhage, infarct, mass lesion, mass effect or midline shift. No hemorrhage or abnormal extra-axial fluid collection is identified. Gray-white differentiation is maintained. The calvarium is intact. Imaged paranasal sinuses and mastoid air cells are clear. IMPRESSION: Negative head CT. Electronically Signed   By: Drusilla Kanner M.D.   On: 01-09-2016 15:06   Dg Chest Portable 1 View  2016/01/09  CLINICAL DATA:  Unresponsive.  Status post intubation. EXAM: PORTABLE CHEST 1 VIEW COMPARISON:  October 20, 2015. FINDINGS: Stable cardiomegaly. Endotracheal tube is noted with distal tip 2.5 cm above the carina. No pneumothorax or pleural effusion is noted. Left internal jugular dialysis catheter is noted. Left lung is clear. Large right upper lobe airspace opacity is noted concerning for pneumonia or edema. Nasogastric tube is seen entering stomach. IMPRESSION: Endotracheal and nasogastric tubes are in grossly good position. Large right upper lobe airspace opacity is noted concerning for pneumonia or asymmetric edema. Electronically Signed   By: Lupita Raider, M.D.   On: 01/09/2016  13:23     Assessment and Plan   1. Acute respiratory failure with hypoxia with history of cardiac arrest found to be in PEA s/p ROSC: -Currently intubated, wean as able per PCCM -Likely in the setting of multifocal PNA, though cannot rule out acute ischemic event at this time currently -CXR shows multifocal PNA -Check echo to evaluate LV systolic function and wall motion to trend with prior studies -Troponin negative x 3 -Given the negative troponins, recent low risk nuclear stress test, and above bleeding agree with stopping heparin gtt at this time -Given this was PEA must be concerned for a PE. Consider CTA PE protocol timed with her HD -Code ICE, per PCCM  2. Septic shock: -Likely in the setting of the above -On vancomycin and Zosyn per IM -WBC 19,000-->13,000 -Blood cultures are pending -Levophed by IM  3. ESRD on HD: -Volume management by Renal -Has not missed any HD sesions -Renal on board  4. Hypotension: -As above  5. Chronic diastolic CHF: -Hold HF medications in the setting of the above  6. Encephalopathy: -No neurological response   7. Hypokalemia: -Being repleted  8. Dispo: -Overall poor prognosis   Signed, Eula Listen, PA-C Pager: [phone] 12/27/2015, 8:00 AM

## 2015-12-27 NOTE — Progress Notes (Signed)
ANTIBIOTIC CONSULT NOTE - INITIAL  Pharmacy Consult for vancomycin & piperacillin/tazobactam Indication: pneumonia  No Known Allergies  Patient Measurements: Weight: 165 lb 1.6 oz (74.889 kg) Adjusted Body Weight: 61 kg  Vital Signs: Temp: 91.4 F (33 C) (01/19 1200) Temp Source: Core (Comment) (01/19 1200) BP: 107/64 mmHg (01/19 1200) Pulse Rate: 60 (01/19 1200)  Labs:  Recent Labs  01/16/16 1237  12/27/15 0220 12/27/15 0636 12/27/15 1141 12/27/15 1152  WBC 19.0*  --  13.1*  --   --  10.9  HGB 8.2*  --  7.5*  --   --  7.2*  PLT 202  --  187  --   --  188  CREATININE 4.36*  < > 4.57* 4.61* 4.76*  --   < > = values in this interval not displayed. Estimated Creatinine Clearance: 15.1 mL/min (by C-G formula based on Cr of 4.76). No results for input(s): VANCOTROUGH, VANCOPEAK, VANCORANDOM, GENTTROUGH, GENTPEAK, GENTRANDOM, TOBRATROUGH, TOBRAPEAK, TOBRARND, AMIKACINPEAK, AMIKACINTROU, AMIKACIN in the last 72 hours.   Microbiology: Recent Results (from the past 720 hour(s))  CULTURE, BLOOD (ROUTINE X 2) w Reflex to PCR ID Panel     Status: None (Preliminary result)   Collection Time: January 16, 2016  5:07 PM  Result Value Ref Range Status   Specimen Description BLOOD RIGHT HAND  Final   Special Requests BOTTLES DRAWN AEROBIC AND ANAEROBIC  Final   Culture NO GROWTH < 12 HOURS  Final   Report Status PENDING  Incomplete  CULTURE, BLOOD (ROUTINE X 2) w Reflex to PCR ID Panel     Status: None (Preliminary result)   Collection Time: 16-Jan-2016  5:07 PM  Result Value Ref Range Status   Specimen Description BLOOD RIGHT HAND  Final   Special Requests BOTTLES DRAWN AEROBIC AND ANAEROBIC  Final   Culture NO GROWTH < 12 HOURS  Final   Report Status PENDING  Incomplete  C difficile quick scan w PCR reflex     Status: None   Collection Time: Jan 16, 2016  5:10 PM  Result Value Ref Range Status   C Diff antigen NEGATIVE NEGATIVE Final   C Diff toxin NEGATIVE NEGATIVE Final   C Diff  interpretation Negative for C. difficile  Final    Assessment: Pharmacy consulted to dose vancomycin and piperacillin/tazobactam in this 40 year old female for pneumonia. Patient has a history of HF, HTN, ESRD on HD TTS and was brought in with cardiopulmonary arrest PEA in the field.   Patient's adjusted body weight is 61 kg.   Goal of Therapy:  Vancomycin trough level 15-20 mcg/ml  Plan:  Measure antibiotic drug levels at steady state Follow up culture results  Will continue Zosyn 3.375 g EI q 12 hours.   No plans for HD today as patient is not stable enough, so will adjust vancomycin 750 mg iv q HD T/TH/SA to start 1/21. Will need to follow plans for dialysis and order vancomycin doses and pre-HD level accordingly.   Pharmacy will continue to monitor, thank you for the consult.  Valentina Gu, PharmD Clinical Pharmacist 12/27/2015,12:59 PM

## 2015-12-27 NOTE — Progress Notes (Signed)
Katherine Roan from [facility] called for an update on pt condition and plan of care.  Request update if there is a change in pt condition, or a family meeting.  Will call back in the morning for another update.

## 2015-12-27 NOTE — Procedures (Signed)
ELECTROENCEPHALOGRAM REPORT   Patient: Karen Rich       Room #: IC15A-AA EEG No. ID: [id number] Age: 40 y.o.        Sex: female Referring Physician: Renae Gloss Report Date:  12/27/2015        Interpreting Physician: Thana Farr  History: Karen Rich is an 40 y.o. female unresponsive s/p cardiac arrest  Medications:  Scheduled: . sodium chloride  2,000 mL Intravenous Once  . antiseptic oral rinse  7 mL Mouth Rinse 10 times per day  . artificial tears  1 application Both Eyes 3 times per day  . aspirin  300 mg Rectal Daily  . chlorhexidine gluconate  15 mL Mouth Rinse BID  . insulin aspart  0-15 Units Subcutaneous 6 times per day  . ipratropium-albuterol  3 mL Nebulization Q6H  . pantoprazole (PROTONIX) IV  40 mg Intravenous QHS  . piperacillin-tazobactam (ZOSYN)  IV  3.375 g Intravenous Q12H  . sodium chloride  3 mL Intravenous Q12H  . vancomycin  750 mg Intravenous Q T,Th,Sa-HD    Conditions of Recording:  This is a 16 channel EEG carried out with the patient in the intubated and unresponsive state.  Description:  The background activity is discontinuous.  The majority of the background is severely attenuated.  With sensitivity increased to 3uV/mm there continues to be no discernable background activity.  Intermittently but infrequently during the recording are bursts of high voltage polyspike, spike and slow wave activity.  These bursts are generalized and last up to one second.   Hyperventilation and intermittent photic stimulation were not performed.      IMPRESSION: This is a markedly abnormal electroencephalogram secondary to the absence of background activity and occasionally superimposed burst activity.  This finding is consistent with the possibility of significant brain injury.     Thana Farr, MD Neurology [phone] 12/27/2015, 8:15 AM

## 2015-12-27 NOTE — Progress Notes (Signed)
[facility]  ROUNDING NOTE   Subjective:  Patient well known to [place]. We follow her for outpatient hemodialysis. She presented with cardiopulmonary arrest PEA in the field. She is currently on the hypothermia protocol. She had a second PEA arrest when she arrived in the emergency department. At that time she was given 1 of epinephrine, 2 calcium gluconate pushes, and 2 amps of bicarbonate.  Objective:  Vital signs in last 24 hours:  Temp:  [89.2 F (31.8 C)-92.2 F (33.4 C)] 91.4 F (33 C) (01/19 0900) Pulse Rate:  [54-67] 61 (01/19 0900) Resp:  [9-27] 15 (01/19 0900) BP: (94-140)/(51-78) 120/69 mmHg (01/19 0900) SpO2:  [88 %-100 %] 100 % (01/19 0900) FiO2 (%):  [35 %-100 %] 35 % (01/19 0910) Weight:  [74.889 kg (165 lb 1.6 oz)] 74.889 kg (165 lb 1.6 oz) (01/18 1329)  Weight change:  Filed Weights   2016-01-02 1329  Weight: 74.889 kg (165 lb 1.6 oz)    Intake/Output: I/O last 3 completed shifts: In: 4413.6 [I.V.:3363.6; IV Piggyback:1050] Out: 2200 [Stool:2200]   Intake/Output this shift:  Total I/O In: 562.8 [I.V.:462.8; IV Piggyback:100] Out: 25 [Urine:25]  Physical Exam: General: Critically ill appearing  Head: Normocephalic, atraumatic. Moist oral mucosal membranes  Eyes: Anicteric, pupils 2mm, not reacting  Neck: Supple, trachea midline  Lungs:  Clear to auscultation, vent assisted  Heart: S1S2 periods of irregularity  Abdomen:  Soft, nontender, BS present  Extremities:  2+ peripheral edema.  Neurologic: Medically paralyzed and sedated, on hypothermia protocol  Skin: Old excoriations on b/l LE's  Access: L IJ PC    Basic Metabolic Panel:  Recent Labs Lab 01/02/2016 1854 01/02/2016 2147 12/27/15 0005 12/27/15 0220 12/27/15 0636  NA 136 136 135 136 136  K 2.9* 2.8* 3.0* 2.9* 3.2*  CL 104 102 101 101 101  CO2 GLUCOSE 200* 262* 292* 309* 284*  BUN 29* 31* 31* 33* 34*  CREATININE 4.42* 4.59* 4.59* 4.57* 4.61*  CALCIUM 9.5 9.3  9.3 9.1 9.0  MG 1.7  --   --   --   --     Liver Function Tests:  Recent Labs Lab 02-Jan-2016 1237  AST 51*  ALT 41  ALKPHOS 170*  BILITOT 0.8  PROT 6.7  ALBUMIN 2.6*   No results for input(s): LIPASE, AMYLASE in the last 168 hours. No results for input(s): AMMONIA in the last 168 hours.  CBC:  Recent Labs Lab 01-02-16 1237 12/27/15 0220  WBC 19.0* 13.1*  NEUTROABS 16.3*  --   HGB 8.2* 7.5*  HCT 28.3* 23.7*  MCV 91.3 84.2  PLT 202 187    Cardiac Enzymes:  Recent Labs Lab 01/02/16 1237 2016-01-02 1854 2016/01/02 1929 12/27/15 0005 12/27/15 0220  TROPONINI 0.03 <0.03 0.04* <0.03 0.03    BNP: Invalid input(s): POCBNP  CBG:  Recent Labs Lab 12/27/15 0155 12/27/15 0236 12/27/15 0431 12/27/15 0643 12/27/15 0742  GLUCAP 286* 267* 238* 264* 275*    Microbiology: Results for orders placed or performed during the hospital encounter of 01-02-2016  C difficile quick scan w PCR reflex     Status: None   Collection Time: 01-02-16  5:10 PM  Result Value Ref Range Status   C Diff antigen NEGATIVE NEGATIVE Final   C Diff toxin NEGATIVE NEGATIVE Final   C Diff interpretation Negative for C. difficile  Final    Coagulation Studies:  Recent Labs  01-02-16 1929 01-02-16 2147 12/27/15 0220  LABPROT 21.0* 19.7*  19.0*  INR 1.82 1.67 1.59    Urinalysis:  Recent Labs  01/07/2016 1237  COLORURINE YELLOW*  LABSPEC 1.024  PHURINE QUANTITY NOT SUFFICIENT FOR TESTING  GLUCOSEU QUANTITY NOT SUFFICIENT  HGBUR QUANTITY NOT SUFFICIENT  BILIRUBINUR QUANTITY NOT SUFFICIENT  KETONESUR QUANTITY NOT SUFFICIENT  PROTEINUR QUANTITY NOT SUFFICIENT  NITRITE QUANTITY NOT SUFFICIENT  LEUKOCYTESUR QUANTITY NOT SUFFICIENT      Imaging: Dg Chest 1 View  12/24/2015  CLINICAL DATA:  Central catheter placement EXAM: CHEST 1 VIEW COMPARISON:  Study obtained earlier in the day FINDINGS: There is a new right-sided central catheter with the tip just beyond the cavoatrial junction in  the right atrium. Left-sided central catheter tip is at the cavoatrial junction. Endotracheal tube tip is 1.3 cm above the carina. Nasogastric tube tip and side port are below the diaphragm. No pneumothorax. Airspace consolidation in both upper lobes is present, more severe on the right than on the left, not significantly changed. No new opacity. Heart prominent but stable. There is apparent calcification in the left carotid artery. IMPRESSION: Tube and catheter positions as described without pneumothorax. Note that the endotracheal tube tip is 1.3 cm above the carina. It may be prudent to consider withdrawing endotracheal tube approximately 2 cm. Airspace opacity in both upper lobes is again noted. Suspect multifocal pneumonia, although pulmonary edema could present in this manner. Cardiac prominence is stable. Apparent calcification left carotid artery. Electronically Signed   By: Bretta Bang III M.D.   On: 12/25/2015 16:05   Ct Head Wo Contrast  01/06/2016  CLINICAL DATA:  Status post cardiopulmonary arrest today. History was not able to be obtained. Initial encounter. EXAM: CT HEAD WITHOUT CONTRAST TECHNIQUE: Contiguous axial images were obtained from the base of the skull through the vertex without intravenous contrast. COMPARISON:  Head CT scan 10/18/2015. FINDINGS: The brain appears normal without hemorrhage, infarct, mass lesion, mass effect or midline shift. No hemorrhage or abnormal extra-axial fluid collection is identified. Gray-white differentiation is maintained. The calvarium is intact. Imaged paranasal sinuses and mastoid air cells are clear. IMPRESSION: Negative head CT. Electronically Signed   By: Drusilla Kanner M.D.   On: 12/31/2015 15:06   Dg Chest Portable 1 View  12/31/2015  CLINICAL DATA:  Unresponsive.  Status post intubation. EXAM: PORTABLE CHEST 1 VIEW COMPARISON:  October 20, 2015. FINDINGS: Stable cardiomegaly. Endotracheal tube is noted with distal tip 2.5 cm above the  carina. No pneumothorax or pleural effusion is noted. Left internal jugular dialysis catheter is noted. Left lung is clear. Large right upper lobe airspace opacity is noted concerning for pneumonia or edema. Nasogastric tube is seen entering stomach. IMPRESSION: Endotracheal and nasogastric tubes are in grossly good position. Large right upper lobe airspace opacity is noted concerning for pneumonia or asymmetric edema. Electronically Signed   By: Lupita Raider, M.D.   On: 12/23/2015 13:23     Medications:   . cisatracurium (NIMBEX) infusion 1.491 mcg/kg/min (12/27/15 0600)  . fentaNYL infusion INTRAVENOUS 75 mcg/hr (12/27/15 0600)  . norepinephrine (LEVOPHED) Adult infusion Stopped (12/24/2015 1850)  . propofol (DIPRIVAN) infusion 20 mcg/kg/min (12/27/15 0616)  .  sodium bicarbonate  infusion 1000 mL 125 mL/hr at 12/27/15 0619   . sodium chloride  2,000 mL Intravenous Once  . antiseptic oral rinse  7 mL Mouth Rinse 10 times per day  . artificial tears   Both Eyes 3 times per day  . aspirin  300 mg Rectal Daily  . chlorhexidine gluconate  15 mL  Mouth Rinse BID  . insulin aspart  0-15 Units Subcutaneous 6 times per day  . ipratropium-albuterol  3 mL Nebulization Q6H  . pantoprazole (PROTONIX) IV  40 mg Intravenous QHS  . piperacillin-tazobactam (ZOSYN)  IV  3.375 g Intravenous Q12H  . sodium chloride  3 mL Intravenous Q12H  . vancomycin  750 mg Intravenous Q T,Th,Sa-HD   acetaminophen **OR** acetaminophen, [COMPLETED] cisatracurium **AND** cisatracurium (NIMBEX) infusion **AND** cisatracurium, fentaNYL  Assessment/ Plan:  40 yo AAF with h/o type 2 diabetes, HLD, HTN, CHF and ESRD on hemodialysis. Started hemodialysis in the hospital, Nov. 2016.   1. End-stage renal disease on hemodialysis Tuesday, Thursday, Saturday. The patient is currently critically ill. Potassium is actually low at the moment. Given overall instability has been to perform dialysis at the moment. She will undergo  rewarming later today. We will reassess the patient for dialysis tomorrow.  2. Acute respiratory failure/PEA arrest. Patient had PEA arrest 2 upon presentation. Currently on hypothermia protocol. Continue the protocol as well as vent support.  3. Anemia of chronic kidney disease. Hemoglobin currently 7.5. Transfuse for hemoglobin of 70 or less.  4. Secondary hyperparathyroidism. Check PTH as well as phosphorus with the next dialysis treatment.  5. Overall prognosis quite guarded.    LOS: 1 Glendon Dunwoody 1/19/20179:33 AM

## 2015-12-27 NOTE — Progress Notes (Signed)
*  PRELIMINARY RESULTS* Echocardiogram 2D Echocardiogram has been performed.  Georgann Housekeeper Hege 12/27/2015, 3:07 PM

## 2015-12-27 NOTE — Progress Notes (Signed)
eLink Physician-Brief Progress Note Patient Name: Karen Rich DOB: Sep 29, 1976 MRN: [id number]   Date of Service  12/27/2015  HPI/Events of Note  DM 2. Hyperglycemia. No SSI ordered  eICU Interventions  Moderate scale SSI q 4 hrs ordered     Intervention Category Major Interventions: Hyperglycemia - active titration of insulin therapy  Billy Fischer 12/27/2015, 12:25 AM

## 2015-12-27 NOTE — Progress Notes (Signed)
Chaplain rounded in the unit and offered a compassionate presence and support to the patient.  Said silent prayer. Chaplain Ngai Parcell [phone] 

## 2015-12-27 NOTE — Progress Notes (Signed)
[facility] Critical Care Medicine Progess Note    ASSESSMENT/PLAN    40 yo female with ESRD, diastolic/systolic CHF now with PEA arrest with prolonged downtime. Initiated on hypothermia protocol.   NEUROLOGIC A: Encephalopathy/Coma secondary to cardiac arrest.  P:  --Discussed with neurology; EEG shows slow/minimal activity compatible with poor prognosis.  --Continue hypothermia protocol.  --Baseline before initiation of protocol had minimal reflexes with posturing.  --Prognosis appears poor.    PULMONARY  A:VDRF due to cardiac arrest.  P:  Continue vent support, will repeat CXR and blood gases.    CARDIOVASCULAR  A: Acute systolic and diastolic CHF.  Cardiogenic shock, s/p cardiac arrest.  P:  Continue levophed, fluid resuscitation.  Heparin infusion stopped due to blood in OG and stool.   RENAL A: ARF.  P:  HD per nephro.  Continue bicarb drip.     GASTROINTESTINAL A: Continue diarrhea of uncertain etiology.  -Blood from OG tube.   Gi prophylaxis. Repeat CBC, monitor Hb.   HEMATOLOGIC A: Leukocytosis, likely stress reaction.  --Transfuse as needed.   INFECTIOUS A: Will monitor cultures.   Zosyn 1/18>> Vancomycin 1/18>>  ENDOCRINE A: DM, continued hyperglycemia.  --Continue ssi coverage, added levemir.      MAJOR EVENTS/TEST RESULTS: Cardiac arrest 1/18.  Cardiac arrest 11/07     --------------------------------------- -------------------   Name: GIRL SCHISSLER MRN: [id number] DOB: [birth year]    ADMISSION DATE:  Jan 21, 2016     SUBJECTIVE:   Pt currently on the ventilator, can not provide history or review of systems.  Marland Kitchen    VITAL SIGNS: Temp:  [89.2 F (31.8 C)-92.2 F (33.4 C)] 91.4 F (33 C) (01/19 0900) Pulse Rate:  [54-67] 61 (01/19 0900) Resp:  [9-27] 15 (01/19 0900) BP: (94-140)/(51-78) 120/69 mmHg (01/19 0900) SpO2:  [88 %-100 %] 100 % (01/19 0900) FiO2 (%):  [35 %-100 %] 35 % (01/19  0910) Weight:  [165 lb 1.6 oz (74.889 kg)] 165 lb 1.6 oz (74.889 kg) (01/18 1329) HEMODYNAMICS: CVP:  [8 mmHg-18 mmHg] 14 mmHg VENTILATOR SETTINGS: Vent Mode:  [-] PRVC FiO2 (%):  [35 %-100 %] 35 % Set Rate:  [14 bmp-16 bmp] 14 bmp Vt Set:  [450 mL-500 mL] 500 mL PEEP:  [5 cmH20] 5 cmH20 Plateau Pressure:  [25 cmH20] 25 cmH20 INTAKE / OUTPUT:  Intake/Output Summary (Last 24 hours) at 12/27/15 1104 Last data filed at 12/27/15 0900  Gross per 24 hour  Intake 4976.39 ml  Output   2225 ml  Net 2751.39 ml    PHYSICAL EXAMINATION: Physical Examination:   VS: BP 120/69 mmHg  Pulse 61  Temp(Src) 91.4 F (33 C) (Core (Comment))  Resp 15  Wt 165 lb 1.6 oz (74.889 kg)  SpO2 100%  LMP  (LMP Unknown)  General Appearance: No distress  Neuro:nothing elicited, pt currently on paralytics.  HEENT: on paralytics.  Pulmonary: normal breath sounds   CardiovascularNormal S1,S2.  No m/r/g.   Abdomen: Benign, Soft, non-tender. Renal:  No costovertebral tenderness  GU:  Not performed at this time. Endocrine: No evident thyromegaly. Skin:   warm, no rashes, no ecchymosis  Extremities: normal, no cyanosis, clubbing.   LABS:   LABORATORY PANEL:   CBC  Recent Labs Lab 12/27/15 0220  WBC 13.1*  HGB 7.5*  HCT 23.7*  PLT 187    Chemistries   Recent Labs Lab 01-21-2016 1237  12/27/15 0636 12/27/15 0854  NA 137  < > 136  --   K 4.5  < > 3.2*  --  CL 105  < > 101  --   CO2 22  < > 27  --   GLUCOSE 149*  < > 284*  --   BUN 23*  < > 34*  --   CREATININE 4.36*  < > 4.61*  --   CALCIUM 12.3*  < > 9.0  --   MG  --   < >  --  1.7  AST 51*  --   --   --   ALT 41  --   --   --   ALKPHOS 170*  --   --   --   BILITOT 0.8  --   --   --   < > = values in this interval not displayed.   Recent Labs Lab 01/05/2016 2348 12/27/15 0155 12/27/15 0236 12/27/15 0431 12/27/15 0643 12/27/15 0742  GLUCAP 248* 286* 267* 238* 264* 275*    Recent Labs Lab 01/05/2016 1600 12/27/15 0356    PHART 7.45 7.48*  PCO2ART 29* 39  PO2ART 57* 117*    Recent Labs Lab 12/12/2015 1237  AST 51*  ALT 41  ALKPHOS 170*  BILITOT 0.8  ALBUMIN 2.6*    Cardiac Enzymes  Recent Labs Lab 12/27/15 0854  TROPONINI 0.03    RADIOLOGY:  Dg Chest 1 View  01/02/2016  CLINICAL DATA:  Central catheter placement EXAM: CHEST 1 VIEW COMPARISON:  Study obtained earlier in the day FINDINGS: There is a new right-sided central catheter with the tip just beyond the cavoatrial junction in the right atrium. Left-sided central catheter tip is at the cavoatrial junction. Endotracheal tube tip is 1.3 cm above the carina. Nasogastric tube tip and side port are below the diaphragm. No pneumothorax. Airspace consolidation in both upper lobes is present, more severe on the right than on the left, not significantly changed. No new opacity. Heart prominent but stable. There is apparent calcification in the left carotid artery. IMPRESSION: Tube and catheter positions as described without pneumothorax. Note that the endotracheal tube tip is 1.3 cm above the carina. It may be prudent to consider withdrawing endotracheal tube approximately 2 cm. Airspace opacity in both upper lobes is again noted. Suspect multifocal pneumonia, although pulmonary edema could present in this manner. Cardiac prominence is stable. Apparent calcification left carotid artery. Electronically Signed   By: Bretta Bang III M.D.   On: 12/10/2015 16:05   Ct Head Wo Contrast  12/23/2015  CLINICAL DATA:  Status post cardiopulmonary arrest today. History was not able to be obtained. Initial encounter. EXAM: CT HEAD WITHOUT CONTRAST TECHNIQUE: Contiguous axial images were obtained from the base of the skull through the vertex without intravenous contrast. COMPARISON:  Head CT scan 10/18/2015. FINDINGS: The brain appears normal without hemorrhage, infarct, mass lesion, mass effect or midline shift. No hemorrhage or abnormal extra-axial fluid collection is  identified. Gray-white differentiation is maintained. The calvarium is intact. Imaged paranasal sinuses and mastoid air cells are clear. IMPRESSION: Negative head CT. Electronically Signed   By: Drusilla Kanner M.D.   On: 12/27/2015 15:06   Dg Chest Portable 1 View  01/03/2016  CLINICAL DATA:  Unresponsive.  Status post intubation. EXAM: PORTABLE CHEST 1 VIEW COMPARISON:  October 20, 2015. FINDINGS: Stable cardiomegaly. Endotracheal tube is noted with distal tip 2.5 cm above the carina. No pneumothorax or pleural effusion is noted. Left internal jugular dialysis catheter is noted. Left lung is clear. Large right upper lobe airspace opacity is noted concerning for pneumonia or edema. Nasogastric tube is  seen entering stomach. IMPRESSION: Endotracheal and nasogastric tubes are in grossly good position. Large right upper lobe airspace opacity is noted concerning for pneumonia or asymmetric edema. Electronically Signed   By: Lupita Raider, M.D.   On: 12/24/2015 13:23       --Wells Guiles, MD.  Pager [phone] [facility]   Santiago Glad, M.D.  Stephanie Acre, M.D.  Billy Fischer, M.D  Critical Care Attestation.  I have personally obtained a history, examined the patient, evaluated laboratory and imaging results, formulated the assessment and plan and placed orders. The Patient requires high complexity decision making for assessment and support, frequent evaluation and titration of therapies, application of advanced monitoring technologies and extensive interpretation of multiple databases. The patient has critical illness that could lead imminently to failure of 1 or more organ systems and requires the highest level of physician preparedness to intervene.  Critical Care Time devoted to patient care services described in this note is 35 minutes and is exclusive of time spent in procedures.

## 2015-12-27 NOTE — Progress Notes (Addendum)
Subjective: Patient remains unresponsive.  Receiving Fentanyl and Nimbex.  Objective: Current vital signs: BP 107/64 mmHg  Pulse 60  Temp(Src) 91.4 F (33 C) (Core (Comment))  Resp 15  Wt 74.889 kg (165 lb 1.6 oz)  SpO2 100%  LMP  (LMP Unknown) Vital signs in last 24 hours: Temp:  [89.2 F (31.8 C)-91.6 F (33.1 C)] 91.4 F (33 C) (01/19 1200) Pulse Rate:  [54-67] 60 (01/19 1200) Resp:  [9-27] 15 (01/19 1200) BP: (103-127)/(58-78) 107/64 mmHg (01/19 1200) SpO2:  [99 %-100 %] 100 % (01/19 1200) FiO2 (%):  [35 %-75 %] 35 % (01/19 1321)  Intake/Output from previous day: 01/18 0701 - 01/19 0700 In: 4413.6 [I.V.:3363.6; IV Piggyback:1050] Out: 2200 [Stool:2200] Intake/Output this shift: Total I/O In: 1237.4 [I.V.:907.4; NG/GT:30; IV Piggyback:300] Out: 25 [Urine:25] Nutritional status: Diet NPO time specified  Neurologic Exam: Mental Status: Patient does not respond to verbal stimuli. Does not respond to deep sternal rub. Does not follow commands. No verbalizations noted.  Cranial Nerves: II: patient does not respond confrontation bilaterally, pupils right 3 mm, left 3 mm,and unreactive bilaterally III,IV,VI: doll's response absent bilaterally.  V,VII: corneal reflex absent bilaterally  VIII: patient does not respond to verbal stimuli IX,X: gag reflex unable to be tested due to ETT, XI: trapezius strength unable to test bilaterally XII: tongue strength unable to test Motor: Extremities flaccid throughout. No spontaneous movement noted. No purposeful movements noted. Sensory: Does not respond to noxious stimuli in any extremity. Deep Tendon Reflexes:  2+ in the upper extremities and absent in the lower extremities Plantars: mute bilaterally Cerebellar: Unable to perform  Lab Results: Basic Metabolic Panel:  Recent Labs Lab 2016-01-05 1854 01-05-2016 2147 12/27/15 0005 12/27/15 0220 12/27/15 0636 12/27/15 0854 12/27/15 1141  NA 136 136 135 136 136  --   135  K 2.9* 2.8* 3.0* 2.9* 3.2*  --  2.9*  CL 104 102 101 101 101  --  98*  CO2 --  28  GLUCOSE 200* 262* 292* 309* 284*  --  217*  BUN 29* 31* 31* 33* 34*  --  34*  CREATININE 4.42* 4.59* 4.59* 4.57* 4.61*  --  4.76*  CALCIUM 9.5 9.3 9.3 9.1 9.0  --  8.8*  MG 1.7  --   --   --   --  1.7  --   PHOS  --   --   --   --   --   --  3.5    Liver Function Tests:  Recent Labs Lab 01-05-2016 1237  AST 51*  ALT 41  ALKPHOS 170*  BILITOT 0.8  PROT 6.7  ALBUMIN 2.6*   No results for input(s): LIPASE, AMYLASE in the last 168 hours. No results for input(s): AMMONIA in the last 168 hours.  CBC:  Recent Labs Lab Jan 05, 2016 1237 12/27/15 0220 12/27/15 1152  WBC 19.0* 13.1* 10.9  NEUTROABS 16.3*  --   --   HGB 8.2* 7.5* 7.2*  HCT 28.3* 23.7* 22.5*  MCV 91.3 84.2 82.3  PLT 202 187 188    Cardiac Enzymes:  Recent Labs Lab 2016/01/05 1854 01-05-2016 1929 12/27/15 0005 12/27/15 0220 12/27/15 0854  TROPONINI <0.03 0.04* <0.03 0.03 0.03    Lipid Panel: No results for input(s): CHOL, TRIG, HDL, CHOLHDL, VLDL, LDLCALC in the last 168 hours.  CBG:  Recent Labs Lab 12/27/15 0431 12/27/15 0643 12/27/15 0742 12/27/15 0902 12/27/15 1157  GLUCAP 238* 264* 275* 237* 192*  Microbiology: Results for orders placed or performed during the hospital encounter of 12/09/2015  CULTURE, BLOOD (ROUTINE X 2) w Reflex to PCR ID Panel     Status: None (Preliminary result)   Collection Time: 12/20/2015  5:07 PM  Result Value Ref Range Status   Specimen Description BLOOD RIGHT HAND  Final   Special Requests BOTTLES DRAWN AEROBIC AND ANAEROBIC  Final   Culture NO GROWTH < 12 HOURS  Final   Report Status PENDING  Incomplete  CULTURE, BLOOD (ROUTINE X 2) w Reflex to PCR ID Panel     Status: None (Preliminary result)   Collection Time: 12/15/2015  5:07 PM  Result Value Ref Range Status   Specimen Description BLOOD RIGHT HAND  Final   Special Requests BOTTLES DRAWN AEROBIC AND  ANAEROBIC  Final   Culture NO GROWTH < 12 HOURS  Final   Report Status PENDING  Incomplete  C difficile quick scan w PCR reflex     Status: None   Collection Time: 01/05/2016  5:10 PM  Result Value Ref Range Status   C Diff antigen NEGATIVE NEGATIVE Final   C Diff toxin NEGATIVE NEGATIVE Final   C Diff interpretation Negative for C. difficile  Final    Coagulation Studies:  Recent Labs  01/04/2016 1929 12/18/2015 2147 12/27/15 0220  LABPROT 21.0* 19.7* 19.0*  INR 1.82 1.67 1.59    Imaging: Dg Chest 1 View  12/25/2015  CLINICAL DATA:  Central catheter placement EXAM: CHEST 1 VIEW COMPARISON:  Study obtained earlier in the day FINDINGS: There is a new right-sided central catheter with the tip just beyond the cavoatrial junction in the right atrium. Left-sided central catheter tip is at the cavoatrial junction. Endotracheal tube tip is 1.3 cm above the carina. Nasogastric tube tip and side port are below the diaphragm. No pneumothorax. Airspace consolidation in both upper lobes is present, more severe on the right than on the left, not significantly changed. No new opacity. Heart prominent but stable. There is apparent calcification in the left carotid artery. IMPRESSION: Tube and catheter positions as described without pneumothorax. Note that the endotracheal tube tip is 1.3 cm above the carina. It may be prudent to consider withdrawing endotracheal tube approximately 2 cm. Airspace opacity in both upper lobes is again noted. Suspect multifocal pneumonia, although pulmonary edema could present in this manner. Cardiac prominence is stable. Apparent calcification left carotid artery. Electronically Signed   By: Bretta Bang III M.D.   On: 01/05/2016 16:05   Ct Head Wo Contrast  12/25/2015  CLINICAL DATA:  Status post cardiopulmonary arrest today. History was not able to be obtained. Initial encounter. EXAM: CT HEAD WITHOUT CONTRAST TECHNIQUE: Contiguous axial images were obtained from the  base of the skull through the vertex without intravenous contrast. COMPARISON:  Head CT scan 10/18/2015. FINDINGS: The brain appears normal without hemorrhage, infarct, mass lesion, mass effect or midline shift. No hemorrhage or abnormal extra-axial fluid collection is identified. Gray-white differentiation is maintained. The calvarium is intact. Imaged paranasal sinuses and mastoid air cells are clear. IMPRESSION: Negative head CT. Electronically Signed   By: Drusilla Kanner M.D.   On: 12/19/2015 15:06   Dg Chest Port 1 View  12/27/2015  CLINICAL DATA:  PNA, Pt intubated, hx CHF, DM, HTN, dialysis pt, nonsmoker, pt unable to follow breathing instructions EXAM: PORTABLE CHEST 1 VIEW COMPARISON:  12/25/2015 FINDINGS: Endotracheal tube is in place with tip estimated to be in 4.8 cm above the carina. Right  IJ central line tip overlies the level of superior vena cava. Nasogastric tube is in place, tip beyond the gastroesophageal junction and off the film. Left IJ central line tip overlies the level of superior vena cava. Heart is enlarged. There has been a shift in the pattern of infiltrates, now predominantly involving the left lung base and diffusely involving the right lung. The diaphragms are partially or fully obscured. IMPRESSION: Significant airspace filling opacity with decreased aeration at the bases. Findings are consistent with pulmonary edema and/or infectious process. Electronically Signed   By: Norva Pavlov M.D.   On: 12/27/2015 13:14   Dg Chest Portable 1 View  12/27/2015  CLINICAL DATA:  Unresponsive.  Status post intubation. EXAM: PORTABLE CHEST 1 VIEW COMPARISON:  October 20, 2015. FINDINGS: Stable cardiomegaly. Endotracheal tube is noted with distal tip 2.5 cm above the carina. No pneumothorax or pleural effusion is noted. Left internal jugular dialysis catheter is noted. Left lung is clear. Large right upper lobe airspace opacity is noted concerning for pneumonia or edema. Nasogastric  tube is seen entering stomach. IMPRESSION: Endotracheal and nasogastric tubes are in grossly good position. Large right upper lobe airspace opacity is noted concerning for pneumonia or asymmetric edema. Electronically Signed   By: Lupita Raider, M.D.   On: 12/27/2015 13:23   Dg Abd Portable 1v  12/27/2015  CLINICAL DATA:  Abdominal distension, excessive stool EXAM: PORTABLE ABDOMEN - 1 VIEW COMPARISON:  None. FINDINGS: There is a relative paucity of bowel gas. There is no evidence of pneumoperitoneum, portal venous gas or pneumatosis. There are no pathologic calcifications along the expected course of the ureters. The osseous structures are unremarkable. IMPRESSION: Relative paucity of bowel gas which may reflect decompressed bowel versus fluid-filled bowel. Electronically Signed   By: Elige Ko   On: 12/27/2015 13:14    Medications:  I have reviewed the patient's current medications. Scheduled: . antiseptic oral rinse  7 mL Mouth Rinse 10 times per day  . artificial tears   Both Eyes 3 times per day  . chlorhexidine gluconate  15 mL Mouth Rinse BID  . insulin aspart  0-15 Units Subcutaneous 6 times per day  . insulin glargine  8 Units Subcutaneous Daily  . ipratropium-albuterol  3 mL Nebulization Q6H  . pantoprazole (PROTONIX) IV  40 mg Intravenous Q12H  . piperacillin-tazobactam (ZOSYN)  IV  3.375 g Intravenous Q12H  . sodium chloride  10-40 mL Intracatheter Q12H  . sodium chloride  3 mL Intravenous Q12H  . vancomycin  750 mg Intravenous Q T,Th,Sa-HD    Assessment/Plan: Patient remains unresponsive.  EEG shows no evidence of seizure activity but background activity is severely attenuated.  Head CT at admission was personally reviewed and was unremarkable.  Anoxic brain injury suspected but complete evaluation unable to be perform until patient no longer hypothermic and off paralytics.    Recommendations: 1.  Repeat head CT in AM   LOS: 1 day   Thana Farr,  MD Neurology [phone] 12/27/2015  3:02 PM

## 2015-12-27 NOTE — Progress Notes (Signed)
Dr.Simonds notified of critical potassium level of 2.9. New orders given. Also discussed CBG's remaining elevated. No new orders given.   Adjusting lab schedule to match protocol discussed. New orders given.

## 2015-12-27 NOTE — Progress Notes (Addendum)
CDS called per protocol.  Spoke with Joanne Chars.  Reference Number [id number]  Was told CDS will call back for more information.  0100- CDS called back. Will send representative for a chart review in AM.

## 2015-12-27 NOTE — Progress Notes (Signed)
Inpatient Diabetes Program Recommendations  AACE/ADA: New Consensus Statement on Inpatient Glycemic Control (2015)  Target Ranges:  Prepandial:   less than 140 mg/dL      Peak postprandial:   less than 180 mg/dL (1-2 hours)      Critically ill patients:  140 - 180 mg/dL   Review of Glycemic Control  Results for Karen Rich, Karen Rich (MRN [id number]) as of 12/27/2015 09:34  Ref. Range 12/27/2015 01:55 12/27/2015 02:36 12/27/2015 04:31 12/27/2015 06:43 12/27/2015 07:42  Glucose-Capillary Latest Ref Range: 65-99 mg/dL 956 (H) 213 (H) 086 (H) 264 (H) 275 (H)    Diabetes history: Type 2 Outpatient Diabetes medications: Glipizide 2.5mg  qday Current orders for Inpatient glycemic control: Novolog 0-15 units q4h  Inpatient Diabetes Program Recommendations: Blood sugars remain elevated despite current Novolog moderate correction scale q4h. Consider increasing Novolog correction insulin to 0-20 units q4h and starting low dose basal insulin Lantus 15 units (0.2units/kg) qday starting now.   Susette Racer, RN, BA, MHA, CDE Diabetes Coordinator Inpatient Diabetes Program  [phone] (Team Pager) [phone] [facility] Office) 12/27/2015 9:38 AM

## 2015-12-27 NOTE — Progress Notes (Signed)
Pt seen having slight posturing movement in lower extremities. Nimbex dose increased to 1.5 mcg/kg/min. Fentanyl rate increased to 75 mcg/hr.   Will continue to assess

## 2015-12-27 NOTE — Progress Notes (Signed)
Initial Nutrition Assessment   INTERVENTION:   Coordination of Care: pt not appropriate for initiation of EN at this time, recommend initiation of EN as soon as clinically feasible, will continue to assess   NUTRITION DIAGNOSIS:   Inadequate oral intake related to acute illness as evidenced by NPO status.  GOAL:   Provide needs based on ASPEN/SCCM guidelines  MONITOR:    (Energy Intake, Anthropometrics, Digestive System, Electrolyte/Renal Profile, Glucose Profile, Pulmonary)  REASON FOR ASSESSMENT:   Ventilator    ASSESSMENT:    Pt admitted s/p PEA arrest, currently sedated on vent, on TTM at target temperature, frank blood via OG tube, large amounts of liquid stool, abdomen distended; pt with acute CHF, acute on CKD on dialysis as outpatient  Past Medical History  Diagnosis Date  . Edema   . Hyperlipidemia   . Hyperglycemia   . Fatigue   . Hypertension   . Diabetes mellitus without complication (HCC)   . ESRD on hemodialysis (HCC)     T,T,S  . Chronic diastolic CHF (congestive heart failure) (HCC)      Diet Order:  Diet NPO time specified   Digestive System: >3 Liters of liquid stool, C.diff negative, full stool panel pending; OG to LIS with frank blood, abdomen distended with abdominal xray pending  Electrolyte and Renal Profile:  Recent Labs Lab 12/29/2015 1854  12/27/15 0220 12/27/15 0636 12/27/15 0854 12/27/15 1141  BUN 29*  < > 33* 34*  --  34*  CREATININE 4.42*  < > 4.57* 4.61*  --  4.76*  NA 136  < > 136 136  --  135  K 2.9*  < > 2.9* 3.2*  --  2.9*  MG 1.7  --   --   --  1.7  --   PHOS  --   --   --   --   --  3.5  < > = values in this interval not displayed. Glucose Profile:   Recent Labs  12/27/15 0742 12/27/15 0902 12/27/15 1157  GLUCAP 275* 237* 192*   Meds: ss novolog, lantus, levophed, potassium chloride, diprivan, sodium bicarb drip  Height:   Ht Readings from Last 1 Encounters:  12/21/15  (1.6 m)    Weight:   Wt  Readings from Last 1 Encounters:  12/25/2015 165 lb 1.6 oz (74.889 kg)    Filed Weights   01/01/2016 1329  Weight: 165 lb 1.6 oz (74.889 kg)   Wt Readings from Last 10 Encounters:  12/24/2015 165 lb 1.6 oz (74.889 kg)  12/21/15 171 lb (77.565 kg)  12/20/15 174 lb (78.926 kg)  11/19/15 193 lb (87.544 kg)  11/02/15 194 lb 14.2 oz (88.4 kg)  03/08/15 180 lb 4 oz (81.761 kg)    BMI:  Body mass index is 29.25 kg/(m^2).  Estimated Nutritional Needs:   Kcal:  assess on follow-up  Protein:  150-188 g (2.0-2.5 g/kg)   Fluid:  1875-2250 mL (25-30 ml/kg)      HIGH Care Level  Romelle Starcher MS, RD, LDN [phone] Pager  [phone] Weekend/On-Call Pager

## 2015-12-28 ENCOUNTER — Inpatient Hospital Stay: Payer: Medicaid Other

## 2015-12-28 DIAGNOSIS — G931 Anoxic brain damage, not elsewhere classified: Secondary | ICD-10-CM

## 2015-12-28 LAB — BASIC METABOLIC PANEL
ANION GAP: 9 (ref 5–15)
ANION GAP: 8 (ref 5–15)
ANION GAP: 10 (ref 5–15)
BUN: 35 mg/dL — ABNORMAL HIGH (ref 6–20)
BUN: 36 mg/dL — ABNORMAL HIGH (ref 6–20)
BUN: 40 mg/dL — ABNORMAL HIGH (ref 6–20)
CALCIUM: 8 mg/dL — AB (ref 8.9–10.3)
CALCIUM: 8.1 mg/dL — AB (ref 8.9–10.3)
CALCIUM: 8.3 mg/dL — AB (ref 8.9–10.3)
CO2: 29 mmol/L (ref 22–32)
CO2: 31 mmol/L (ref 22–32)
CO2: 30 mmol/L (ref 22–32)
CREATININE: 4.68 mg/dL — AB (ref 0.44–1.00)
CREATININE: 4.89 mg/dL — AB (ref 0.44–1.00)
CREATININE: 5.06 mg/dL — AB (ref 0.44–1.00)
Chloride: 94 mmol/L — ABNORMAL LOW (ref 101–111)
Chloride: 94 mmol/L — ABNORMAL LOW (ref 101–111)
Chloride: 96 mmol/L — ABNORMAL LOW (ref 101–111)
GFR, EST AFRICAN AMERICAN: 12 mL/min — AB (ref >60)
GFR, EST AFRICAN AMERICAN: 12 mL/min — AB (ref >60)
GFR, EST AFRICAN AMERICAN: 11 mL/min — AB (ref >60)
GFR, EST NON AFRICAN AMERICAN: 11 mL/min — AB (ref >60)
GFR, EST NON AFRICAN AMERICAN: 10 mL/min — AB (ref >60)
GFR, EST NON AFRICAN AMERICAN: 10 mL/min — AB (ref >60)
Glucose, Bld: 90 mg/dL (ref 65–99)
Glucose, Bld: 78 mg/dL (ref 65–99)
Glucose, Bld: 76 mg/dL (ref 65–99)
Potassium: 3 mmol/L — ABNORMAL LOW (ref 3.5–5.1)
Potassium: 3.2 mmol/L — ABNORMAL LOW (ref 3.5–5.1)
Potassium: 4 mmol/L (ref 3.5–5.1)
SODIUM: 132 mmol/L — AB (ref 135–145)
SODIUM: 133 mmol/L — AB (ref 135–145)
SODIUM: 136 mmol/L (ref 135–145)

## 2015-12-28 LAB — BLOOD GAS, ARTERIAL
ALLENS TEST (PASS/FAIL): POSITIVE — AB
Acid-base deficit: 3.3 mmol/L — ABNORMAL HIGH (ref 0.0–2.0)
BICARBONATE: 20.2 meq/L — AB (ref 21.0–28.0)
FIO2: 0.5
LHR: 450 {breaths}/min
O2 Saturation: 90.6 %
PATIENT TEMPERATURE: 37
PEEP: 5 cmH2O
PO2 ART: 57 mmHg — AB (ref 83.0–108.0)
pCO2 arterial: 29 mmHg — ABNORMAL LOW (ref 32.0–48.0)
pH, Arterial: 7.45 (ref 7.350–7.450)

## 2015-12-28 LAB — CBC
HCT: 24.1 % — ABNORMAL LOW (ref 35.0–47.0)
HEMOGLOBIN: 7.7 g/dL — AB (ref 12.0–16.0)
MCH: 25.8 pg — ABNORMAL LOW (ref 26.0–34.0)
MCHC: 31.8 g/dL — AB (ref 32.0–36.0)
MCV: 81 fL (ref 80.0–100.0)
Platelets: 230 10*3/uL (ref 150–440)
RBC: 2.97 MIL/uL — ABNORMAL LOW (ref 3.80–5.20)
RDW: 19.3 % — AB (ref 11.5–14.5)
WBC: 13.3 10*3/uL — ABNORMAL HIGH (ref 3.6–11.0)

## 2015-12-28 LAB — GLUCOSE, CAPILLARY
GLUCOSE-CAPILLARY: 33 mg/dL — AB (ref 65–99)
GLUCOSE-CAPILLARY: 49 mg/dL — AB (ref 65–99)
GLUCOSE-CAPILLARY: 53 mg/dL — AB (ref 65–99)
GLUCOSE-CAPILLARY: 63 mg/dL — AB (ref 65–99)
GLUCOSE-CAPILLARY: 80 mg/dL (ref 65–99)
GLUCOSE-CAPILLARY: 86 mg/dL (ref 65–99)
GLUCOSE-CAPILLARY: 90 mg/dL (ref 65–99)
Glucose-Capillary: 171 mg/dL — ABNORMAL HIGH (ref 65–99)
Glucose-Capillary: 61 mg/dL — ABNORMAL LOW (ref 65–99)
Glucose-Capillary: 74 mg/dL (ref 65–99)
Glucose-Capillary: 77 mg/dL (ref 65–99)
Glucose-Capillary: 99 mg/dL (ref 65–99)

## 2015-12-28 LAB — TRIGLYCERIDES: TRIGLYCERIDES: 35 mg/dL (ref <150)

## 2015-12-28 LAB — MAGNESIUM: MAGNESIUM: 1.4 mg/dL — AB (ref 1.7–2.4)

## 2015-12-28 MED ORDER — FENTANYL 2500MCG IN NS 250ML (10MCG/ML) PREMIX INFUSION
100.0000 ug/h | INTRAVENOUS | Status: DC
  Administered 2015-12-28: 200 ug/h via INTRAVENOUS

## 2015-12-28 MED ORDER — DEXTROSE 50 % IV SOLN
INTRAVENOUS | Status: AC
  Administered 2015-12-28: 25 mL via INTRAVENOUS
  Filled 2015-12-28: qty 50

## 2015-12-28 MED ORDER — DEXTROSE 50 % IV SOLN
25.0000 mL | Freq: Once | INTRAVENOUS | Status: AC
  Administered 2015-12-28: 25 mL via INTRAVENOUS

## 2015-12-28 MED ORDER — FENTANYL BOLUS VIA INFUSION
50.0000 ug | INTRAVENOUS | Status: DC | PRN
  Administered 2015-12-28: 100 ug via INTRAVENOUS
  Filled 2015-12-28: qty 200

## 2015-12-28 MED ORDER — POTASSIUM CHLORIDE 20 MEQ/15ML (10%) PO SOLN: 40.0000 meq | Freq: Once | ORAL | Status: DC

## 2015-12-28 MED ORDER — PROPOFOL 1000 MG/100ML IV EMUL
0.0000 ug/kg/min | INTRAVENOUS | Status: DC
  Administered 2015-12-28: 40 ug/kg/min via INTRAVENOUS
  Filled 2015-12-28 (×2): qty 100

## 2015-12-28 MED ORDER — DEXTROSE 5 % IV SOLN
INTRAVENOUS | Status: DC
  Administered 2015-12-28: 01:00:00 via INTRAVENOUS

## 2015-12-28 MED ORDER — DEXTROSE 5 % IV SOLN
5.0000 mg/h | INTRAVENOUS | Status: DC
  Administered 2015-12-28: 5 mg/h via INTRAVENOUS
  Filled 2015-12-28: qty 25

## 2015-12-28 MED ORDER — MAGNESIUM SULFATE 2 GM/50ML IV SOLN
2.0000 g | Freq: Once | INTRAVENOUS | Status: DC
  Filled 2015-12-28: qty 50

## 2015-12-28 MED ORDER — DEXTROSE 50 % IV SOLN
INTRAVENOUS | Status: AC
  Administered 2015-12-28: 25 mL
  Filled 2015-12-28: qty 50

## 2015-12-28 MED ORDER — LORAZEPAM BOLUS VIA INFUSION
2.0000 mg | INTRAVENOUS | Status: DC | PRN
  Administered 2015-12-28: 2 mg via INTRAVENOUS
  Filled 2015-12-28 (×2): qty 5

## 2015-12-29 NOTE — Discharge Summary (Addendum)
[facility] [place] [facility]   PATIENT NAME: Karen Rich    MR#:  [id number]  DATE OF BIRTH:  1976-11-04  DATE OF ADMISSION:  December 28, 2015 ADMITTING PHYSICIAN: Alford Highland DEATH DATE: 30-Dec-2015 PRIMARY CARE PHYSICIAN: Mosetta Pigeon, MD    ADMISSION DIAGNOSIS:  1. PEA cardiopulmonary arrest. 2. Clinical sepsis, pneumonia  3. Acute encephalopathy DISCHARGE DIAGNOSIS:  1. Acute respiratory failure with hypoxia, with PEA cardiopulmonary arrest.  2. Sepsis, multifocal pneumonia 3. Acute encephalopathy, consistent with the possibility of significant brain injury.  SECONDARY DIAGNOSIS:   Past Medical History  Diagnosis Date  . Edema   . Hyperlipidemia   . Hyperglycemia   . Fatigue   . Hypertension   . Diabetes mellitus without complication (HCC)   . ESRD on hemodialysis (HCC)     T,T,S  . Chronic diastolic CHF (congestive heart failure) (HCC)     HOSPITAL COURSE:   1. Acute respiratory failure with hypoxia, with PEA cardiopulmonary arrest.  troponin is negative. Off heparin drip due to bloody fluid from OG tube. off hypothermia protocol, was on vent.   2. Sepsis, multifocal pneumonia, leukocytosis and tachycardia.  She was treated with Zosyn and vancomycin.  3. Acute encephalopathy. 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  a markedly abnormal electroencephalogram, consistent with the possibility of significant brain injury.   4. End-stage renal disease on dialysis Tuesday Thursday and Saturday.   5. Hypotension. Improved, off Levophed drip 6. Type 2 diabetes, on sliding-scale 7. History of stridor 8. Chronic diastolic congestive heart failure. Hod HF meds per Cardiology.  * Hypokalemia. Improved with KCl supplement. * Anemia of chronic disease. Hb down to 7.2, and up to 7.7. * Diarrhea. Pt had a lot diarrhea last night. But CDT is negative.  * GIB. Bloody fluid from OGT. Treated with protonix iv bid.   The patient's sister (POA)  and brother decided terminal extubation after discussion with Dr. Nicholos Johns. The patient was made DNR. She was terminally extubated and expirted at 6:13 pm, December 30, 2015. DISCHARGE CONDITIONS:   Expired.   Shaune Pollack M.D on 12/29/2015 at 8:03 PM  Between 7am to 6pm - Pager - [phone]  After 6pm go to [email or web address] - password [email or web address] [facility]  [facility]  Office  [phone]  CC: Primary care physician; Mosetta Pigeon, MD

## 2016-01-01 LAB — CULTURE, BLOOD (ROUTINE X 2)
CULTURE: NO GROWTH
Culture: NO GROWTH

## 2016-01-03 ENCOUNTER — Telehealth: Payer: Self-pay | Admitting: *Deleted

## 2016-01-03 NOTE — Telephone Encounter (Signed)
Received records request Disability Determination Services , forwarded to [facility] for processing 01/03/16.

## 2016-01-09 ENCOUNTER — Ambulatory Visit: Payer: Self-pay | Admitting: Family Medicine

## 2016-01-09 NOTE — Progress Notes (Signed)
Pt sedation turned off per [employer] protocol.

## 2016-01-09 NOTE — Progress Notes (Signed)
   12/29/2015 1615  Clinical Encounter Type  Visited With Patient and family together  Visit Type Follow-up  Referral From Nurse  Consult/Referral To Chaplain  Spiritual Encounters  Spiritual Needs Grief support;Prayer  Stress Factors  Patient Stress Factors Exhausted;Health changes;Loss;Major life changes  Family Stress Factors Exhausted;Family relationships;Loss  Met w/patient & family for end of life care. Chap. Danny G. Nobles, ext. [phone] 

## 2016-01-09 NOTE — Progress Notes (Signed)
Discussed with pt's sister (POA) and brother, who decided terminal extubation after discussion with Dr. Nicholos Johns. The patient is made DNR. She will be terminally extubated.

## 2016-01-09 NOTE — Progress Notes (Signed)
Pt transported to CT for head scan.  Returned to room.  NAD noted.  Pt remains intubated and sedated.  Breath sounds equal bilaterally.

## 2016-01-09 NOTE — Progress Notes (Signed)
Subjective: Patient remains unresponsive.  Nimbex stopped at 4AM.  Fentanyl and Diprovan stopped about 30 minutes ago.    Objective: Current vital signs: BP 99/57 mmHg  Pulse 84  Temp(Src) 98.4 F (36.9 C) (Core (Comment))  Resp 15  Wt 74.889 kg (165 lb 1.6 oz)  SpO2 100%  LMP  (LMP Unknown) Vital signs in last 24 hours: Temp:  [91.4 F (33 C)-98.4 F (36.9 C)] 98.4 F (36.9 C) (01/20 0700) Pulse Rate:  [59-98] 84 (01/20 0900) Resp:  [14-21] 15 (01/20 0900) BP: (93-229)/(54-208) 99/57 mmHg (01/20 0900) SpO2:  [94 %-100 %] 100 % (01/20 0900) FiO2 (%):  [30 %-35 %] 30 % (01/20 0819)  Intake/Output from previous day: 01/19 0701 - 01/20 0700 In: 3720.2 [I.V.:3140.2; NG/GT:30; IV Piggyback:550] Out: 470 [Urine:35; Emesis/NG output:160; Stool:275] Intake/Output this shift:   Nutritional status: Diet NPO time specified  Neurologic Exam: Mental Status: Patient does not respond to verbal stimuli.  Does not respond to deep sternal rub.  Does not follow commands.  No verbalizations noted. Grimaced expression on face. Cranial Nerves: II: patient does not respond confrontation bilaterally, pupils right 2 mm, left 2 mm,and reactive bilaterally III,IV,VI: doll's response absent bilaterally.  V,VII: corneal reflex absent bilaterally  VIII: patient does not respond to verbal stimuli IX,X: gag reflex unable to test due to ETT, XI: trapezius strength unable to test bilaterally XII: tongue strength unable to test Motor: Arms flexed with posturing noted intermittently.  No purposeful movements noted. Sensory: Does not respond to noxious stimuli in any extremity.   Lab Results: Basic Metabolic Panel:  Recent Labs Lab 12/31/2015 1854  12/27/15 0854 12/27/15 1141 12/27/15 1742 12/27/15 2247 12/20/2015 0250 12/16/2015 0432  NA 136  < >  --  135 135 139 132* 133*  K 2.9*  < >  --  2.9* 3.3* 3.1* 3.0* 3.2*  CL 104  < >  --  98* 97* 99* 94* 94*  CO2 23  < >  --  28 29 32 29 31  GLUCOSE  200*  < >  --  217* 154* 107* 90 78  BUN 29*  < >  --  34* 36* 36* 35* 36*  CREATININE 4.42*  < >  --  4.76* 4.71* 4.65* 4.68* 4.89*  CALCIUM 9.5  < >  --  8.8* 8.5* 8.3* 8.0* 8.1*  MG 1.7  --  1.7  --   --   --  1.4*  --   PHOS  --   --   --  3.5  --   --   --   --   < > = values in this interval not displayed.  Liver Function Tests:  Recent Labs Lab 12/20/2015 1237  AST 51*  ALT 41  ALKPHOS 170*  BILITOT 0.8  PROT 6.7  ALBUMIN 2.6*   No results for input(s): LIPASE, AMYLASE in the last 168 hours. No results for input(s): AMMONIA in the last 168 hours.  CBC:  Recent Labs Lab 01/05/2016 1237 12/27/15 0220 12/27/15 1152 12/27/15 1742 01/05/2016 0432  WBC 19.0* 13.1* 10.9  --  13.3*  NEUTROABS 16.3*  --   --   --   --   HGB 8.2* 7.5* 7.2* 7.1* 7.7*  HCT 28.3* 23.7* 22.5*  --  24.1*  MCV 91.3 84.2 82.3  --  81.0  PLT 202 187 188  --  230    Cardiac Enzymes:  Recent Labs Lab 12/27/15 0005 12/27/15 0220 12/27/15 0854 12/27/15 1604  12/27/15 2037  TROPONINI <0.03 0.03 0.03 0.04* 0.03    Lipid Panel:  Recent Labs Lab 2016/01/17 0648  TRIG 35    CBG:  Recent Labs Lab 01/17/2016 0028 17-Jan-2016 0147 2016-01-17 0443 Jan 17, 2016 0747 01/17/16 0811  GLUCAP 80 77 74 53* 90    Microbiology: Results for orders placed or performed during the hospital encounter of 12/14/2015  CULTURE, BLOOD (ROUTINE X 2) w Reflex to PCR ID Panel     Status: None (Preliminary result)   Collection Time: 12/29/2015  5:07 PM  Result Value Ref Range Status   Specimen Description BLOOD RIGHT HAND  Final   Special Requests BOTTLES DRAWN AEROBIC AND ANAEROBIC  Final   Culture NO GROWTH 1 DAY  Final   Report Status PENDING  Incomplete  CULTURE, BLOOD (ROUTINE X 2) w Reflex to PCR ID Panel     Status: None (Preliminary result)   Collection Time: 12/30/2015  5:07 PM  Result Value Ref Range Status   Specimen Description BLOOD RIGHT HAND  Final   Special Requests BOTTLES DRAWN AEROBIC AND  ANAEROBIC  Final   Culture NO GROWTH 1 DAY  Final   Report Status PENDING  Incomplete  C difficile quick scan w PCR reflex     Status: None   Collection Time: 01/04/2016  5:10 PM  Result Value Ref Range Status   C Diff antigen NEGATIVE NEGATIVE Final   C Diff toxin NEGATIVE NEGATIVE Final   C Diff interpretation Negative for C. difficile  Final  MRSA PCR Screening     Status: None   Collection Time: 12/21/2015  5:10 PM  Result Value Ref Range Status   MRSA by PCR NEGATIVE NEGATIVE Final    Comment:        The GeneXpert MRSA Assay (FDA approved for NASAL specimens only), is one component of a comprehensive MRSA colonization surveillance program. It is not intended to diagnose MRSA infection nor to guide or monitor treatment for MRSA infections.   Gastrointestinal Panel by PCR , Stool     Status: None   Collection Time: 12/27/15 11:41 AM  Result Value Ref Range Status   Campylobacter species NOT DETECTED NOT DETECTED Final   Plesimonas shigelloides NOT DETECTED NOT DETECTED Final   Salmonella species NOT DETECTED NOT DETECTED Final   Yersinia enterocolitica NOT DETECTED NOT DETECTED Final   Vibrio species NOT DETECTED NOT DETECTED Final   Vibrio cholerae NOT DETECTED NOT DETECTED Final   Enteroaggregative E coli (EAEC) NOT DETECTED NOT DETECTED Final   Enteropathogenic E coli (EPEC) NOT DETECTED NOT DETECTED Final   Enterotoxigenic E coli (ETEC) NOT DETECTED NOT DETECTED Final   Shiga like toxin producing E coli (STEC) NOT DETECTED NOT DETECTED Final   E. coli O157 NOT DETECTED NOT DETECTED Final   Shigella/Enteroinvasive E coli (EIEC) NOT DETECTED NOT DETECTED Final   Cryptosporidium NOT DETECTED NOT DETECTED Final   Cyclospora cayetanensis NOT DETECTED NOT DETECTED Final   Entamoeba histolytica NOT DETECTED NOT DETECTED Final   Giardia lamblia NOT DETECTED NOT DETECTED Final   Adenovirus F40/41 NOT DETECTED NOT DETECTED Final   Astrovirus NOT DETECTED NOT DETECTED Final    Norovirus GI/GII NOT DETECTED NOT DETECTED Final   Rotavirus A NOT DETECTED NOT DETECTED Final   Sapovirus (I, II, IV, and V) NOT DETECTED NOT DETECTED Final    Coagulation Studies:  Recent Labs  12/12/2015 1929 12/30/2015 2147 12/27/15 0220 12/27/15 1742  LABPROT 21.0* 19.7* 19.0* 19.2*  INR  1.82 1.67 1.59 1.61    Imaging: Dg Chest 1 View  01-21-16  CLINICAL DATA:  Shortness of breath . EXAM: CHEST 1 VIEW COMPARISON:  12/27/2015. FINDINGS: Endotracheal tube, NG tube, right IJ line, and left IJ dialysis catheter in stable position. Mediastinum hilar structures are unremarkable. Cardiomegaly. Interim slight improvement of bibasilar atelectasis and/or infiltrates. Small pleural effusions cannot be excluded. No pneumothorax . IMPRESSION: 1. Lines and tubes in stable position. 2. Unchanged cardiomegaly. No pulmonary venous congestion. Interim clearing of pulmonary interstitial edema. Small pleural effusions the cannot be excluded . 3. Persistent but slightly improved basilar atelectasis . Electronically Signed   By: Maisie Fus  Register   On: 01/21/2016 07:45   Dg Chest 1 View  01/05/2016  CLINICAL DATA:  Central catheter placement EXAM: CHEST 1 VIEW COMPARISON:  Study obtained earlier in the day FINDINGS: There is a new right-sided central catheter with the tip just beyond the cavoatrial junction in the right atrium. Left-sided central catheter tip is at the cavoatrial junction. Endotracheal tube tip is 1.3 cm above the carina. Nasogastric tube tip and side port are below the diaphragm. No pneumothorax. Airspace consolidation in both upper lobes is present, more severe on the right than on the left, not significantly changed. No new opacity. Heart prominent but stable. There is apparent calcification in the left carotid artery. IMPRESSION: Tube and catheter positions as described without pneumothorax. Note that the endotracheal tube tip is 1.3 cm above the carina. It may be prudent to consider  withdrawing endotracheal tube approximately 2 cm. Airspace opacity in both upper lobes is again noted. Suspect multifocal pneumonia, although pulmonary edema could present in this manner. Cardiac prominence is stable. Apparent calcification left carotid artery. Electronically Signed   By: Bretta Bang III M.D.   On: 01/06/2016 16:05   Ct Head Wo Contrast  01-21-2016  CLINICAL DATA:  Anoxic brain damage.  Cardiac arrest 12/18/2015. EXAM: CT HEAD WITHOUT CONTRAST TECHNIQUE: Contiguous axial images were obtained from the base of the skull through the vertex without intravenous contrast. COMPARISON:  CT head 12/21/2015 FINDINGS: Loss of gray-white differentiation diffusely and bilaterally. Effacement of the sulci diffusely has progressed. Findings suggest diffuse brain edema. Ventricles are slightly smaller due to brain edema. No hydrocephalus. Hypodensity in the base ganglia bilaterally have progressed since likely due to acute ischemia. Negative for hemorrhage. Negative for mass lesion. No shift of the midline structures. Calvarium intact. 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 No hemorrhage. Critical Value/emergent results were called by telephone at the time of interpretation on 2016/01/21 at 9:14 am to Dr. Nicholos Johns, who verbally acknowledged these results. Electronically Signed   By: Marlan Palau M.D.   On: 01/21/2016 09:15   Ct Head Wo Contrast  12/10/2015  CLINICAL DATA:  Status post cardiopulmonary arrest today. History was not able to be obtained. Initial encounter. EXAM: CT HEAD WITHOUT CONTRAST TECHNIQUE: Contiguous axial images were obtained from the base of the skull through the vertex without intravenous contrast. COMPARISON:  Head CT scan 10/18/2015. FINDINGS: The brain appears normal without hemorrhage, infarct, mass lesion, mass effect or midline shift. No hemorrhage or abnormal extra-axial fluid collection  is identified. Gray-white differentiation is maintained. The calvarium is intact. Imaged paranasal sinuses and mastoid air cells are clear. IMPRESSION: Negative head CT. Electronically Signed   By: Drusilla Kanner M.D.   On: 12/16/2015 15:06   Dg Chest Port 1 View  12/27/2015  CLINICAL DATA:  PNA, Pt intubated, hx CHF, DM, HTN, dialysis pt, nonsmoker, pt unable to follow breathing instructions EXAM: PORTABLE CHEST 1 VIEW COMPARISON:  2016-01-23 FINDINGS: Endotracheal tube is in place with tip estimated to be in 4.8 cm above the carina. Right IJ central line tip overlies the level of superior vena cava. Nasogastric tube is in place, tip beyond the gastroesophageal junction and off the film. Left IJ central line tip overlies the level of superior vena cava. Heart is enlarged. There has been a shift in the pattern of infiltrates, now predominantly involving the left lung base and diffusely involving the right lung. The diaphragms are partially or fully obscured. IMPRESSION: Significant airspace filling opacity with decreased aeration at the bases. Findings are consistent with pulmonary edema and/or infectious process. Electronically Signed   By: Norva Pavlov M.D.   On: 12/27/2015 13:14   Dg Chest Portable 1 View  01/23/2016  CLINICAL DATA:  Unresponsive.  Status post intubation. EXAM: PORTABLE CHEST 1 VIEW COMPARISON:  October 20, 2015. FINDINGS: Stable cardiomegaly. Endotracheal tube is noted with distal tip 2.5 cm above the carina. No pneumothorax or pleural effusion is noted. Left internal jugular dialysis catheter is noted. Left lung is clear. Large right upper lobe airspace opacity is noted concerning for pneumonia or edema. Nasogastric tube is seen entering stomach. IMPRESSION: Endotracheal and nasogastric tubes are in grossly good position. Large right upper lobe airspace opacity is noted concerning for pneumonia or asymmetric edema. Electronically Signed   By: Lupita Raider, M.D.   On: 2016/01/23  13:23   Dg Abd Portable 1v  12/27/2015  CLINICAL DATA:  Abdominal distension, excessive stool EXAM: PORTABLE ABDOMEN - 1 VIEW COMPARISON:  None. FINDINGS: There is a relative paucity of bowel gas. There is no evidence of pneumoperitoneum, portal venous gas or pneumatosis. There are no pathologic calcifications along the expected course of the ureters. The osseous structures are unremarkable. IMPRESSION: Relative paucity of bowel gas which may reflect decompressed bowel versus fluid-filled bowel. Electronically Signed   By: Elige Ko   On: 12/27/2015 13:14    Medications:  I have reviewed the patient's current medications. Scheduled: . antiseptic oral rinse  7 mL Mouth Rinse 10 times per day  . artificial tears   Both Eyes 3 times per day  . chlorhexidine gluconate  15 mL Mouth Rinse BID  . insulin aspart  0-15 Units Subcutaneous 6 times per day  . insulin glargine  8 Units Subcutaneous Daily  . ipratropium-albuterol  3 mL Nebulization Q6H  . pantoprazole (PROTONIX) IV  40 mg Intravenous Q12H  . piperacillin-tazobactam (ZOSYN)  IV  3.375 g Intravenous Q12H  . sodium chloride  10-40 mL Intracatheter Q12H  . sodium chloride  3 mL Intravenous Q12H  . vancomycin  750 mg Intravenous Q T,Th,Sa-HD    Assessment/Plan: Neurological examination remains poor despite discontinuation of Nimbex, Fentanyl and Diprovan.  Normothermic since 0400. Posturing noted.  Head CT repeated this morning and personally reviewed.  It shows diffuse edema consistent with diffuse anoxic injury.  Prognosis is poor.      LOS: 2 days   Thana Farr, MD Neurology [phone] 12/17/2015  9:55 AM

## 2016-01-09 NOTE — Progress Notes (Signed)
ANTIBIOTIC CONSULT NOTE - INITIAL  Pharmacy Consult for vancomycin & piperacillin/tazobactam Indication: pneumonia  No Known Allergies  Patient Measurements: Weight: 165 lb 1.6 oz (74.889 kg) Adjusted Body Weight: 61 kg  Vital Signs: Temp: 98.4 F (36.9 C) (01/20 0700) Temp Source: Core (Comment) (01/20 0700) BP: 217/194 mmHg (01/20 1000) Pulse Rate: 88 (01/20 1000)  Labs:  Recent Labs  12/27/15 0220  12/27/15 1152 12/27/15 1742  2015/12/29 0250 Dec 29, 2015 0432 29-Dec-2015 1004  WBC 13.1*  --  10.9  --   --   --  13.3*  --   HGB 7.5*  --  7.2* 7.1*  --   --  7.7*  --   PLT 187  --  188  --   --   --  230  --   CREATININE 4.57*  < >  --  4.71*  < > 4.68* 4.89* 5.06*  < > = values in this interval not displayed. Estimated Creatinine Clearance: 14.2 mL/min (by C-G formula based on Cr of 5.06). No results for input(s): VANCOTROUGH, VANCOPEAK, VANCORANDOM, GENTTROUGH, GENTPEAK, GENTRANDOM, TOBRATROUGH, TOBRAPEAK, TOBRARND, AMIKACINPEAK, AMIKACINTROU, AMIKACIN in the last 72 hours.   Microbiology: Recent Results (from the past 720 hour(s))  CULTURE, BLOOD (ROUTINE X 2) w Reflex to PCR ID Panel     Status: None (Preliminary result)   Collection Time: 01/05/2016  5:07 PM  Result Value Ref Range Status   Specimen Description BLOOD RIGHT HAND  Final   Special Requests BOTTLES DRAWN AEROBIC AND ANAEROBIC  Final   Culture NO GROWTH 1 DAY  Final   Report Status PENDING  Incomplete  CULTURE, BLOOD (ROUTINE X 2) w Reflex to PCR ID Panel     Status: None (Preliminary result)   Collection Time: 12/27/2015  5:07 PM  Result Value Ref Range Status   Specimen Description BLOOD RIGHT HAND  Final   Special Requests BOTTLES DRAWN AEROBIC AND ANAEROBIC  Final   Culture NO GROWTH 1 DAY  Final   Report Status PENDING  Incomplete  C difficile quick scan w PCR reflex     Status: None   Collection Time: 01/08/2016  5:10 PM  Result Value Ref Range Status   C Diff antigen NEGATIVE NEGATIVE Final    C Diff toxin NEGATIVE NEGATIVE Final   C Diff interpretation Negative for C. difficile  Final  MRSA PCR Screening     Status: None   Collection Time: 12/25/2015  5:10 PM  Result Value Ref Range Status   MRSA by PCR NEGATIVE NEGATIVE Final    Comment:        The GeneXpert MRSA Assay (FDA approved for NASAL specimens only), is one component of a comprehensive MRSA colonization surveillance program. It is not intended to diagnose MRSA infection nor to guide or monitor treatment for MRSA infections.   Gastrointestinal Panel by PCR , Stool     Status: None   Collection Time: 12/27/15 11:41 AM  Result Value Ref Range Status   Campylobacter species NOT DETECTED NOT DETECTED Final   Plesimonas shigelloides NOT DETECTED NOT DETECTED Final   Salmonella species NOT DETECTED NOT DETECTED Final   Yersinia enterocolitica NOT DETECTED NOT DETECTED Final   Vibrio species NOT DETECTED NOT DETECTED Final   Vibrio cholerae NOT DETECTED NOT DETECTED Final   Enteroaggregative E coli (EAEC) NOT DETECTED NOT DETECTED Final   Enteropathogenic E coli (EPEC) NOT DETECTED NOT DETECTED Final   Enterotoxigenic E coli (ETEC) NOT DETECTED NOT DETECTED Final  Shiga like toxin producing E coli (STEC) NOT DETECTED NOT DETECTED Final   E. coli O157 NOT DETECTED NOT DETECTED Final   Shigella/Enteroinvasive E coli (EIEC) NOT DETECTED NOT DETECTED Final   Cryptosporidium NOT DETECTED NOT DETECTED Final   Cyclospora cayetanensis NOT DETECTED NOT DETECTED Final   Entamoeba histolytica NOT DETECTED NOT DETECTED Final   Giardia lamblia NOT DETECTED NOT DETECTED Final   Adenovirus F40/41 NOT DETECTED NOT DETECTED Final   Astrovirus NOT DETECTED NOT DETECTED Final   Norovirus GI/GII NOT DETECTED NOT DETECTED Final   Rotavirus A NOT DETECTED NOT DETECTED Final   Sapovirus (I, II, IV, and V) NOT DETECTED NOT DETECTED Final    Assessment: Pharmacy consulted to dose vancomycin and piperacillin/tazobactam in this 40  year old female for pneumonia. Patient has a history of HF, HTN, ESRD on HD TTS and was brought in with cardiopulmonary arrest PEA in the field.   Goal of Therapy:  Vancomycin trough level 15-20 mcg/ml  Plan:  Measure antibiotic drug levels at steady state Follow up culture results   Will continue Zosyn 3.375 g EI q 12 hours.   Will continue with vancomycin 750 mg iv q HD starting 01/21. May need to order additional vancomycin doses if dialysis goes off TTS schedule.  Will need to f/u and order vancomycin level prior to the third dialysis session.   Pharmacy will continue to monitor, thank you for the consult.  Valentina Gu, PharmD Clinical Pharmacist 12/22/2015,12:45 PM

## 2016-01-09 NOTE — Progress Notes (Signed)
Sedation paused at request of Dr. Linward Natal.  After approximately 30 minutes, pts hypertensive ( 204/152) and is Decorticate posturing.  Neurologist at bedside to eval pt, confirms posturing.  Dr. Ardyth Man to see pt.  Confirms to restart sedation.

## 2016-01-09 NOTE — Progress Notes (Signed)
eLink Physician-Brief Progress Note Patient Name: Karen Rich DOB: May 11, 1976 MRN: [id number]   Date of Service  01/25/16  HPI/Events of Note  Hypomag and hypokalemia in the setting of ESRD  eICU Interventions  Plan: Replace potassium and mag     Intervention Category Intermediate Interventions: Electrolyte abnormality - evaluation and management  DETERDING,ELIZABETH 25-Jan-2016, 3:34 AM

## 2016-01-09 NOTE — Progress Notes (Signed)
Pt reached core temp of 36.0 C at 0355. Nimbex stopped per protocol. Will continue to assess.

## 2016-01-09 NOTE — Progress Notes (Signed)
Pt terminally extubated with family in hallway.  Pt given Bolus of of fentanyl, and  of Ativan.  Pt placed on Ativan infusion at /hr.  Fentayl gtt increased to .  Family at bedside to be with pt.

## 2016-01-09 NOTE — Progress Notes (Signed)
Nutrition Brief Note  Chart reviewed. Pt now transitioning to comfort care. NPO No further nutrition interventions warranted at this time.  Please re-consult as needed.   Romelle Starcher MS, RD, LDN [phone] Pager  [phone] Weekend/On-Call Pager

## 2016-01-09 NOTE — Progress Notes (Signed)
Patient: Karen Rich / Admit Date: 12/10/2015 / Date of Encounter: 01/16/16, 9:31 AM   Subjective: No acute events overnight.  She was rewarmed and she is off sedation. CT scan showed significant cerebral edema. Echocardiogram showed an EF of 50-55%.   Review of Systems: Review of Systems  Unable to perform ROS: intubated     Objective: Telemetry: NSR, 60's Physical Exam: Blood pressure 99/57, pulse 84, temperature 98.4 F (36.9 C), temperature source Core (Comment), resp. rate 15, weight 165 lb 1.6 oz (74.889 kg), SpO2 100 %. Body mass index is 29.25 kg/(m^2). General: Critically ill appearing. Head: Normocephalic, atraumatic, sclera non-icteric, no xanthomas, nares are without discharge. Neck: Negative for carotid bruits. JVP not elevated. Lungs: Decreased breath sounds bilaterally. Intubated. Heart: RRR S1 S2 without murmurs, rubs, or gallops.  Abdomen: Soft, non-tender, non-distended with normoactive bowel sounds. No rebound/guarding. Extremities: No clubbing or cyanosis. No edema.  Neuro: Intubated and sedated. Psych:  Intubated and sedated.   Intake/Output Summary (Last 24 hours) at January 16, 2016 0931 Last data filed at Jan 16, 2016 0600  Gross per 24 hour  Intake 3127.4 ml  Output    445 ml  Net 2682.4 ml    Inpatient Medications:  . antiseptic oral rinse  7 mL Mouth Rinse 10 times per day  . artificial tears   Both Eyes 3 times per day  . chlorhexidine gluconate  15 mL Mouth Rinse BID  . insulin aspart  0-15 Units Subcutaneous 6 times per day  . insulin glargine  8 Units Subcutaneous Daily  . ipratropium-albuterol  3 mL Nebulization Q6H  . pantoprazole (PROTONIX) IV  40 mg Intravenous Q12H  . piperacillin-tazobactam (ZOSYN)  IV  3.375 g Intravenous Q12H  . sodium chloride  10-40 mL Intracatheter Q12H  . sodium chloride  3 mL Intravenous Q12H  . vancomycin  750 mg Intravenous Q T,Th,Sa-HD   Infusions:  . dextrose 30 mL/hr at 01-16-16 0600  . fentaNYL infusion  INTRAVENOUS Stopped (01-16-16 0919)  . norepinephrine (LEVOPHED) Adult infusion Stopped (01/16/2016 0612)  . propofol (DIPRIVAN) infusion Stopped (16-Jan-2016 0919)    Labs:  Recent Labs  12/27/15 0854 12/27/15 1141  January 16, 2016 0250 01-16-16 0432  NA  --  135  < > 132* 133*  K  --  2.9*  < > 3.0* 3.2*  CL  --  98*  < > 94* 94*  CO2  --  28  < > 29 31  GLUCOSE  --  217*  < > 90 78  BUN  --  34*  < > 35* 36*  CREATININE  --  4.76*  < > 4.68* 4.89*  CALCIUM  --  8.8*  < > 8.0* 8.1*  MG 1.7  --   --  1.4*  --   PHOS  --  3.5  --   --   --   < > = values in this interval not displayed.  Recent Labs  12/12/2015 1237  AST 51*  ALT 41  ALKPHOS 170*  BILITOT 0.8  PROT 6.7  ALBUMIN 2.6*    Recent Labs  12/09/2015 1237  12/27/15 1152 12/27/15 1742 01/16/2016 0432  WBC 19.0*  < > 10.9  --  13.3*  NEUTROABS 16.3*  --   --   --   --   HGB 8.2*  < > 7.2* 7.1* 7.7*  HCT 28.3*  < > 22.5*  --  24.1*  MCV 91.3  < > 82.3  --  81.0  PLT  202  < > 188  --  230  < > = values in this interval not displayed.  Recent Labs  12/27/15 0220 12/27/15 0854 12/27/15 1604 12/27/15 2037  TROPONINI 0.03 0.03 0.04* 0.03   Invalid input(s): POCBNP No results for input(s): HGBA1C in the last 72 hours.   Weights: Filed Weights   29-Dec-2015 1329  Weight: 165 lb 1.6 oz (74.889 kg)     Radiology/Studies:  Dg Chest 1 View  12/23/2015  CLINICAL DATA:  Shortness of breath . EXAM: CHEST 1 VIEW COMPARISON:  12/27/2015. FINDINGS: Endotracheal tube, NG tube, right IJ line, and left IJ dialysis catheter in stable position. Mediastinum hilar structures are unremarkable. Cardiomegaly. Interim slight improvement of bibasilar atelectasis and/or infiltrates. Small pleural effusions cannot be excluded. No pneumothorax . IMPRESSION: 1. Lines and tubes in stable position. 2. Unchanged cardiomegaly. No pulmonary venous congestion. Interim clearing of pulmonary interstitial edema. Small pleural effusions the cannot be  excluded . 3. Persistent but slightly improved basilar atelectasis . Electronically Signed   By: Maisie Fus  Register   On: 12/30/2015 07:45   Dg Chest 1 View  12/29/15  CLINICAL DATA:  Central catheter placement EXAM: CHEST 1 VIEW COMPARISON:  Study obtained earlier in the day FINDINGS: There is a new right-sided central catheter with the tip just beyond the cavoatrial junction in the right atrium. Left-sided central catheter tip is at the cavoatrial junction. Endotracheal tube tip is 1.3 cm above the carina. Nasogastric tube tip and side port are below the diaphragm. No pneumothorax. Airspace consolidation in both upper lobes is present, more severe on the right than on the left, not significantly changed. No new opacity. Heart prominent but stable. There is apparent calcification in the left carotid artery. IMPRESSION: Tube and catheter positions as described without pneumothorax. Note that the endotracheal tube tip is 1.3 cm above the carina. It may be prudent to consider withdrawing endotracheal tube approximately 2 cm. Airspace opacity in both upper lobes is again noted. Suspect multifocal pneumonia, although pulmonary edema could present in this manner. Cardiac prominence is stable. Apparent calcification left carotid artery. Electronically Signed   By: Bretta Bang III M.D.   On: 12/29/2015 16:05   Ct Head Wo Contrast  12/27/2015  CLINICAL DATA:  Anoxic brain damage.  Cardiac arrest 12-29-15. EXAM: CT HEAD WITHOUT CONTRAST TECHNIQUE: Contiguous axial images were obtained from the base of the skull through the vertex without intravenous contrast. COMPARISON:  CT head 12/29/15 FINDINGS: Loss of gray-white differentiation diffusely and bilaterally. Effacement of the sulci diffusely has progressed. Findings suggest diffuse brain edema. Ventricles are slightly smaller due to brain edema. No hydrocephalus. Hypodensity in the base ganglia bilaterally have progressed since likely due to acute ischemia.  Negative for hemorrhage. Negative for mass lesion. No shift of the midline structures. Calvarium intact. 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 No hemorrhage. Critical Value/emergent results were called by telephone at the time of interpretation on 12/29/2015 at 9:14 am to Dr. Nicholos Johns, who verbally acknowledged these results. Electronically Signed   By: Marlan Palau M.D.   On: 01/04/2016 09:15   Ct Head Wo Contrast  12/29/2015  CLINICAL DATA:  Status post cardiopulmonary arrest today. History was not able to be obtained. Initial encounter. EXAM: CT HEAD WITHOUT CONTRAST TECHNIQUE: Contiguous axial images were obtained from the base of the skull through the vertex without intravenous contrast. COMPARISON:  Head CT scan 10/18/2015. FINDINGS: The brain appears normal  without hemorrhage, infarct, mass lesion, mass effect or midline shift. No hemorrhage or abnormal extra-axial fluid collection is identified. Gray-white differentiation is maintained. The calvarium is intact. Imaged paranasal sinuses and mastoid air cells are clear. IMPRESSION: Negative head CT. Electronically Signed   By: Drusilla Kanner M.D.   On: 12-29-2015 15:06   Dg Chest Port 1 View  12/27/2015  CLINICAL DATA:  PNA, Pt intubated, hx CHF, DM, HTN, dialysis pt, nonsmoker, pt unable to follow breathing instructions EXAM: PORTABLE CHEST 1 VIEW COMPARISON:  2015/12/29 FINDINGS: Endotracheal tube is in place with tip estimated to be in 4.8 cm above the carina. Right IJ central line tip overlies the level of superior vena cava. Nasogastric tube is in place, tip beyond the gastroesophageal junction and off the film. Left IJ central line tip overlies the level of superior vena cava. Heart is enlarged. There has been a shift in the pattern of infiltrates, now predominantly involving the left lung base and diffusely involving the right lung. The diaphragms are  partially or fully obscured. IMPRESSION: Significant airspace filling opacity with decreased aeration at the bases. Findings are consistent with pulmonary edema and/or infectious process. Electronically Signed   By: Norva Pavlov M.D.   On: 12/27/2015 13:14   Dg Chest Portable 1 View  12-29-15  CLINICAL DATA:  Unresponsive.  Status post intubation. EXAM: PORTABLE CHEST 1 VIEW COMPARISON:  October 20, 2015. FINDINGS: Stable cardiomegaly. Endotracheal tube is noted with distal tip 2.5 cm above the carina. No pneumothorax or pleural effusion is noted. Left internal jugular dialysis catheter is noted. Left lung is clear. Large right upper lobe airspace opacity is noted concerning for pneumonia or edema. Nasogastric tube is seen entering stomach. IMPRESSION: Endotracheal and nasogastric tubes are in grossly good position. Large right upper lobe airspace opacity is noted concerning for pneumonia or asymmetric edema. Electronically Signed   By: Lupita Raider, M.D.   On: 12/29/2015 13:23   Dg Abd Portable 1v  12/27/2015  CLINICAL DATA:  Abdominal distension, excessive stool EXAM: PORTABLE ABDOMEN - 1 VIEW COMPARISON:  None. FINDINGS: There is a relative paucity of bowel gas. There is no evidence of pneumoperitoneum, portal venous gas or pneumatosis. There are no pathologic calcifications along the expected course of the ureters. The osseous structures are unremarkable. IMPRESSION: Relative paucity of bowel gas which may reflect decompressed bowel versus fluid-filled bowel. Electronically Signed   By: Elige Ko   On: 12/27/2015 13:14     Assessment and Plan   1. Acute respiratory failure with hypoxia with history of cardiac arrest found to be in PEA s/p ROSC: -Currently intubated, wean as able per PCCM -Likely in the setting of multifocal PNA, though cannot rule out acute ischemic event at this time currently -CXR shows multifocal PNA - Echo showed low normal LV systolic function. -Troponin  negative x 3 - This event does not seem to be cardiac. Overall prognosis is poor.  2. Septic shock: -Likely in the setting of the above -On vancomycin and Zosyn per IM -WBC 19,000-->13,000   3. ESRD on HD: -Volume management by Renal -Has not missed any HD sesions -Renal on board  4. Hypotension: -As above  5. Chronic diastolic CHF: -Hold HF medications in the setting of the above  6. Encephalopathy: -No neurological response   Will sign off. Please call with questions.    Signed, Lorine Bears, MD   12/29/2015, 9:31 AM

## 2016-01-09 NOTE — Progress Notes (Signed)
Pt seen to have decorticate posturing bilaterally in the upper extremities, and possible seizure like movements. Pt's BP also increased to 202/178.  Propofol restarted at 86mcg/kg/min and Fentanyl was restarted at 26mcg/hr.  Levophed paused.   BP recycled at 0620 and found at 229/208.   Pupils remain 3 fixed and irregular. Pt did not respond to voice. Pola Corn notified.  New orders given.

## 2016-01-09 NOTE — Progress Notes (Signed)
Spoke with [facility] Family specialist Thereasa Distance.  He reports that medical director has been informed of patients history and present situation and does not feel like donation is appropriate for this patient.  Will call [facility] with time of death, or with further changes.

## 2016-01-09 NOTE — Progress Notes (Signed)
Chaplain rounded in the unit and offered a compassionate presence and support to the patient.  Said silent prayer. Chaplain Billy Rocco [phone] 

## 2016-01-09 NOTE — Progress Notes (Signed)
eLink Physician-Brief Progress Note Patient Name: Karen Rich DOB: 1976-07-14 MRN: [id number]   Date of Service  29-Dec-2015  HPI/Events of Note  Call from bedside nurse reporting that once the sedation and paralytic were d/ced per hypothermia protocol the patient's BP increased to greater than 200 and patient exhibited spasmotic movements suggestive of myoclonus and/or seizures.    eICU Interventions  Plan: NE gtt d/ced due to hyptertension Fentanyl gtt with bolus Restart of propofol gtt Consider neuro consult     Intervention Category Major Interventions: Other: Intermediate Interventions: Hypotension - evaluation and management  DETERDING,ELIZABETH 12-29-2015, 6:34 AM

## 2016-01-09 NOTE — Progress Notes (Signed)
Pt passed on monitor with family at bedside.  Confirmed by Melina Copa.  Pt requesting [facility] for patients arrangements. [facility] called and informed of Cardiac death at their request.  Spoke with Derl Barrow.  Who reports pt is suitable for Eye and tissue donation.  Her referal number is #[phone]

## 2016-01-09 NOTE — Progress Notes (Signed)
[facility] Critical Care Medicine Progess Note    ASSESSMENT/PLAN    40 yo female with ESRD, diastolic/systolic CHF now with PEA arrest with prolonged downtime. completed  hypothermia protocol.   NEUROLOGIC A: Encephalopathy/Coma secondary to cardiac arrest.  P:  --CT head  reviewed with radiologist; showed cerebral edema; basal ganglia infarcts.  --neuro exam today, no DTR elicited. No corneal reflexes, initiates a few gasps when disconnected from the ventilator for 1 minute; no gag, no pupillary responses, she does have some cough with deep suctioning.   Discussed with family at bedside, including PoA. Her prognosis is grim, she is unlikely to survive. She does not meet criteria for brain death at this time. Recommend terminal extubation, they are in agreement and but would like to wait for later today. Meanwhile, the patient will be made DNR.  [facility] is permitted to speak with family.    PULMONARY  A:VDRF due to cardiac arrest.  P:  Not able to wean due to anoxic encephalopathy.    CARDIOVASCULAR  A: Acute systolic and diastolic CHF.  Cardiogenic shock, s/p cardiac arrest.  P:  Continue levophed, fluid resuscitation.  Heparin infusion stopped due to blood in OG and stool.   RENAL A: ARF.  P:  HD per nephro.  Continue bicarb drip.     GASTROINTESTINAL A: D of uncertain etiology.  -Blood from OG tube, stopped after stopping heparin.   Gi prophylaxis. Repeat CBC, monitor Hb.   HEMATOLOGIC A: Leukocytosis, likely stress reaction.  --Transfuse as needed.   INFECTIOUS A: Will monitor cultures.   Zosyn 1/18>> Vancomycin 1/18>>  ENDOCRINE A: DM, continued hyperglycemia.  --Continue ssi coverage, added levemir.      MAJOR EVENTS/TEST RESULTS: Cardiac arrest 1/18.  Cardiac arrest 11/07 CT head 1/20; cerebral edema, basal ganglia infarcts.      --------------------------------------- -------------------   Name: TASHEIKA KITZMILLER MRN: [id number] DOB: 1976-04-27    ADMISSION DATE:  12-Jan-2016     SUBJECTIVE:   Pt currently on the ventilator, can not provide history or review of systems.  Marland Kitchen    VITAL SIGNS: Temp:  [91.4 F (33 C)-98.4 F (36.9 C)] 98.4 F (36.9 C) (01/20 0700) Pulse Rate:  [60-98] 84 (01/20 1200) Resp:  [14-24] 15 (01/20 1200) BP: (93-229)/(53-208) 100/53 mmHg (01/20 1200) SpO2:  [94 %-100 %] 100 % (01/20 1307) FiO2 (%):  [30 %-35 %] 30 % (01/20 1307) HEMODYNAMICS: CVP:  [6 mmHg-14 mmHg] 8 mmHg VENTILATOR SETTINGS: Vent Mode:  [-] PRVC FiO2 (%):  [30 %-35 %] 30 % Set Rate:  [14 bmp] 14 bmp Vt Set:  [500 mL] 500 mL PEEP:  [5 cmH20] 5 cmH20 Plateau Pressure:  [22 cmH20] 22 cmH20 INTAKE / OUTPUT:  Intake/Output Summary (Last 24 hours) at 12/19/2015 1327 Last data filed at 01/07/2016 0600  Gross per 24 hour  Intake 2343.6 ml  Output    445 ml  Net 1898.6 ml    PHYSICAL EXAMINATION: Physical Examination:   VS: BP 100/53 mmHg  Pulse 84  Temp(Src) 98.4 F (36.9 C) (Core (Comment))  Resp 15  Wt 165 lb 1.6 oz (74.889 kg)  SpO2 100%  LMP  (LMP Unknown)  General Appearance: No distress  Neuro:nothing elicited,  HEENT: no pupillary responses  Pulmonary: normal breath sounds   CardiovascularNormal S1,S2.  No m/r/g.   Abdomen: Benign, Soft, non-tender. Renal:  No costovertebral tenderness  GU:  Not performed at this time. Endocrine: No evident thyromegaly. Skin:   warm, no rashes, no  ecchymosis  Extremities: normal, no cyanosis, clubbing.   LABS:   LABORATORY PANEL:   CBC  Recent Labs Lab 12/21/2015 0432  WBC 13.3*  HGB 7.7*  HCT 24.1*  PLT 230    Chemistries   Recent Labs Lab 01-18-2016 1237  12/27/15 1141  12/27/2015 0250  01/05/2016 1004  NA 137  < > 135  < > 132*  < > 136  K 4.5  < > 2.9*  < > 3.0*  < > 4.0  CL 105  < > 98*  < > 94*  < > 96*  CO2 22  < > 28  < > 29   < > 30  GLUCOSE 149*  < > 217*  < > 90  < > 76  BUN 23*  < > 34*  < > 35*  < > 40*  CREATININE 4.36*  < > 4.76*  < > 4.68*  < > 5.06*  CALCIUM 12.3*  < > 8.8*  < > 8.0*  < > 8.3*  MG  --   < >  --   --  1.4*  --   --   PHOS  --   --  3.5  --   --   --   --   AST 51*  --   --   --   --   --   --   ALT 41  --   --   --   --   --   --   ALKPHOS 170*  --   --   --   --   --   --   BILITOT 0.8  --   --   --   --   --   --   < > = values in this interval not displayed.   Recent Labs Lab 12/09/2015 0147 12/31/2015 0443 01/03/2016 0747 12/27/2015 0811 12/14/2015 1131 12/11/2015 1251  GLUCAP 77 74 53* 90 63* 86    Recent Labs Lab 01/18/2016 1600 12/27/15 0356  PHART 7.45 7.48*  PCO2ART 29* 39  PO2ART 57* 117*    Recent Labs Lab 01/18/2016 1237  AST 51*  ALT 41  ALKPHOS 170*  BILITOT 0.8  ALBUMIN 2.6*    Cardiac Enzymes  Recent Labs Lab 12/27/15 2037  TROPONINI 0.03    RADIOLOGY:  Dg Chest 1 View  12/27/2015  CLINICAL DATA:  Shortness of breath . EXAM: CHEST 1 VIEW COMPARISON:  12/27/2015. FINDINGS: Endotracheal tube, NG tube, right IJ line, and left IJ dialysis catheter in stable position. Mediastinum hilar structures are unremarkable. Cardiomegaly. Interim slight improvement of bibasilar atelectasis and/or infiltrates. Small pleural effusions cannot be excluded. No pneumothorax . IMPRESSION: 1. Lines and tubes in stable position. 2. Unchanged cardiomegaly. No pulmonary venous congestion. Interim clearing of pulmonary interstitial edema. Small pleural effusions the cannot be excluded . 3. Persistent but slightly improved basilar atelectasis . Electronically Signed   By: Maisie Fus  Register   On: 12/24/2015 07:45   Dg Chest 1 View  01-18-2016  CLINICAL DATA:  Central catheter placement EXAM: CHEST 1 VIEW COMPARISON:  Study obtained earlier in the day FINDINGS: There is a new right-sided central catheter with the tip just beyond the cavoatrial junction in the right atrium. Left-sided  central catheter tip is at the cavoatrial junction. Endotracheal tube tip is 1.3 cm above the carina. Nasogastric tube tip and side port are below the diaphragm. No pneumothorax. Airspace consolidation in both upper lobes is present, more severe on  the right than on the left, not significantly changed. No new opacity. Heart prominent but stable. There is apparent calcification in the left carotid artery. IMPRESSION: Tube and catheter positions as described without pneumothorax. Note that the endotracheal tube tip is 1.3 cm above the carina. It may be prudent to consider withdrawing endotracheal tube approximately 2 cm. Airspace opacity in both upper lobes is again noted. Suspect multifocal pneumonia, although pulmonary edema could present in this manner. Cardiac prominence is stable. Apparent calcification left carotid artery. Electronically Signed   By: Bretta Bang III M.D.   On: 09-Jan-2016 16:05   Ct Head Wo Contrast  01/04/2016  CLINICAL DATA:  Anoxic brain damage.  Cardiac arrest January 09, 2016. EXAM: CT HEAD WITHOUT CONTRAST TECHNIQUE: Contiguous axial images were obtained from the base of the skull through the vertex without intravenous contrast. COMPARISON:  CT head 2016/01/09 FINDINGS: Loss of gray-white differentiation diffusely and bilaterally. Effacement of the sulci diffusely has progressed. Findings suggest diffuse brain edema. Ventricles are slightly smaller due to brain edema. No hydrocephalus. Hypodensity in the base ganglia bilaterally have progressed since likely due to acute ischemia. Negative for hemorrhage. Negative for mass lesion. No shift of the midline structures. Calvarium intact. 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 No hemorrhage. Critical Value/emergent results were called by telephone at the time of interpretation on 12/24/2015 at 9:14 am to Dr. Nicholos Johns, who verbally acknowledged these results.  Electronically Signed   By: Marlan Palau M.D.   On: 12/09/2015 09:15   Ct Head Wo Contrast  09-Jan-2016  CLINICAL DATA:  Status post cardiopulmonary arrest today. History was not able to be obtained. Initial encounter. EXAM: CT HEAD WITHOUT CONTRAST TECHNIQUE: Contiguous axial images were obtained from the base of the skull through the vertex without intravenous contrast. COMPARISON:  Head CT scan 10/18/2015. FINDINGS: The brain appears normal without hemorrhage, infarct, mass lesion, mass effect or midline shift. No hemorrhage or abnormal extra-axial fluid collection is identified. Gray-white differentiation is maintained. The calvarium is intact. Imaged paranasal sinuses and mastoid air cells are clear. IMPRESSION: Negative head CT. Electronically Signed   By: Drusilla Kanner M.D.   On: 01/09/16 15:06   Dg Chest Port 1 View  12/27/2015  CLINICAL DATA:  PNA, Pt intubated, hx CHF, DM, HTN, dialysis pt, nonsmoker, pt unable to follow breathing instructions EXAM: PORTABLE CHEST 1 VIEW COMPARISON:  01/09/16 FINDINGS: Endotracheal tube is in place with tip estimated to be in 4.8 cm above the carina. Right IJ central line tip overlies the level of superior vena cava. Nasogastric tube is in place, tip beyond the gastroesophageal junction and off the film. Left IJ central line tip overlies the level of superior vena cava. Heart is enlarged. There has been a shift in the pattern of infiltrates, now predominantly involving the left lung base and diffusely involving the right lung. The diaphragms are partially or fully obscured. IMPRESSION: Significant airspace filling opacity with decreased aeration at the bases. Findings are consistent with pulmonary edema and/or infectious process. Electronically Signed   By: Norva Pavlov M.D.   On: 12/27/2015 13:14   Dg Abd Portable 1v  12/27/2015  CLINICAL DATA:  Abdominal distension, excessive stool EXAM: PORTABLE ABDOMEN - 1 VIEW COMPARISON:  None. FINDINGS: There is  a relative paucity of bowel gas. There is no evidence of pneumoperitoneum, portal venous gas or pneumatosis. There are no pathologic calcifications along the expected course of the ureters. The osseous structures  are unremarkable. IMPRESSION: Relative paucity of bowel gas which may reflect decompressed bowel versus fluid-filled bowel. Electronically Signed   By: Elige Ko   On: 12/27/2015 13:14       --Wells Guiles, MD.  Pager [phone] [facility]   Santiago Glad, M.D.  Stephanie Acre, M.D.  Billy Fischer, M.D  Critical Care Attestation.  I have personally obtained a history, examined the patient, evaluated laboratory and imaging results, formulated the assessment and plan and placed orders. The Patient requires high complexity decision making for assessment and support, frequent evaluation and titration of therapies, application of advanced monitoring technologies and extensive interpretation of multiple databases. The patient has critical illness that could lead imminently to failure of 1 or more organ systems and requires the highest level of physician preparedness to intervene.  Critical Care Time devoted to patient care services described in this note is 35 minutes and is exclusive of time spent in procedures.

## 2016-01-09 NOTE — Progress Notes (Signed)
Hypoglycemic Event CBG: 41   Treatment: D50 IV 25 mL  Symptoms: None Pt is sedated and on 1.5 Nimbex  Follow-up CBG: Time:0025 CBG Result:90  Possible Reasons for Event: Unknown  Comments/MD notified:1245     Vinnie Level

## 2016-01-09 NOTE — Progress Notes (Signed)
[facility]  ROUNDING NOTE   Subjective:  Patient was rewarmed yesterday. Earlier today she showed posturing again. She was then placed back on sedation. Also having loose stools.   Objective:  Vital signs in last 24 hours:  Temp:  [91.4 F (33 C)-98.4 F (36.9 C)] 98.4 F (36.9 C) (01/20 0700) Pulse Rate:  [59-98] 91 (01/20 0620) Resp:  [14-21] 15 (01/20 0620) BP: (93-229)/(54-208) 96/55 mmHg (01/20 0645) SpO2:  [94 %-100 %] 94 % (01/20 0610) FiO2 (%):  [30 %-45 %] 30 % (01/20 0700)  Weight change:  Filed Weights   01/02/2016 1329  Weight: 74.889 kg (165 lb 1.6 oz)    Intake/Output: I/O last 3 completed shifts: In: 5800.4 [I.V.:4720.4; NG/GT:30; IV Piggyback:1050] Out: 2670 [Urine:35; Emesis/NG output:160; Stool:2475]   Intake/Output this shift:     Physical Exam: General: Critically ill appearing  Head: Normocephalic, atraumatic. ETT in place  Eyes: Anicteric, pupils 4mm, not reacting  Neck: Supple, trachea midline  Lungs:  Clear to auscultation, vent assisted  Heart: S1S2 periods of irregularity  Abdomen:  Soft, nontender, BS present  Extremities:  2+ peripheral edema.  Neurologic: On sedation  Skin: Old excoriations on b/l LE's  Access: L IJ PC    Basic Metabolic Panel:  Recent Labs Lab 02-Jan-2016 1854  12/27/15 0854 12/27/15 1141 12/27/15 1742 12/27/15 2247 01/01/2016 0250 12/27/2015 0432  NA 136  < >  --  135 135 139 132* 133*  K 2.9*  < >  --  2.9* 3.3* 3.1* 3.0* 3.2*  CL 104  < >  --  98* 97* 99* 94* 94*  CO2 23  < >  --  28 29 32 29 31  GLUCOSE 200*  < >  --  217* 154* 107* 90 78  BUN 29*  < >  --  34* 36* 36* 35* 36*  CREATININE 4.42*  < >  --  4.76* 4.71* 4.65* 4.68* 4.89*  CALCIUM 9.5  < >  --  8.8* 8.5* 8.3* 8.0* 8.1*  MG 1.7  --  1.7  --   --   --  1.4*  --   PHOS  --   --   --  3.5  --   --   --   --   < > = values in this interval not displayed.  Liver Function Tests:  Recent Labs Lab 01-02-2016 1237  AST 51*  ALT 41   ALKPHOS 170*  BILITOT 0.8  PROT 6.7  ALBUMIN 2.6*   No results for input(s): LIPASE, AMYLASE in the last 168 hours. No results for input(s): AMMONIA in the last 168 hours.  CBC:  Recent Labs Lab January 02, 2016 1237 12/27/15 0220 12/27/15 1152 12/27/15 1742 12/12/2015 0432  WBC 19.0* 13.1* 10.9  --  13.3*  NEUTROABS 16.3*  --   --   --   --   HGB 8.2* 7.5* 7.2* 7.1* 7.7*  HCT 28.3* 23.7* 22.5*  --  24.1*  MCV 91.3 84.2 82.3  --  81.0  PLT 202 187 188  --  230    Cardiac Enzymes:  Recent Labs Lab 12/27/15 0005 12/27/15 0220 12/27/15 0854 12/27/15 1604 12/27/15 2037  TROPONINI <0.03 0.03 0.03 0.04* 0.03    BNP: Invalid input(s): POCBNP  CBG:  Recent Labs Lab 12/13/2015 0027 01/06/2016 0028 01/01/2016 0147 12/15/2015 0443 01/03/2016 0747  GLUCAP 99 80 77 74 53*    Microbiology: Results for orders placed or performed during the hospital encounter of 02-Jan-2016  CULTURE, BLOOD (ROUTINE X 2) w Reflex to PCR ID Panel     Status: None (Preliminary result)   Collection Time: 12/18/2015  5:07 PM  Result Value Ref Range Status   Specimen Description BLOOD RIGHT HAND  Final   Special Requests BOTTLES DRAWN AEROBIC AND ANAEROBIC  Final   Culture NO GROWTH 1 DAY  Final   Report Status PENDING  Incomplete  CULTURE, BLOOD (ROUTINE X 2) w Reflex to PCR ID Panel     Status: None (Preliminary result)   Collection Time: 12/16/2015  5:07 PM  Result Value Ref Range Status   Specimen Description BLOOD RIGHT HAND  Final   Special Requests BOTTLES DRAWN AEROBIC AND ANAEROBIC  Final   Culture NO GROWTH 1 DAY  Final   Report Status PENDING  Incomplete  C difficile quick scan w PCR reflex     Status: None   Collection Time: 12/27/2015  5:10 PM  Result Value Ref Range Status   C Diff antigen NEGATIVE NEGATIVE Final   C Diff toxin NEGATIVE NEGATIVE Final   C Diff interpretation Negative for C. difficile  Final  MRSA PCR Screening     Status: None   Collection Time: 12/22/2015  5:10 PM   Result Value Ref Range Status   MRSA by PCR NEGATIVE NEGATIVE Final    Comment:        The GeneXpert MRSA Assay (FDA approved for NASAL specimens only), is one component of a comprehensive MRSA colonization surveillance program. It is not intended to diagnose MRSA infection nor to guide or monitor treatment for MRSA infections.   Gastrointestinal Panel by PCR , Stool     Status: None   Collection Time: 12/27/15 11:41 AM  Result Value Ref Range Status   Campylobacter species NOT DETECTED NOT DETECTED Final   Plesimonas shigelloides NOT DETECTED NOT DETECTED Final   Salmonella species NOT DETECTED NOT DETECTED Final   Yersinia enterocolitica NOT DETECTED NOT DETECTED Final   Vibrio species NOT DETECTED NOT DETECTED Final   Vibrio cholerae NOT DETECTED NOT DETECTED Final   Enteroaggregative E coli (EAEC) NOT DETECTED NOT DETECTED Final   Enteropathogenic E coli (EPEC) NOT DETECTED NOT DETECTED Final   Enterotoxigenic E coli (ETEC) NOT DETECTED NOT DETECTED Final   Shiga like toxin producing E coli (STEC) NOT DETECTED NOT DETECTED Final   E. coli O157 NOT DETECTED NOT DETECTED Final   Shigella/Enteroinvasive E coli (EIEC) NOT DETECTED NOT DETECTED Final   Cryptosporidium NOT DETECTED NOT DETECTED Final   Cyclospora cayetanensis NOT DETECTED NOT DETECTED Final   Entamoeba histolytica NOT DETECTED NOT DETECTED Final   Giardia lamblia NOT DETECTED NOT DETECTED Final   Adenovirus F40/41 NOT DETECTED NOT DETECTED Final   Astrovirus NOT DETECTED NOT DETECTED Final   Norovirus GI/GII NOT DETECTED NOT DETECTED Final   Rotavirus A NOT DETECTED NOT DETECTED Final   Sapovirus (I, II, IV, and V) NOT DETECTED NOT DETECTED Final    Coagulation Studies:  Recent Labs  01/04/2016 1929 12/29/2015 2147 12/27/15 0220 12/27/15 1742  LABPROT 21.0* 19.7* 19.0* 19.2*  INR 1.82 1.67 1.59 1.61    Urinalysis:  Recent Labs  12/25/2015 1237  COLORURINE YELLOW*  LABSPEC 1.038*  PHURINE QUANTITY  NOT SUFFICIENT FOR TESTING  GLUCOSEU QUANTITY NOT SUFFICIENT  HGBUR QUANTITY NOT SUFFICIENT  BILIRUBINUR QUANTITY NOT SUFFICIENT  KETONESUR QUANTITY NOT SUFFICIENT  PROTEINUR QUANTITY NOT SUFFICIENT  NITRITE QUANTITY NOT SUFFICIENT  LEUKOCYTESUR QUANTITY NOT SUFFICIENT  Imaging: Dg Chest 1 View  January 03, 2016  CLINICAL DATA:  Shortness of breath . EXAM: CHEST 1 VIEW COMPARISON:  12/27/2015. FINDINGS: Endotracheal tube, NG tube, right IJ line, and left IJ dialysis catheter in stable position. Mediastinum hilar structures are unremarkable. Cardiomegaly. Interim slight improvement of bibasilar atelectasis and/or infiltrates. Small pleural effusions cannot be excluded. No pneumothorax . IMPRESSION: 1. Lines and tubes in stable position. 2. Unchanged cardiomegaly. No pulmonary venous congestion. Interim clearing of pulmonary interstitial edema. Small pleural effusions the cannot be excluded . 3. Persistent but slightly improved basilar atelectasis . Electronically Signed   By: Maisie Fus  Register   On: 2016-01-03 07:45   Dg Chest 1 View  12/31/2015  CLINICAL DATA:  Central catheter placement EXAM: CHEST 1 VIEW COMPARISON:  Study obtained earlier in the day FINDINGS: There is a new right-sided central catheter with the tip just beyond the cavoatrial junction in the right atrium. Left-sided central catheter tip is at the cavoatrial junction. Endotracheal tube tip is 1.3 cm above the carina. Nasogastric tube tip and side port are below the diaphragm. No pneumothorax. Airspace consolidation in both upper lobes is present, more severe on the right than on the left, not significantly changed. No new opacity. Heart prominent but stable. There is apparent calcification in the left carotid artery. IMPRESSION: Tube and catheter positions as described without pneumothorax. Note that the endotracheal tube tip is 1.3 cm above the carina. It may be prudent to consider withdrawing endotracheal tube approximately 2 cm.  Airspace opacity in both upper lobes is again noted. Suspect multifocal pneumonia, although pulmonary edema could present in this manner. Cardiac prominence is stable. Apparent calcification left carotid artery. Electronically Signed   By: Bretta Bang III M.D.   On: 12/11/2015 16:05   Ct Head Wo Contrast  12/11/2015  CLINICAL DATA:  Status post cardiopulmonary arrest today. History was not able to be obtained. Initial encounter. EXAM: CT HEAD WITHOUT CONTRAST TECHNIQUE: Contiguous axial images were obtained from the base of the skull through the vertex without intravenous contrast. COMPARISON:  Head CT scan 10/18/2015. FINDINGS: The brain appears normal without hemorrhage, infarct, mass lesion, mass effect or midline shift. No hemorrhage or abnormal extra-axial fluid collection is identified. Gray-white differentiation is maintained. The calvarium is intact. Imaged paranasal sinuses and mastoid air cells are clear. IMPRESSION: Negative head CT. Electronically Signed   By: Drusilla Kanner M.D.   On: 12/18/2015 15:06   Dg Chest Port 1 View  12/27/2015  CLINICAL DATA:  PNA, Pt intubated, hx CHF, DM, HTN, dialysis pt, nonsmoker, pt unable to follow breathing instructions EXAM: PORTABLE CHEST 1 VIEW COMPARISON:  12/21/2015 FINDINGS: Endotracheal tube is in place with tip estimated to be in 4.8 cm above the carina. Right IJ central line tip overlies the level of superior vena cava. Nasogastric tube is in place, tip beyond the gastroesophageal junction and off the film. Left IJ central line tip overlies the level of superior vena cava. Heart is enlarged. There has been a shift in the pattern of infiltrates, now predominantly involving the left lung base and diffusely involving the right lung. The diaphragms are partially or fully obscured. IMPRESSION: Significant airspace filling opacity with decreased aeration at the bases. Findings are consistent with pulmonary edema and/or infectious process. Electronically  Signed   By: Norva Pavlov M.D.   On: 12/27/2015 13:14   Dg Chest Portable 1 View  12/14/2015  CLINICAL DATA:  Unresponsive.  Status post intubation. EXAM: PORTABLE CHEST 1 VIEW COMPARISON:  October 20, 2015. FINDINGS: Stable cardiomegaly. Endotracheal tube is noted with distal tip 2.5 cm above the carina. No pneumothorax or pleural effusion is noted. Left internal jugular dialysis catheter is noted. Left lung is clear. Large right upper lobe airspace opacity is noted concerning for pneumonia or edema. Nasogastric tube is seen entering stomach. IMPRESSION: Endotracheal and nasogastric tubes are in grossly good position. Large right upper lobe airspace opacity is noted concerning for pneumonia or asymmetric edema. Electronically Signed   By: Lupita Raider, M.D.   On: 12/09/2015 13:23   Dg Abd Portable 1v  12/27/2015  CLINICAL DATA:  Abdominal distension, excessive stool EXAM: PORTABLE ABDOMEN - 1 VIEW COMPARISON:  None. FINDINGS: There is a relative paucity of bowel gas. There is no evidence of pneumoperitoneum, portal venous gas or pneumatosis. There are no pathologic calcifications along the expected course of the ureters. The osseous structures are unremarkable. IMPRESSION: Relative paucity of bowel gas which may reflect decompressed bowel versus fluid-filled bowel. Electronically Signed   By: Elige Ko   On: 12/27/2015 13:14     Medications:   . dextrose 30 mL/hr at 12/29/2015 0600  . fentaNYL infusion INTRAVENOUS 100 mcg/hr (01/08/2016 0625)  . norepinephrine (LEVOPHED) Adult infusion Stopped (12/17/2015 0612)  . propofol (DIPRIVAN) infusion 40 mcg/kg/min (12/09/2015 1610)   . antiseptic oral rinse  7 mL Mouth Rinse 10 times per day  . artificial tears   Both Eyes 3 times per day  . chlorhexidine gluconate  15 mL Mouth Rinse BID  . insulin aspart  0-15 Units Subcutaneous 6 times per day  . insulin glargine  8 Units Subcutaneous Daily  . ipratropium-albuterol  3 mL Nebulization Q6H  .  pantoprazole (PROTONIX) IV  40 mg Intravenous Q12H  . piperacillin-tazobactam (ZOSYN)  IV  3.375 g Intravenous Q12H  . sodium chloride  10-40 mL Intracatheter Q12H  . sodium chloride  3 mL Intravenous Q12H  . vancomycin  750 mg Intravenous Q T,Th,Sa-HD   acetaminophen **OR** acetaminophen, fentaNYL, sodium chloride  Assessment/ Plan:  40 yo AAF with h/o type 2 diabetes, HLD, HTN, CHF and ESRD on hemodialysis. Started hemodialysis in the hospital, Nov. 2016.   1. End-stage renal disease on hemodialysis Tuesday, Thursday, Saturday. For the most part electrolytes are acceptable. Sodium slightly low at 133 but this is better than yesterday. Potassium still low at 3.2. We will hold off on dialysis today and plan for dialysis again tomorrow.  2. Acute respiratory failure/PEA arrest. Patient had PEA arrest 2 upon presentation. S/p hypothermia protocol and rewarmed 12/20/2015.  Had posturing early on 12/29/2015. -  Continue ventilatory support at present. Cardiology following.  3. Anemia of chronic kidney disease. Hemoglobin currently 7.7. We will consider restarting Epogen with dialysis.  4. Secondary hyperparathyroidism. Check PTH as well as phosphorus with the next dialysis treatment.  5. Overall prognosis remains guarded.    LOS: 2 Vita Currin 1/20/20178:09 AM

## 2016-01-09 NOTE — Progress Notes (Signed)
[facility]link called to notify about pt's cbg of 49.  New orders given to stop sodium bicarb and start D5 at 30cc/hr. Will continue to assess

## 2016-01-09 NOTE — Progress Notes (Signed)
eLink Physician-Brief Progress Note Patient Name: Karen Rich DOB: January 18, 1976 MRN: [id number]   Date of Service  01/19/16  HPI/Events of Note  Patient with 2 episodes of hypoglycemia receiving D50 IV.  Had received lantus on 1/19 at noon but on no nutrition.  Has been receiving a bicarb infusion at 125 cc/hr and is ESRD.  Ph of 7.48.  Is net positive over 5 liters past 48 hours.  eICU Interventions  Plan: D/C bicarb gtt Start D5W at 30 cc/hr     Intervention Category Major Interventions: Acid-Base disturbance - evaluation and management Intermediate Interventions: Other:  Yeny Schmoll January 19, 2016, 12:49 AM

## 2016-01-09 NOTE — Progress Notes (Signed)
Pt received one bolus propofol 20 mcg/kg/min, one bolus Fentanyl . Propofol dose increased to 40 mcg/kg/min per orders.  Pt BP now 95/55. Will continue to assess

## 2016-01-09 NOTE — Progress Notes (Signed)
LE: Pt family at bedside. They have verbalized understanding of terminally extubating patient. Hospital chaplain involved, and family is talking with their personal minister for possible visit.

## 2016-01-09 NOTE — Discharge Summary (Addendum)
Shaune Pollack, MD Physician Addendum Internal Medicine Discharge Summaries 12/29/2015 8:30 PM         PATIENT NAME: Karen Rich   MR#: [id number]  DATE OF BIRTH: 04-Jan-1976  DATE OF ADMISSION: Jan 03, 2016 ADMITTING PHYSICIAN: Alford Highland DEATH DATE: Jan 05, 2016 PRIMARY CARE PHYSICIAN: Mosetta Pigeon, MD    SION DIAGNOSIS:SSION DIAGNOSIS:  ADMISSION DIAGNOSIS  1.  PEA cardiopulmonary arrest. 2. Clinical sepsis, pneumonia  3. Acute encephalopathy DISCHARGE DIAGNOSIS:   DISCHARGE DIAGNOSIS  1. Acute respiratory failure with hypoxia, with PEA cardiopulmonary arrest.  2. Sepsis, multifocal pneumonia 3. Acute encephalopathy, consistent with the possibility of significant brain injury.  SECONDARY DIAGNOSIS:   Past Medical History  Diagnosis Date  . Edema   . Hyperlipidemia   . Hyperglycemia   . Fatigue   . Hypertension   . Diabetes mellitus without complication (HCC)   . ESRD on hemodialysis (HCC)     T,T,S  . Chronic diastolic CHF (congestive heart failure) (HCC)     HOSPITAL COURSE:    HOSPITAL COURSE: 1. Acute respiratory failure with hypoxia, with PEA cardiopulmonary arrest.  troponin is negative. Off heparin drip due to bloody fluid from OG tube. off hypothermia protocol, was on vent.   2. Sepsis, multifocal pneumonia, leukocytosis and tachycardia.  She was treated with Zosyn and vancomycin.  3. Acute encephalopathy. 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  a markedly abnormal electroencephalogram, consistent with the possibility of significant brain injury.   4. End-stage renal disease on dialysis Tuesday Thursday and Saturday.   5. Hypotension. Improved, off Levophed drip 6. Type 2 diabetes, on sliding-scale 7. History of stridor 8. Chronic diastolic congestive heart failure. Hod HF meds per Cardiology.  * Hypokalemia. Improved with KCl supplement. * Anemia of chronic disease. Hb down to 7.2, and up to 7.7. *  Diarrhea. Pt had a lot diarrhea last night. But CDT is negative.  * GIB. Bloody fluid from OGT. Treated with protonix iv bid.   The patient's sister (POA) and brother decided terminal extubation after discussion with Dr. Nicholos Johns. The patient was made DNR. She was terminally extubated and expirted at 6:13 pm, 2016-01-05. DISCHARGE CONDITIONS:  DISCHARGE CONDITION Expired.   Shaune Pollack M.D on 12/29/2015 at 8:03 PM  Between 7am to 6pm - Pager - [phone]  After 6pm go to [email or web address] - password [email or web address] [facility]  [facility]  Office [phone]  CC: Primary care physician; Mosetta Pigeon, MD

## 2016-01-09 NOTE — Progress Notes (Signed)
[facility] [place] at [facility]   PATIENT NAME: Karen Rich    MR#:  [id number]  DATE OF BIRTH:  [birth year]  SUBJECTIVE:  CHIEF COMPLAINT:   Chief Complaint  Patient presents with  . Cardiac Arrest  on vent, sedation,  off hypothermia. Hypoglycemia last night, put on D5 iv.  REVIEW OF SYSTEMS:  Unable to get ROS.  DRUG ALLERGIES:  No Known Allergies  VITALS:  Blood pressure 217/194, pulse 88, temperature 98.4 F (36.9 C), temperature source Core (Comment), resp. rate 24, weight 74.889 kg (165 lb 1.6 oz), SpO2 100 %.  PHYSICAL EXAMINATION:  GENERAL:  40 y.o.-year-old patient lying in the bed with intubation on vent.  EYES: Pupils equal, pinpoint, not reactive to light. No scleral icterus.  HEENT: Head atraumatic, normocephalic.  NECK:  Supple, no jugular venous distention. No thyroid enlargement, no tenderness.  LUNGS: Normal breath sounds bilaterally, no wheezing, but has crakcles and rhonchi.   CARDIOVASCULAR: S1, S2 normal. No murmurs, rubs, or gallops.  ABDOMEN: Soft, nondistended. Bowel sounds present but hypoactive.  EXTREMITIES: No pedal edema, cyanosis, or clubbing.  NEUROLOGIC: unable to exam. PSYCHIATRIC: The patient is on vent and sedation. SKIN: No obvious rash, lesion, or ulcer.    LABORATORY PANEL:   CBC  Recent Labs Lab Jan 21, 2016 0432  WBC 13.3*  HGB 7.7*  HCT 24.1*  PLT 230   ------------------------------------------------------------------------------------------------------------------  Chemistries   Recent Labs Lab 12/10/2015 1237  01-21-16 0250  01/21/2016 1004  NA 137  < > 132*  < > 136  K 4.5  < > 3.0*  < > 4.0  CL 105  < > 94*  < > 96*  CO2 22  < > 29  < > 30  GLUCOSE 149*  < > 90  < > 76  BUN 23*  < > 35*  < > 40*  CREATININE 4.36*  < > 4.68*  < > 5.06*  CALCIUM 12.3*  < > 8.0*  < > 8.3*  MG  --   < > 1.4*  --   --   AST 51*  --   --   --   --   ALT 41  --   --   --   --   ALKPHOS 170*  --   --    --   --   BILITOT 0.8  --   --   --   --   < > = values in this interval not displayed. ------------------------------------------------------------------------------------------------------------------  Cardiac Enzymes  Recent Labs Lab 12/27/15 2037  TROPONINI 0.03   ------------------------------------------------------------------------------------------------------------------  RADIOLOGY:  Dg Chest 1 View  2016-01-21  CLINICAL DATA:  Shortness of breath . EXAM: CHEST 1 VIEW COMPARISON:  12/27/2015. FINDINGS: Endotracheal tube, NG tube, right IJ line, and left IJ dialysis catheter in stable position. Mediastinum hilar structures are unremarkable. Cardiomegaly. Interim slight improvement of bibasilar atelectasis and/or infiltrates. Small pleural effusions cannot be excluded. No pneumothorax . IMPRESSION: 1. Lines and tubes in stable position. 2. Unchanged cardiomegaly. No pulmonary venous congestion. Interim clearing of pulmonary interstitial edema. Small pleural effusions the cannot be excluded . 3. Persistent but slightly improved basilar atelectasis . Electronically Signed   By: Maisie Fus  Register   On: 01-21-2016 07:45   Dg Chest 1 View  01/08/2016  CLINICAL DATA:  Central catheter placement EXAM: CHEST 1 VIEW COMPARISON:  Study obtained earlier in the day FINDINGS: There is a new right-sided central catheter with the tip just beyond the  cavoatrial junction in the right atrium. Left-sided central catheter tip is at the cavoatrial junction. Endotracheal tube tip is 1.3 cm above the carina. Nasogastric tube tip and side port are below the diaphragm. No pneumothorax. Airspace consolidation in both upper lobes is present, more severe on the right than on the left, not significantly changed. No new opacity. Heart prominent but stable. There is apparent calcification in the left carotid artery. IMPRESSION: Tube and catheter positions as described without pneumothorax. Note that the endotracheal tube  tip is 1.3 cm above the carina. It may be prudent to consider withdrawing endotracheal tube approximately 2 cm. Airspace opacity in both upper lobes is again noted. Suspect multifocal pneumonia, although pulmonary edema could present in this manner. Cardiac prominence is stable. Apparent calcification left carotid artery. Electronically Signed   By: Bretta Bang III M.D.   On: 01-24-16 16:05   Ct Head Wo Contrast  12/10/2015  CLINICAL DATA:  Anoxic brain damage.  Cardiac arrest January 24, 2016. EXAM: CT HEAD WITHOUT CONTRAST TECHNIQUE: Contiguous axial images were obtained from the base of the skull through the vertex without intravenous contrast. COMPARISON:  CT head 01/24/2016 FINDINGS: Loss of gray-white differentiation diffusely and bilaterally. Effacement of the sulci diffusely has progressed. Findings suggest diffuse brain edema. Ventricles are slightly smaller due to brain edema. No hydrocephalus. Hypodensity in the base ganglia bilaterally have progressed since likely due to acute ischemia. Negative for hemorrhage. Negative for mass lesion. No shift of the midline structures. Calvarium intact. 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 No hemorrhage. Critical Value/emergent results were called by telephone at the time of interpretation on 12/15/2015 at 9:14 am to Dr. Nicholos Johns, who verbally acknowledged these results. Electronically Signed   By: Marlan Palau M.D.   On: 01/03/2016 09:15   Ct Head Wo Contrast  01/24/2016  CLINICAL DATA:  Status post cardiopulmonary arrest today. History was not able to be obtained. Initial encounter. EXAM: CT HEAD WITHOUT CONTRAST TECHNIQUE: Contiguous axial images were obtained from the base of the skull through the vertex without intravenous contrast. COMPARISON:  Head CT scan 10/18/2015. FINDINGS: The brain appears normal without hemorrhage, infarct, mass lesion, mass effect or midline  shift. No hemorrhage or abnormal extra-axial fluid collection is identified. Gray-white differentiation is maintained. The calvarium is intact. Imaged paranasal sinuses and mastoid air cells are clear. IMPRESSION: Negative head CT. Electronically Signed   By: Drusilla Kanner M.D.   On: 01-24-16 15:06   Dg Chest Port 1 View  12/27/2015  CLINICAL DATA:  PNA, Pt intubated, hx CHF, DM, HTN, dialysis pt, nonsmoker, pt unable to follow breathing instructions EXAM: PORTABLE CHEST 1 VIEW COMPARISON:  24-Jan-2016 FINDINGS: Endotracheal tube is in place with tip estimated to be in 4.8 cm above the carina. Right IJ central line tip overlies the level of superior vena cava. Nasogastric tube is in place, tip beyond the gastroesophageal junction and off the film. Left IJ central line tip overlies the level of superior vena cava. Heart is enlarged. There has been a shift in the pattern of infiltrates, now predominantly involving the left lung base and diffusely involving the right lung. The diaphragms are partially or fully obscured. IMPRESSION: Significant airspace filling opacity with decreased aeration at the bases. Findings are consistent with pulmonary edema and/or infectious process. Electronically Signed   By: Norva Pavlov M.D.   On: 12/27/2015 13:14   Dg Chest Portable 1 View  01-24-16  CLINICAL DATA:  Unresponsive.  Status post intubation. EXAM: PORTABLE CHEST 1 VIEW COMPARISON:  October 20, 2015. FINDINGS: Stable cardiomegaly. Endotracheal tube is noted with distal tip 2.5 cm above the carina. No pneumothorax or pleural effusion is noted. Left internal jugular dialysis catheter is noted. Left lung is clear. Large right upper lobe airspace opacity is noted concerning for pneumonia or edema. Nasogastric tube is seen entering stomach. IMPRESSION: Endotracheal and nasogastric tubes are in grossly good position. Large right upper lobe airspace opacity is noted concerning for pneumonia or asymmetric edema.  Electronically Signed   By: Lupita Raider, M.D.   On: 30-Dec-2015 13:23   Dg Abd Portable 1v  12/27/2015  CLINICAL DATA:  Abdominal distension, excessive stool EXAM: PORTABLE ABDOMEN - 1 VIEW COMPARISON:  None. FINDINGS: There is a relative paucity of bowel gas. There is no evidence of pneumoperitoneum, portal venous gas or pneumatosis. There are no pathologic calcifications along the expected course of the ureters. The osseous structures are unremarkable. IMPRESSION: Relative paucity of bowel gas which may reflect decompressed bowel versus fluid-filled bowel. Electronically Signed   By: Elige Ko   On: 12/27/2015 13:14    EKG:   Orders placed or performed during the hospital encounter of 12-30-15  . EKG 12-Lead  . EKG 12-Lead    ASSESSMENT AND PLAN:   1. Acute respiratory failure with hypoxia, with PEA cardiopulmonary arrest.   troponin is negative.  Off heparin drip due to bloody fluid from OG tube.  off hypothermia, continue vent.   2. Sepsis, multifocal pneumonia, leukocytosis and tachycardia.  continue Zosyn and vancomycin, f/u blood cultures.f/u CBC.  3. Acute encephalopathy. 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  a markedly abnormal electroencephalogram, consistent with the possibility of significant brain injury.   4. End-stage renal disease on dialysis Tuesday Thursday and Saturday.   F/u Dr. Cherylann Ratel.  5. Hypotension. Improved, off Levophed drip 6. Type 2 diabetes, on sliding-scale 7. History of stridor was then a follow-up with ENT as outpatient 8. Chronic diastolic congestive heart failure. Hod HF meds per Cardiology.  * Hypokalemia. Improved with KCl supplement. * Anemia of chronic disease. Hb down to 7.2, and up to 7.7 today,  PRBC transfusion prn. * Diarrhea. Pt had a lot diarrhea last night. But CDT is negative.  * GIB. Bloody fluid from OGT. protonix iv bid. F/u Hb.   Pt is very critical and has very poor prognosis. Discussed with Dr. Nicholos Johns and Dr.  Cherylann Ratel. All the records are reviewed and case discussed with Care Management/Social Workerr. Management plans discussed with the patient, family and they are in agreement.  CODE STATUS: full code.  TOTAL CRITICAL TIME TAKING CARE OF THIS PATIENT: 42 minutes.  Greater than 50% time was spent on coordination of care and face-to-face counseling.  POSSIBLE D/C IN ? DAYS, DEPENDING ON CLINICAL CONDITION.   Shaune Pollack M.D on 12/23/2015 at 10:44 AM  Between 7am to 6pm - Pager - [phone]  After 6pm go to [email or web address] - password [email or web address]  [facility]  Office  [phone]  CC: Primary care physician; Mosetta Pigeon, MD

## 2016-01-09 DEATH — deceased

## 2016-01-18 ENCOUNTER — Ambulatory Visit: Payer: Self-pay | Admitting: Family Medicine

## 2016-03-20 ENCOUNTER — Ambulatory Visit: Payer: Medicaid Other | Admitting: Family

## 2016-03-21 ENCOUNTER — Ambulatory Visit: Payer: Medicaid Other | Admitting: Family

## 2016-11-05 IMAGING — CR DG CHEST 1V
1 series · 1 of 1 positions shown · non-contrast
Comparison: Study obtained earlier in the day

CLINICAL DATA: Central catheter placement

EXAM:
CHEST 1 VIEW

[ap]
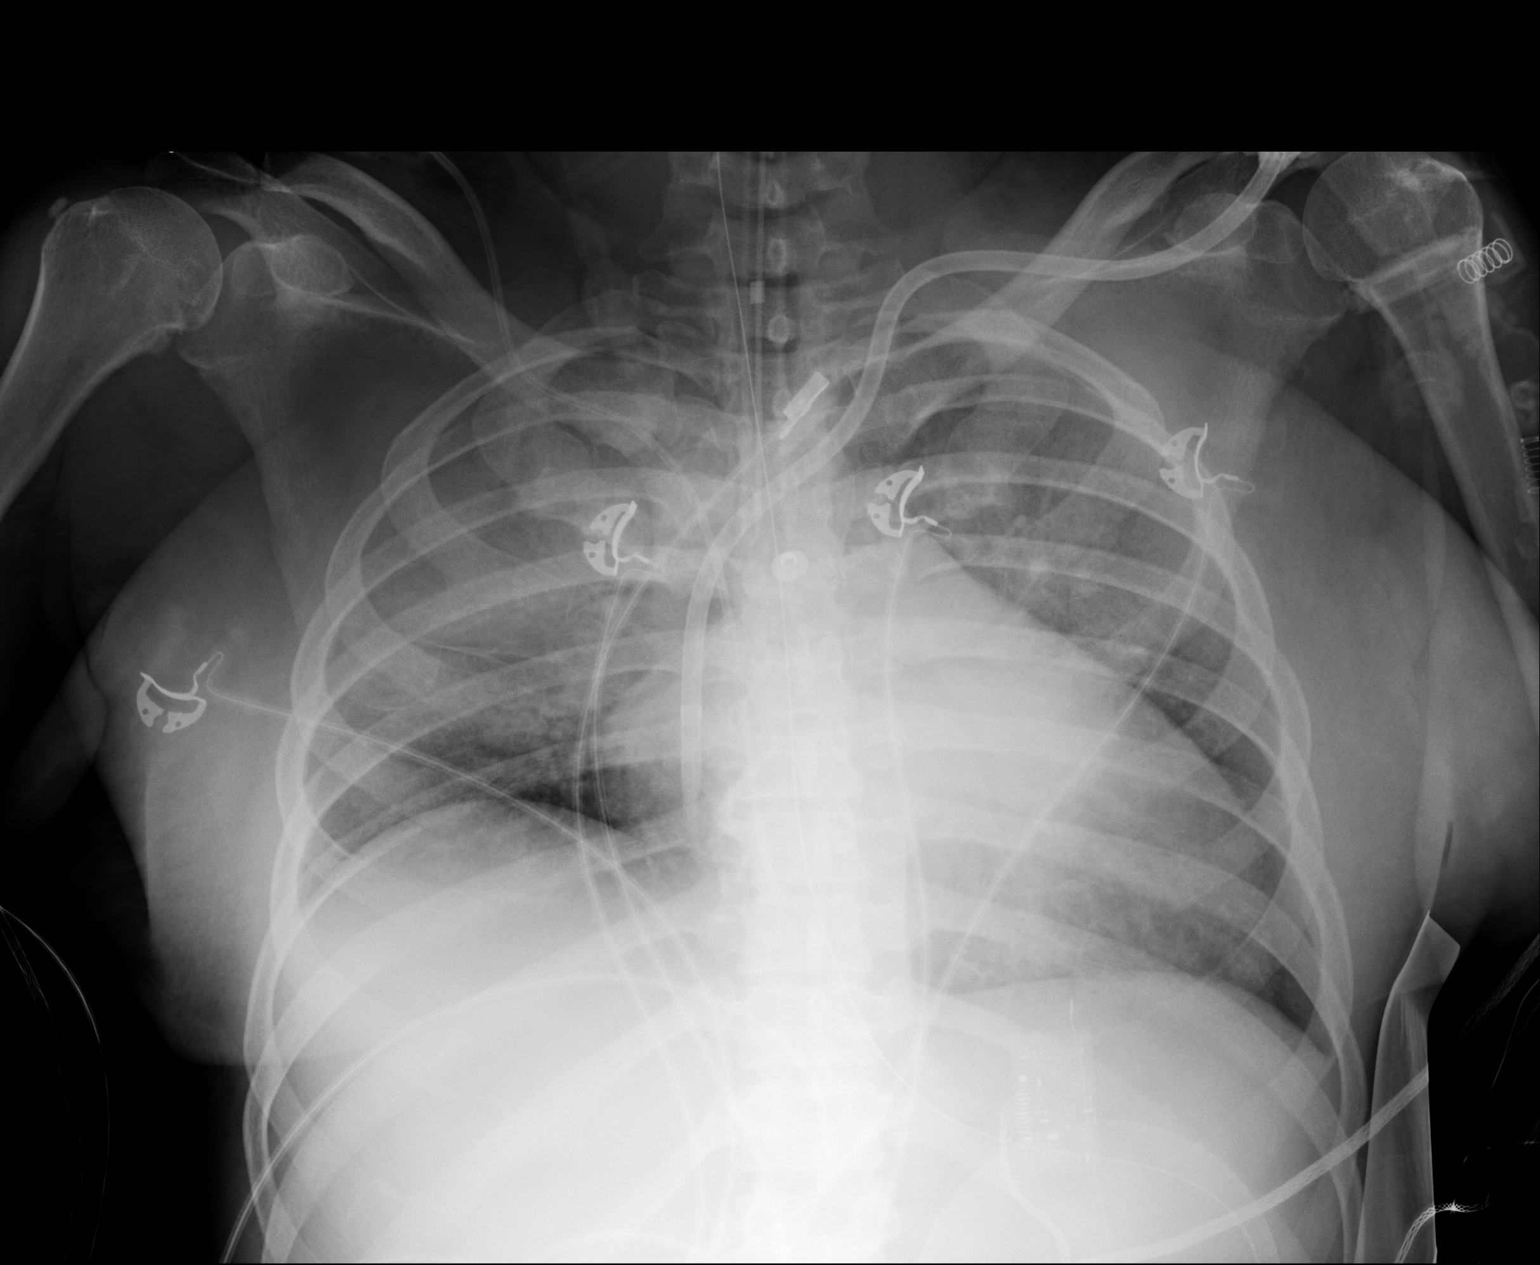

[1 of 1 positions shown; findings below may reference images not displayed]

FINDINGS: There is a new right-sided central catheter with the tip just beyond
the cavoatrial junction in the right atrium. Left-sided central
catheter tip is at the cavoatrial junction. Endotracheal tube tip is
1.3 cm above the carina. Nasogastric tube tip and side port are
below the diaphragm. No pneumothorax. Airspace consolidation in both
upper lobes is present, more severe on the right than on the left,
not significantly changed. No new opacity. Heart prominent but
stable. There is apparent calcification in the left carotid artery.
IMPRESSION: Tube and catheter positions as described without pneumothorax. Note
that the endotracheal tube tip is 1.3 cm above the carina. It may be
prudent to consider withdrawing endotracheal tube approximately 2
cm. Airspace opacity in both upper lobes is again noted. Suspect
multifocal pneumonia, although pulmonary edema could present in this
manner. Cardiac prominence is stable. Apparent calcification left
carotid artery.

## 2016-11-05 IMAGING — CR DG CHEST 1V PORT
1 series · 1 of 1 positions shown · non-contrast
Comparison: October 20, 2015.

CLINICAL DATA: Unresponsive.  Status post intubation.

EXAM:
PORTABLE CHEST 1 VIEW

[ap]
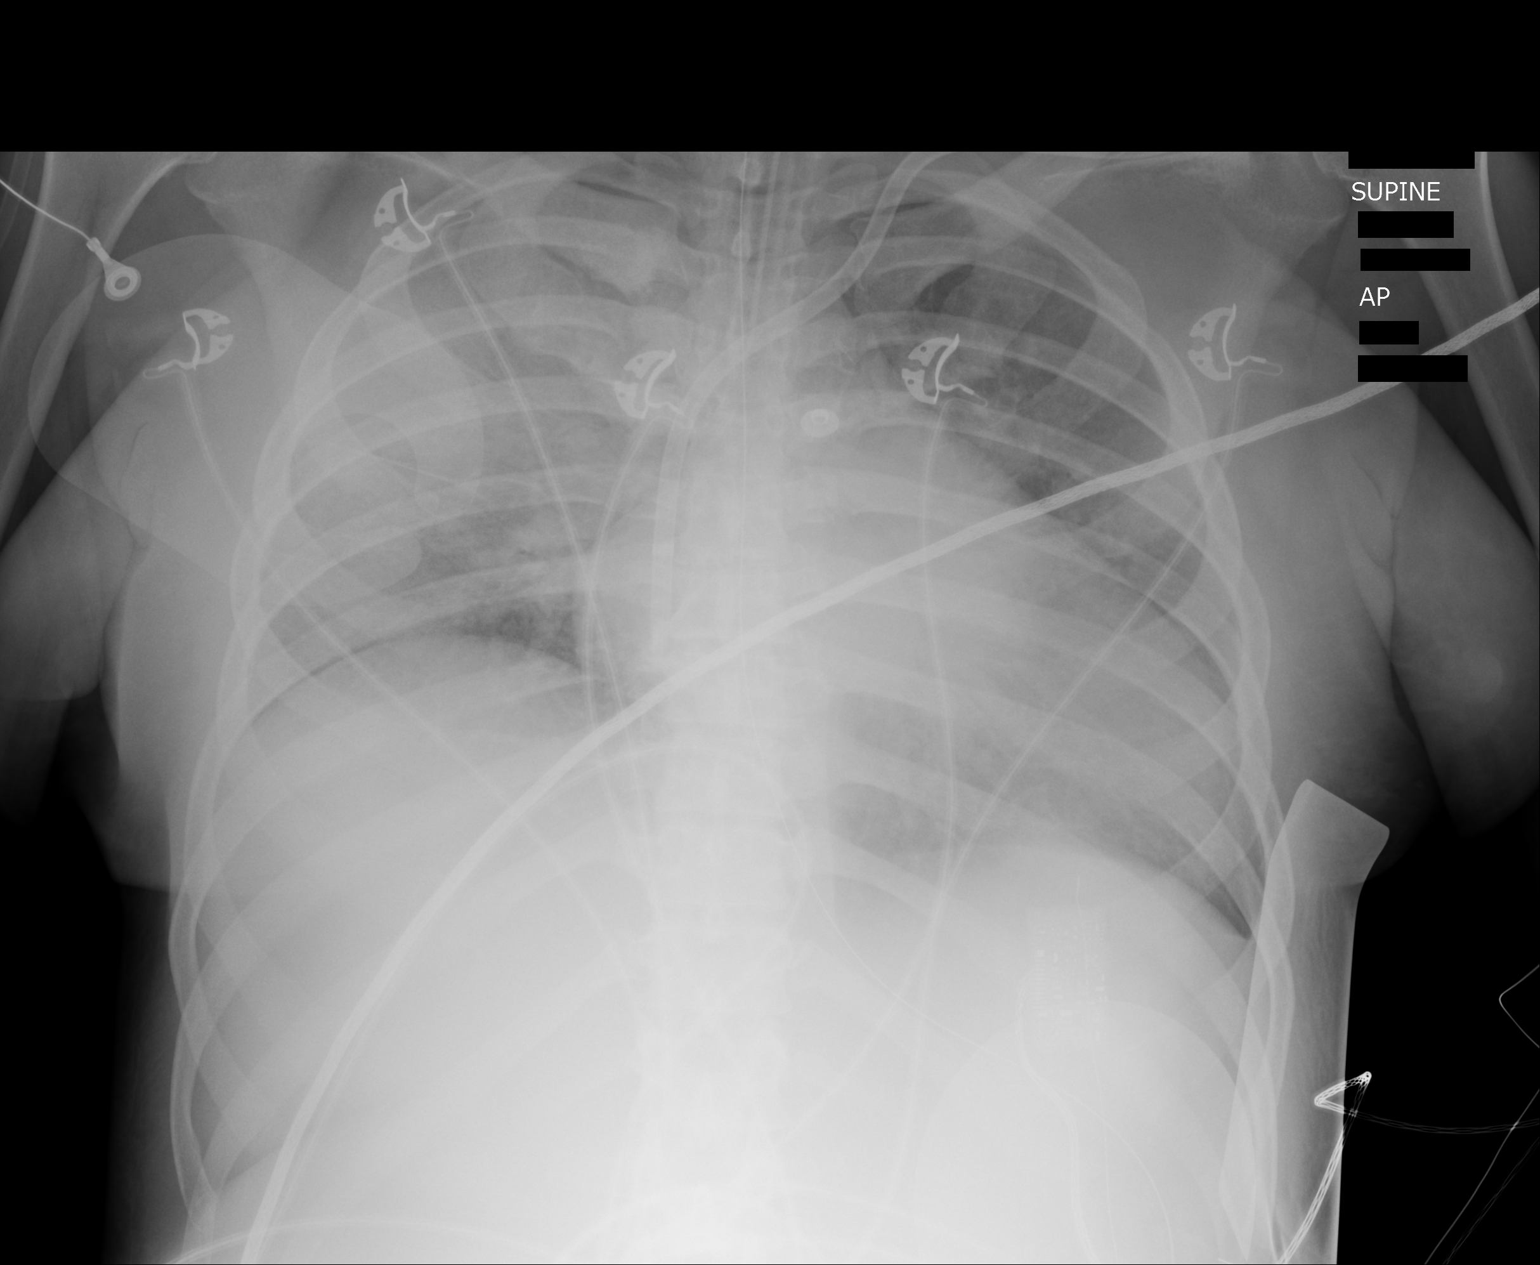

[1 of 1 positions shown; findings below may reference images not displayed]

FINDINGS: Stable cardiomegaly. Endotracheal tube is noted with distal tip
cm above the carina. No pneumothorax or pleural effusion is noted.
Left internal jugular dialysis catheter is noted. Left lung is
clear. Large right upper lobe airspace opacity is noted concerning
for pneumonia or edema. Nasogastric tube is seen entering stomach.
IMPRESSION: Endotracheal and nasogastric tubes are in grossly good position.
Large right upper lobe airspace opacity is noted concerning for
pneumonia or asymmetric edema.

## 2016-11-07 IMAGING — CT CT HEAD W/O CM
1 series · 16 of 30 positions shown, 20 images · non-contrast
Comparison: CT head 12/26/2015

CLINICAL DATA: Anoxic brain damage.  Cardiac arrest 12/26/2015.

EXAM:
CT HEAD WITHOUT CONTRAST
TECHNIQUE: Contiguous axial images were obtained from the base of the skull
through the vertex without intravenous contrast.

[Series 2: head wo · axial · 0.40mm/px · z∈[-124,+12]mm · 16 of 31 slices shown, 20 images]
[im 2/31  brain]
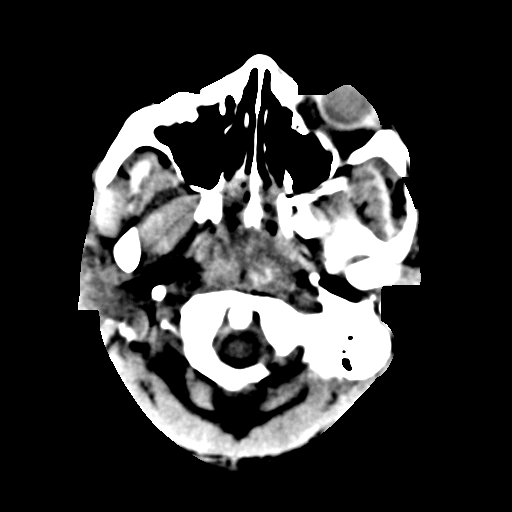
[im 2/31  bone]
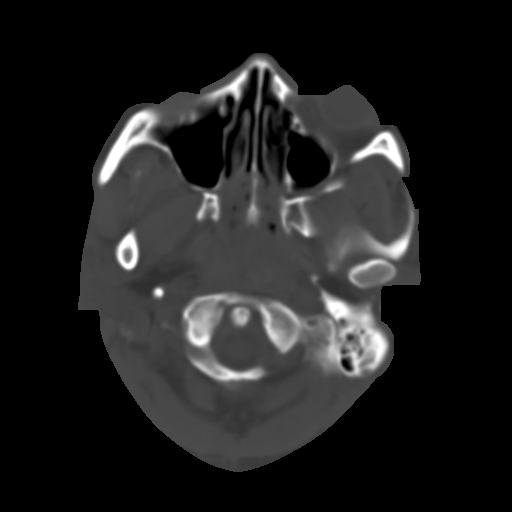
[im 4/31  brain]
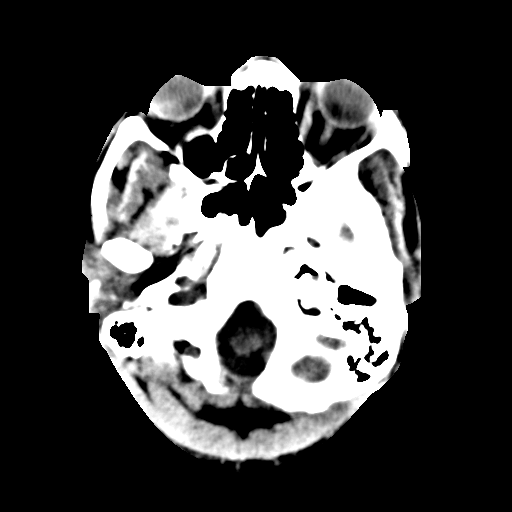
[im 6/31  brain]
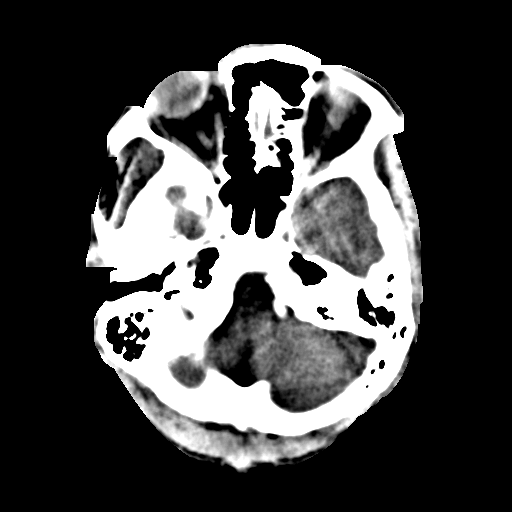
[im 8/31  brain]
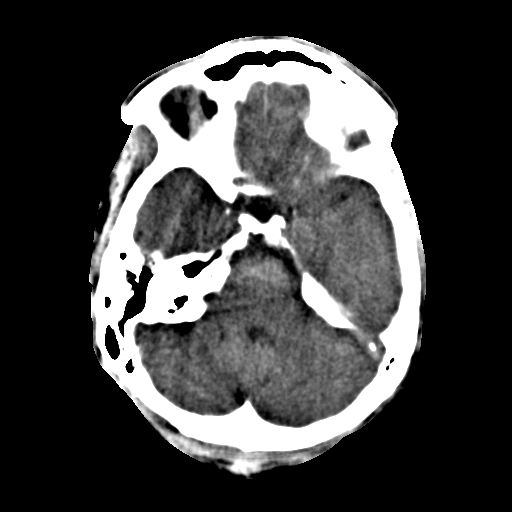
[im 9/31  brain]
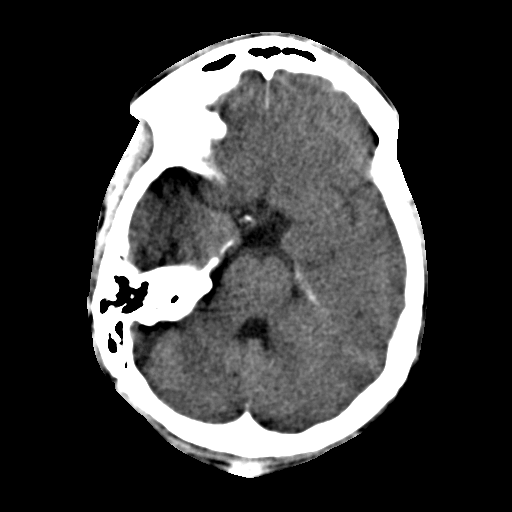
[im 9/31  bone]
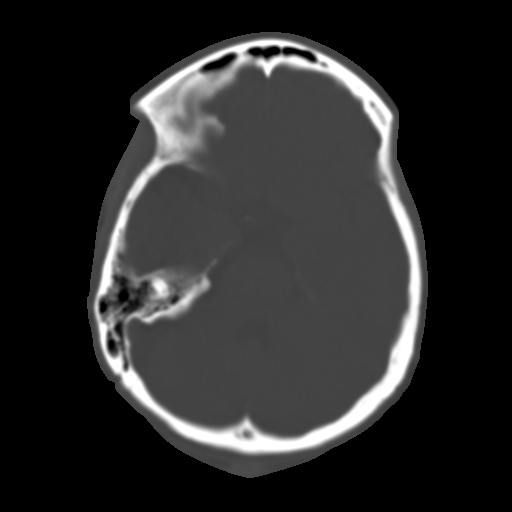
[im 11/31  brain]
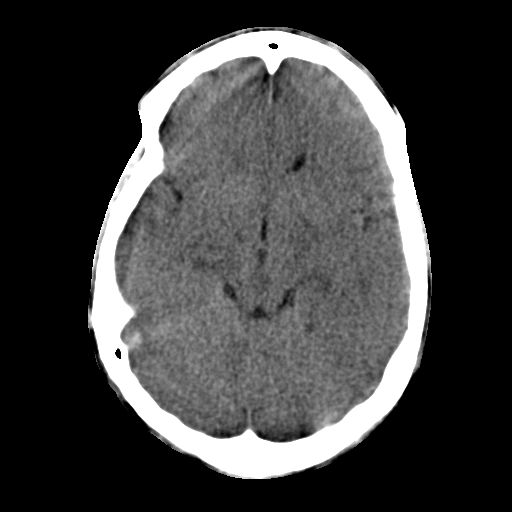
[im 13/31  brain]
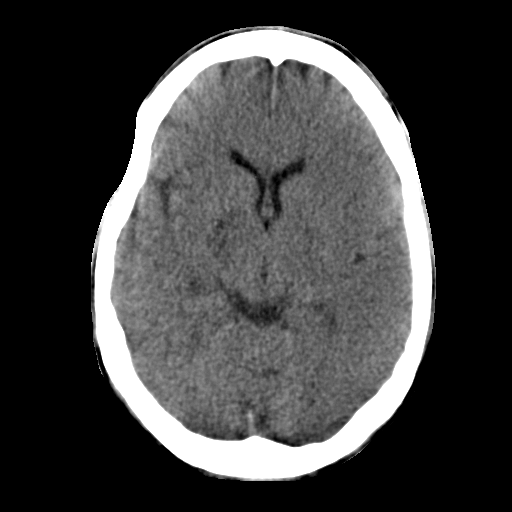
[im 15/31  brain]
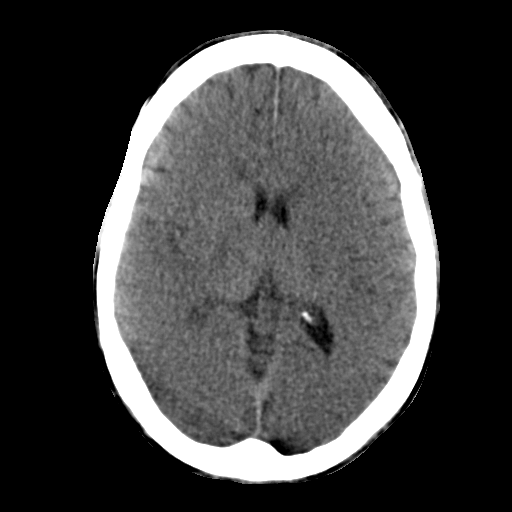
[im 16/31  brain]
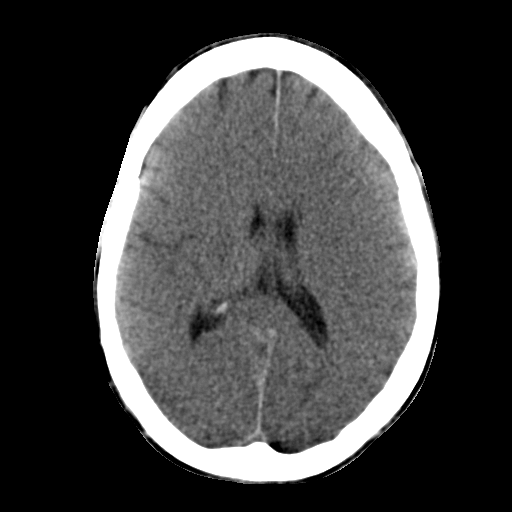
[im 16/31  bone]
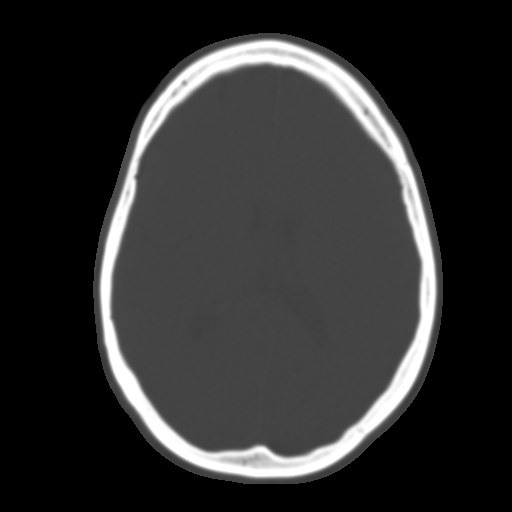
[im 18/31  brain]
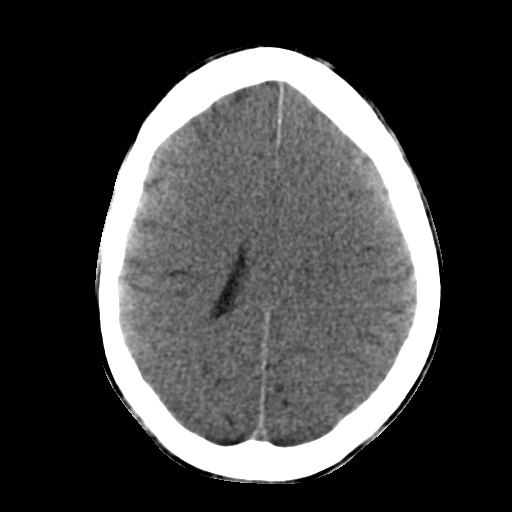
[im 20/31  brain]
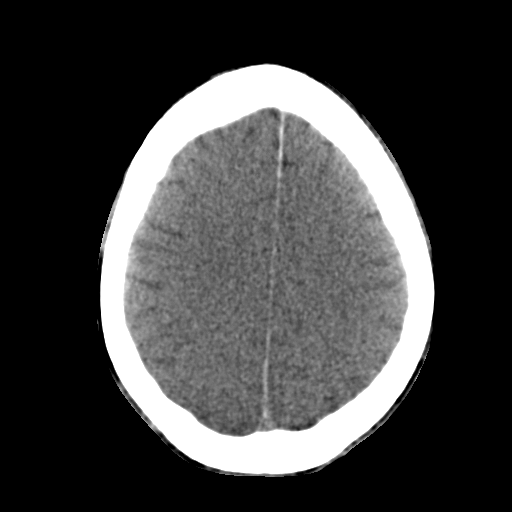
[im 22/31  brain]
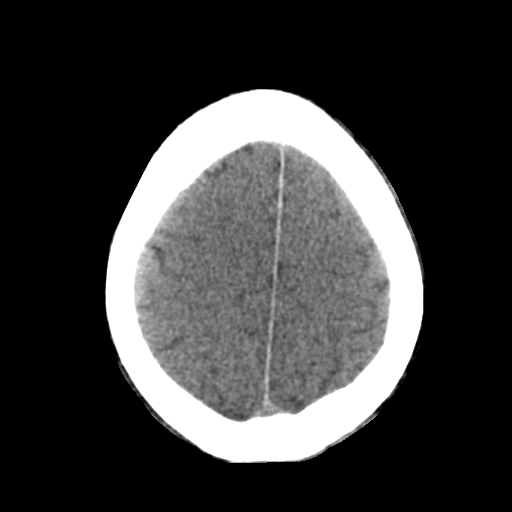
[im 23/31  brain]
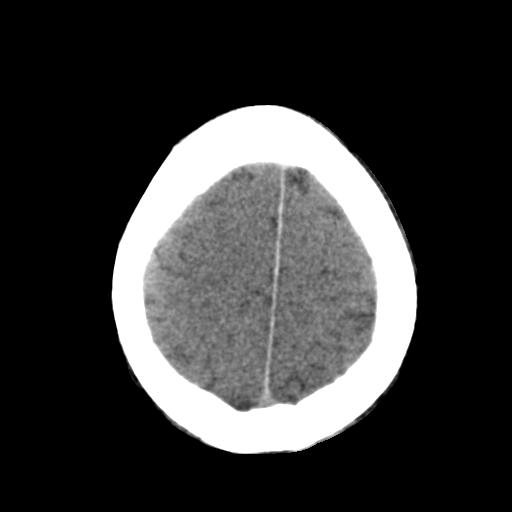
[im 23/31  bone]
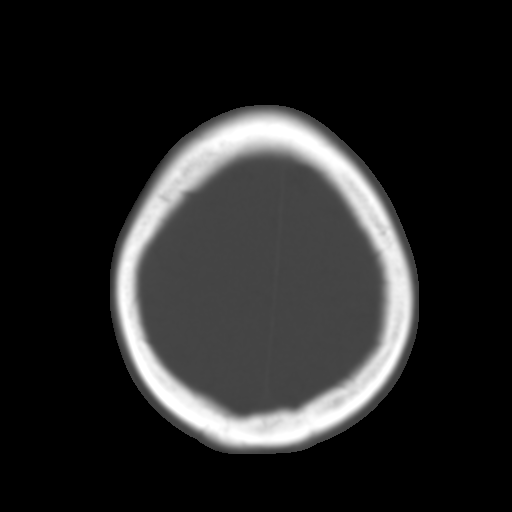
[im 25/31  brain]
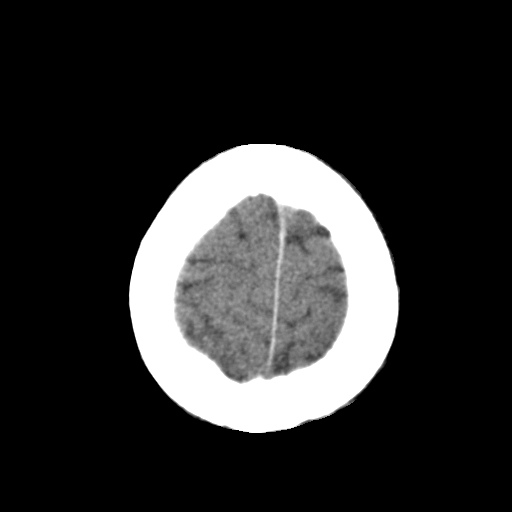
[im 27/31  brain]
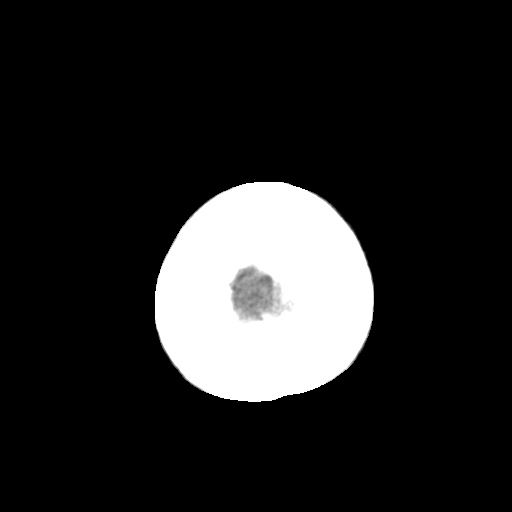
[im 29/31  brain]
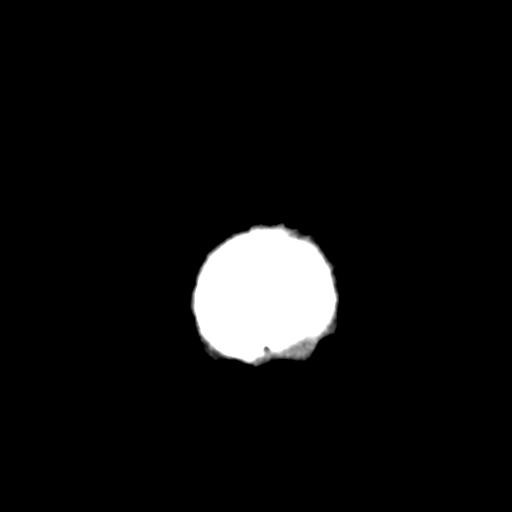

[16 of 30 positions shown; findings below may reference images not displayed]

FINDINGS: Loss of gray-white differentiation diffusely and bilaterally.
Effacement of the sulci diffusely has progressed. Findings suggest
diffuse brain edema. Ventricles are slightly smaller due to brain
edema. No hydrocephalus.

Hypodensity in the base ganglia bilaterally have progressed since
likely due to acute ischemia.

Negative for hemorrhage. Negative for mass lesion. No shift of the
midline structures.

Calvarium intact.
IMPRESSION: Findings are consistent with diffuse anoxic brain injury. Acute
infarction in the basal ganglia bilaterally. No hemorrhage.

Critical Value/emergent results were called by telephone at the time
of interpretation on 12/28/2015 at [DATE] to Dr. Nomasibulele, who
verbally acknowledged these results.

## 2016-12-05 NOTE — Telephone Encounter (Signed)
Error. Closing encounter
